# Patient Record
Sex: Female | Born: 1975 | Race: Black or African American | Hispanic: No | Marital: Single | Smoking: Never smoker
Health system: Southern US, Community
[De-identification: ages and names within clinical notes are randomized; demographics above are authoritative.]

## PROBLEM LIST (undated history)

## (undated) ENCOUNTER — Inpatient Hospital Stay (HOSPITAL_COMMUNITY): Payer: Self-pay

## (undated) DIAGNOSIS — N809 Endometriosis, unspecified: Secondary | ICD-10-CM

## (undated) DIAGNOSIS — R519 Headache, unspecified: Secondary | ICD-10-CM

## (undated) DIAGNOSIS — D219 Benign neoplasm of connective and other soft tissue, unspecified: Secondary | ICD-10-CM

## (undated) DIAGNOSIS — D649 Anemia, unspecified: Secondary | ICD-10-CM

## (undated) DIAGNOSIS — N83209 Unspecified ovarian cyst, unspecified side: Secondary | ICD-10-CM

## (undated) DIAGNOSIS — N939 Abnormal uterine and vaginal bleeding, unspecified: Secondary | ICD-10-CM

## (undated) DIAGNOSIS — Z789 Other specified health status: Secondary | ICD-10-CM

## (undated) DIAGNOSIS — K8020 Calculus of gallbladder without cholecystitis without obstruction: Secondary | ICD-10-CM

## (undated) DIAGNOSIS — R51 Headache: Secondary | ICD-10-CM

## (undated) HISTORY — PX: FOOT SURGERY: SHX648

## (undated) HISTORY — DX: Anemia, unspecified: D64.9

## (undated) HISTORY — PX: ABDOMINAL HYSTERECTOMY: SHX81

## (undated) HISTORY — DX: Endometriosis, unspecified: N80.9

## (undated) HISTORY — PX: WISDOM TOOTH EXTRACTION: SHX21

## (undated) HISTORY — DX: Abnormal uterine and vaginal bleeding, unspecified: N93.9

## (undated) HISTORY — DX: Benign neoplasm of connective and other soft tissue, unspecified: D21.9

## (undated) HISTORY — PX: TUBAL LIGATION: SHX77

---
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
## 1999-01-06 ENCOUNTER — Other Ambulatory Visit: Admission: RE | Admit: 1999-01-06 | Discharge: 1999-01-06 | Payer: Self-pay | Admitting: Obstetrics

## 1999-01-31 ENCOUNTER — Inpatient Hospital Stay (HOSPITAL_COMMUNITY): Admission: AD | Admit: 1999-01-31 | Discharge: 1999-01-31 | Payer: Self-pay | Admitting: Obstetrics

## 1999-04-24 ENCOUNTER — Inpatient Hospital Stay (HOSPITAL_COMMUNITY): Admission: AD | Admit: 1999-04-24 | Discharge: 1999-04-27 | Payer: Self-pay | Admitting: Obstetrics

## 1999-04-25 ENCOUNTER — Encounter: Payer: Self-pay | Admitting: Obstetrics

## 1999-07-01 ENCOUNTER — Inpatient Hospital Stay (HOSPITAL_COMMUNITY): Admission: AD | Admit: 1999-07-01 | Discharge: 1999-07-01 | Payer: Self-pay | Admitting: Obstetrics

## 1999-07-15 ENCOUNTER — Inpatient Hospital Stay (HOSPITAL_COMMUNITY): Admission: AD | Admit: 1999-07-15 | Discharge: 1999-07-15 | Payer: Self-pay | Admitting: Obstetrics

## 1999-07-21 ENCOUNTER — Inpatient Hospital Stay (HOSPITAL_COMMUNITY): Admission: AD | Admit: 1999-07-21 | Discharge: 1999-07-21 | Payer: Self-pay | Admitting: Obstetrics

## 1999-08-26 ENCOUNTER — Inpatient Hospital Stay (HOSPITAL_COMMUNITY): Admission: AD | Admit: 1999-08-26 | Discharge: 1999-08-28 | Payer: Self-pay | Admitting: Obstetrics

## 2002-07-08 ENCOUNTER — Emergency Department (HOSPITAL_COMMUNITY): Admission: EM | Admit: 2002-07-08 | Discharge: 2002-07-08 | Payer: Self-pay | Admitting: Emergency Medicine

## 2002-07-08 ENCOUNTER — Encounter: Payer: Self-pay | Admitting: Emergency Medicine

## 2003-02-01 ENCOUNTER — Emergency Department (HOSPITAL_COMMUNITY): Admission: EM | Admit: 2003-02-01 | Discharge: 2003-02-01 | Payer: Self-pay | Admitting: Emergency Medicine

## 2003-02-16 ENCOUNTER — Emergency Department (HOSPITAL_COMMUNITY): Admission: EM | Admit: 2003-02-16 | Discharge: 2003-02-17 | Payer: Self-pay | Admitting: Emergency Medicine

## 2003-02-17 ENCOUNTER — Encounter: Payer: Self-pay | Admitting: Emergency Medicine

## 2005-10-06 ENCOUNTER — Emergency Department (HOSPITAL_COMMUNITY): Admission: EM | Admit: 2005-10-06 | Discharge: 2005-10-06 | Payer: Self-pay | Admitting: Emergency Medicine

## 2011-02-27 ENCOUNTER — Other Ambulatory Visit (HOSPITAL_COMMUNITY): Payer: Self-pay | Admitting: Obstetrics

## 2011-02-27 DIAGNOSIS — Z1231 Encounter for screening mammogram for malignant neoplasm of breast: Secondary | ICD-10-CM

## 2011-02-27 DIAGNOSIS — D219 Benign neoplasm of connective and other soft tissue, unspecified: Secondary | ICD-10-CM

## 2011-03-08 ENCOUNTER — Ambulatory Visit (HOSPITAL_COMMUNITY)
Admission: RE | Admit: 2011-03-08 | Discharge: 2011-03-08 | Disposition: A | Discharge status: Home / Self Care | Payer: BC Managed Care – PPO | Source: Ambulatory Visit | Attending: Obstetrics | Admitting: Obstetrics

## 2011-03-08 DIAGNOSIS — D219 Benign neoplasm of connective and other soft tissue, unspecified: Secondary | ICD-10-CM

## 2011-03-08 DIAGNOSIS — Z1231 Encounter for screening mammogram for malignant neoplasm of breast: Secondary | ICD-10-CM | POA: Insufficient documentation

## 2011-03-14 ENCOUNTER — Other Ambulatory Visit (HOSPITAL_COMMUNITY): Payer: Self-pay | Admitting: Obstetrics

## 2011-03-14 DIAGNOSIS — N83202 Unspecified ovarian cyst, left side: Secondary | ICD-10-CM

## 2011-03-14 DIAGNOSIS — D219 Benign neoplasm of connective and other soft tissue, unspecified: Secondary | ICD-10-CM

## 2011-04-19 ENCOUNTER — Ambulatory Visit (HOSPITAL_COMMUNITY): Payer: BC Managed Care – PPO

## 2011-04-21 ENCOUNTER — Ambulatory Visit (HOSPITAL_COMMUNITY)
Admission: RE | Admit: 2011-04-21 | Discharge: 2011-04-21 | Disposition: A | Discharge status: Home / Self Care | Payer: BC Managed Care – PPO | Source: Ambulatory Visit | Attending: Obstetrics | Admitting: Obstetrics

## 2011-04-21 ENCOUNTER — Other Ambulatory Visit (HOSPITAL_COMMUNITY): Payer: Self-pay | Admitting: Obstetrics

## 2011-04-21 DIAGNOSIS — N83202 Unspecified ovarian cyst, left side: Secondary | ICD-10-CM

## 2011-04-21 DIAGNOSIS — D219 Benign neoplasm of connective and other soft tissue, unspecified: Secondary | ICD-10-CM

## 2011-04-21 DIAGNOSIS — N83209 Unspecified ovarian cyst, unspecified side: Secondary | ICD-10-CM | POA: Insufficient documentation

## 2011-04-21 DIAGNOSIS — O3410 Maternal care for benign tumor of corpus uteri, unspecified trimester: Secondary | ICD-10-CM | POA: Insufficient documentation

## 2011-04-21 DIAGNOSIS — O34599 Maternal care for other abnormalities of gravid uterus, unspecified trimester: Secondary | ICD-10-CM | POA: Insufficient documentation

## 2011-05-11 ENCOUNTER — Encounter (HOSPITAL_COMMUNITY): Payer: Self-pay | Admitting: Obstetrics and Gynecology

## 2011-05-11 ENCOUNTER — Inpatient Hospital Stay (HOSPITAL_COMMUNITY)
Admission: AD | Admit: 2011-05-11 | Discharge: 2011-05-11 | Disposition: A | Discharge status: Home / Self Care | Payer: BC Managed Care – PPO | Source: Ambulatory Visit | Attending: Obstetrics | Admitting: Obstetrics

## 2011-05-11 DIAGNOSIS — O3410 Maternal care for benign tumor of corpus uteri, unspecified trimester: Secondary | ICD-10-CM

## 2011-05-11 DIAGNOSIS — D259 Leiomyoma of uterus, unspecified: Secondary | ICD-10-CM

## 2011-05-11 DIAGNOSIS — O219 Vomiting of pregnancy, unspecified: Secondary | ICD-10-CM

## 2011-05-11 DIAGNOSIS — E860 Dehydration: Secondary | ICD-10-CM | POA: Insufficient documentation

## 2011-05-11 DIAGNOSIS — O211 Hyperemesis gravidarum with metabolic disturbance: Secondary | ICD-10-CM | POA: Insufficient documentation

## 2011-05-11 HISTORY — DX: Other specified health status: Z78.9

## 2011-05-11 LAB — URINALYSIS, ROUTINE W REFLEX MICROSCOPIC
Glucose, UA: NEGATIVE mg/dL
Ketones, ur: 15 mg/dL — AB
Leukocytes, UA: NEGATIVE
Nitrite: NEGATIVE
Protein, ur: NEGATIVE mg/dL

## 2011-05-11 LAB — URINE MICROSCOPIC-ADD ON

## 2011-05-11 MED ORDER — ONDANSETRON 4 MG PO TBDP: 4.0000 mg | ORAL_TABLET | Freq: Once | ORAL | Status: DC

## 2011-05-11 MED ORDER — PROMETHAZINE HCL 25 MG/ML IJ SOLN
25.0000 mg | Freq: Once | INTRAVENOUS | Status: AC
  Administered 2011-05-11: 25 mg via INTRAVENOUS
  Filled 2011-05-11: qty 1

## 2011-05-11 MED ORDER — ONDANSETRON 4 MG PO TBDP
4.0000 mg | ORAL_TABLET | Freq: Once | ORAL | Status: AC
  Administered 2011-05-11: 4 mg via ORAL
  Filled 2011-05-11: qty 1

## 2011-05-11 MED ORDER — ONDANSETRON 4 MG PO TBDP: 4.0000 mg | ORAL_TABLET | Freq: Three times a day (TID) | ORAL | Status: AC | PRN

## 2011-05-11 NOTE — ED Provider Notes (Signed)
Carol L Mathews35 y.o.G3P1011 @[redacted]w[redacted]d  Chief Complaint  Patient presents with  . Emesis During Pregnancy    SUBJECTIVE  HPI: She reports about a two-week history of nausea vomiting and anorexia. She states that she's lost 10 pounds. She is kept nothing down but I shifts for 2 days. She tried a couple of Zofran tablet with some relief of nausea however she continued to vomit when attempting to ingest any solid foods. She feels weak and dizzy. Additionally she feels a lot of vaginal pressure. She has been followed by Dr. Gaynell Face and has had quantitative beta-hCGs done twice with appropriate rise for patient. Ultrasound done at 5 weeks 6 days showed gestational sac and no yolk sac. It was remarkable for multiple intramural large fibroids and one subserosal fibroid.  Past Medical History  Diagnosis Date  . No pertinent past medical history    Ob Hx: Gyn Hx: Past Surgical History  Procedure Date  . Foot surgery    History   Social History  . Marital Status: Single    Spouse Name: N/A    Number of Children: N/A  . Years of Education: N/A   Occupational History  . Not on file.   Social History Main Topics  . Smoking status: Never Smoker   . Smokeless tobacco: Not on file  . Alcohol Use: No  . Drug Use: No  . Sexually Active: Yes   Other Topics Concern  . Not on file   Social History Narrative  . No narrative on file   No current facility-administered medications on file prior to encounter.   No current outpatient prescriptions on file prior to encounter.   No Known Allergies  ROS: Pertinent items in HPI  OBJECTIVE  BP 119/76  Pulse 85  Temp(Src) 98.8 F (37.1 C) (Oral)  Resp 18  Ht 5\' 5"  (1.651 m)  Wt 71.578 kg (157 lb 12.8 oz)  BMI 26.26 kg/m2  LMP 02/19/2011   Results for orders placed during the hospital encounter of 05/11/11 (from the past 24 hour(s))  URINALYSIS, ROUTINE W REFLEX MICROSCOPIC     Status: Abnormal   Collection Time   05/11/11  8:45 PM      Component Value Range   Color, Urine YELLOW  YELLOW    Appearance CLEAR  CLEAR    Specific Gravity, Urine >1.030 (*) 1.005 - 1.030    pH 6.0  5.0 - 8.0    Glucose, UA NEGATIVE  NEGATIVE (mg/dL)   Hgb urine dipstick TRACE (*) NEGATIVE    Bilirubin Urine NEGATIVE  NEGATIVE    Ketones, ur 15 (*) NEGATIVE (mg/dL)   Protein, ur NEGATIVE  NEGATIVE (mg/dL)   Urobilinogen, UA 1.0  0.0 - 1.0 (mg/dL)   Nitrite NEGATIVE  NEGATIVE    Leukocytes, UA NEGATIVE  NEGATIVE   URINE MICROSCOPIC-ADD ON     Status: Abnormal   Collection Time   05/11/11  8:45 PM      Component Value Range   Squamous Epithelial / LPF FEW (*) RARE    WBC, UA 0-2  <3 (WBC/hpf)   Bacteria, UA FEW (*) RARE    Urine-Other MUCOUS PRESENT     Physical Exam:  General: WN/WD, appears fatigued Abd: soft,  Back: neg CVA  MAU course: treated with Zofran 4 mg SL and IV rehydration with 1000 mg LR with 25 mg Phenergan  ASSESSMENT G3P1011 at [redacted]w[redacted]d Symptomatic dehydration due to nausea/vomiting of pregnancy Myomatous uterus    PLAN  Rx Zofran ODT 4mg   May use Tylenol and Tylenol pm for discomfort and discuss with Dr. Gaynell Face on Monday re: using ibuprofen

## 2011-05-11 NOTE — Progress Notes (Signed)
"  I've had dizziness, feeling faint, extreme N/V, no food or liquid intake, chills, pressure, H/S, pain in lower abd.  This has been for 2 weeks.  The last couple of days was worse."

## 2011-05-15 ENCOUNTER — Other Ambulatory Visit (HOSPITAL_COMMUNITY): Payer: Self-pay | Admitting: Obstetrics

## 2011-05-15 DIAGNOSIS — D219 Benign neoplasm of connective and other soft tissue, unspecified: Secondary | ICD-10-CM

## 2011-05-19 ENCOUNTER — Ambulatory Visit (HOSPITAL_COMMUNITY)
Admission: RE | Admit: 2011-05-19 | Discharge: 2011-05-19 | Disposition: A | Discharge status: Home / Self Care | Payer: BC Managed Care – PPO | Source: Ambulatory Visit | Attending: Obstetrics | Admitting: Obstetrics

## 2011-05-19 DIAGNOSIS — O3410 Maternal care for benign tumor of corpus uteri, unspecified trimester: Secondary | ICD-10-CM | POA: Insufficient documentation

## 2011-05-19 DIAGNOSIS — O09529 Supervision of elderly multigravida, unspecified trimester: Secondary | ICD-10-CM | POA: Insufficient documentation

## 2011-05-19 DIAGNOSIS — D219 Benign neoplasm of connective and other soft tissue, unspecified: Secondary | ICD-10-CM

## 2011-05-19 DIAGNOSIS — Z3689 Encounter for other specified antenatal screening: Secondary | ICD-10-CM | POA: Insufficient documentation

## 2011-06-25 ENCOUNTER — Inpatient Hospital Stay (HOSPITAL_COMMUNITY)
Admission: AD | Admit: 2011-06-25 | Discharge: 2011-06-25 | Disposition: A | Discharge status: Home / Self Care | Payer: BC Managed Care – PPO | Source: Ambulatory Visit | Attending: Obstetrics | Admitting: Obstetrics

## 2011-06-25 ENCOUNTER — Encounter (HOSPITAL_COMMUNITY): Payer: Self-pay

## 2011-06-25 DIAGNOSIS — N760 Acute vaginitis: Secondary | ICD-10-CM | POA: Insufficient documentation

## 2011-06-25 DIAGNOSIS — K5900 Constipation, unspecified: Secondary | ICD-10-CM | POA: Insufficient documentation

## 2011-06-25 DIAGNOSIS — O3410 Maternal care for benign tumor of corpus uteri, unspecified trimester: Secondary | ICD-10-CM

## 2011-06-25 DIAGNOSIS — D259 Leiomyoma of uterus, unspecified: Secondary | ICD-10-CM | POA: Insufficient documentation

## 2011-06-25 DIAGNOSIS — R109 Unspecified abdominal pain: Secondary | ICD-10-CM | POA: Insufficient documentation

## 2011-06-25 DIAGNOSIS — A499 Bacterial infection, unspecified: Secondary | ICD-10-CM | POA: Insufficient documentation

## 2011-06-25 DIAGNOSIS — B9689 Other specified bacterial agents as the cause of diseases classified elsewhere: Secondary | ICD-10-CM | POA: Insufficient documentation

## 2011-06-25 HISTORY — DX: Unspecified ovarian cyst, unspecified side: N83.209

## 2011-06-25 HISTORY — DX: Benign neoplasm of connective and other soft tissue, unspecified: D21.9

## 2011-06-25 LAB — URINALYSIS, ROUTINE W REFLEX MICROSCOPIC
Bilirubin Urine: NEGATIVE
Nitrite: NEGATIVE
Specific Gravity, Urine: >1.03 — ABNORMAL HIGH (ref 1.005–1.030)
pH: 6 (ref 5.0–8.0)

## 2011-06-25 MED ORDER — DOCUSATE SODIUM 100 MG PO CAPS: 100.0000 mg | ORAL_CAPSULE | Freq: Two times a day (BID) | ORAL | Status: AC

## 2011-06-25 MED ORDER — METRONIDAZOLE 500 MG PO TABS: 500.0000 mg | ORAL_TABLET | Freq: Two times a day (BID) | ORAL | Status: AC

## 2011-06-25 MED ORDER — POLYETHYLENE GLYCOL 3350 17 G PO PACK: 17.0000 g | PACK | Freq: Every day | ORAL | Status: AC

## 2011-06-25 MED ORDER — IBUPROFEN 600 MG PO TABS
600.0000 mg | ORAL_TABLET | Freq: Once | ORAL | Status: AC
  Administered 2011-06-25: 600 mg via ORAL
  Filled 2011-06-25: qty 1

## 2011-06-25 MED ORDER — OXYCODONE-ACETAMINOPHEN 5-325 MG PO TABS
1.0000 | ORAL_TABLET | ORAL | Status: AC | PRN
Start: 2011-06-25 — End: 2011-07-05

## 2011-06-25 MED ORDER — OXYCODONE-ACETAMINOPHEN 5-325 MG PO TABS
2.0000 | ORAL_TABLET | Freq: Once | ORAL | Status: AC
  Administered 2011-06-25: 2 via ORAL
  Filled 2011-06-25: qty 2

## 2011-06-25 NOTE — Progress Notes (Signed)
Patient is here with c/o constant abdominal sharp pain that started a week ago. Patient states that she did not call her doctor because she was trying to wait for her next appointment next Tuesday. She c/o constipation. Denies any vaginal bleeding or discharge.

## 2011-06-25 NOTE — ED Provider Notes (Signed)
History   MORGANNA STYLES is a 35 y.o. year old G70P1011 female at [redacted]w[redacted]d weeks gestation who presents to MAU reporting mod-severe pain > in upper, left uterus and epigastric region. She also reports increased white vaginal discharge and constipation w/ no BM x 7 days. She has tried dietary changes w/ no relief. She denies contractions, vaginal bleeding or leaking of fluid.Pt has degenerating fundal fibroid per [place] one month ago.    Chief Complaint  Patient presents with  . Abdominal Pain   HPI  OB History    Grav Para Term Preterm Abortions TAB SAB Ect Mult Living   3 1 1  0 1 0 1 0 0 1      Past Medical History  Diagnosis Date  . Fibroids   . Ovarian cyst     Past Surgical History  Procedure Date  . Foot surgery     History reviewed. No pertinent family history.  History  Substance Use Topics  . Smoking status: Never Smoker   . Smokeless tobacco: Not on file  . Alcohol Use: No    Allergies: No Known Allergies  Prescriptions prior to admission  Medication Sig Dispense Refill  . ondansetron (ZOFRAN-ODT) 4 MG disintegrating tablet Take 4 mg by mouth every 8 (eight) hours as needed. For nausea or vomiting         Review of Systems  Constitutional: Negative for fever and chills.  Gastrointestinal: Positive for nausea, abdominal pain and constipation. Negative for heartburn, vomiting and diarrhea.  Genitourinary: Negative for dysuria, urgency, frequency, hematuria and flank pain.       Vaginal discharge   Physical Exam   Blood pressure 128/74, pulse 97, temperature 98.4 F (36.9 C), temperature source Oral, resp. rate 18, height 5\' 5"  (1.651 m), weight 71.838 kg (158 lb 6 oz), last menstrual period 02/19/2011, SpO2 98.00%.  Physical Exam  Nursing note and vitals reviewed. Constitutional: She is oriented to person, place, and time. She appears well-developed and well-nourished. She appears distressed (mod discomfort w/ mvmt).  Cardiovascular: Normal rate.     Respiratory: Effort normal.  GI: Bowel sounds are normal. She exhibits distension and mass (fundal and LLQ fibroids and stool in transverse colon palpated). There is tenderness. There is guarding. There is no rebound.  Genitourinary: Uterus is enlarged (S>D due to fibroids) and tender. Cervix exhibits no motion tenderness, no discharge and no friability. Right adnexum displays no tenderness. Left adnexum displays no tenderness. No bleeding around the vagina. Vaginal discharge (large amount of thin, white, malodorous discharge) found.  Neurological: She is alert and oriented to person, place, and time.  Skin: Skin is warm and dry.  Psychiatric: She has a normal mood and affect.   Cervix long and closed  MAU Course  Procedures  MDM Per consult w/ Dr. Tamela Oddi give pt Ibuprofen and D/C home if adequate pain relief. Rx Tylenol #3 No pain relief w/ Ibuprofen. Will give percocet. Care of pt turned over to Eveline Keto, NP at 0800.  Assessment and Plan  Assessment: 1. Degenerating fibroid 2. BV 3. Constipation  Plan: 1. Rx Percocet, Flagyl 2. Offered enema, declined. Start Miralax and colace. Increase fluids 3. F/U w/ Dr. Gaynell Face 06/27/11 as scheduled   SMITH, [place] 06/25/2011, 8:00 AM   0910 06/25/11 Pt pain is better, husband on his way to pick her up. No further questions. Colin Mulders, [facility]  [place]. Gwynn Crossley 06/25/11 1610

## 2011-07-05 ENCOUNTER — Other Ambulatory Visit: Payer: Self-pay | Admitting: Obstetrics

## 2011-07-05 ENCOUNTER — Inpatient Hospital Stay (HOSPITAL_COMMUNITY): Payer: BC Managed Care – PPO

## 2011-07-05 ENCOUNTER — Encounter (HOSPITAL_COMMUNITY): Payer: Self-pay | Admitting: *Deleted

## 2011-07-05 ENCOUNTER — Inpatient Hospital Stay (HOSPITAL_COMMUNITY)
Admission: AD | Admit: 2011-07-05 | Discharge: 2011-07-06 | DRG: 380 | Disposition: A | Discharge status: Home / Self Care | Payer: BC Managed Care – PPO | Source: Ambulatory Visit | Attending: Obstetrics | Admitting: Obstetrics

## 2011-07-05 DIAGNOSIS — O034 Incomplete spontaneous abortion without complication: Principal | ICD-10-CM | POA: Diagnosis present

## 2011-07-05 LAB — RUBELLA SCREEN: Rubella: 20.8 IU/mL — ABNORMAL HIGH

## 2011-07-05 LAB — CBC
HCT: 32.2 % — ABNORMAL LOW (ref 36.0–46.0)
Hemoglobin: 11.1 g/dL — ABNORMAL LOW (ref 12.0–15.0)
MCHC: 34.5 g/dL (ref 30.0–36.0)

## 2011-07-05 LAB — URINALYSIS, ROUTINE W REFLEX MICROSCOPIC
Hgb urine dipstick: NEGATIVE
Leukocytes, UA: NEGATIVE
Nitrite: NEGATIVE
Specific Gravity, Urine: >1.03 — ABNORMAL HIGH (ref 1.005–1.030)
Urobilinogen, UA: >8 mg/dL — ABNORMAL HIGH (ref 0.0–1.0)

## 2011-07-05 LAB — URINE MICROSCOPIC-ADD ON

## 2011-07-05 LAB — HEPATITIS B SURFACE ANTIGEN: Hepatitis B Surface Ag: NEGATIVE

## 2011-07-05 LAB — TYPE AND SCREEN: Antibody Screen: NEGATIVE

## 2011-07-05 LAB — WET PREP, GENITAL

## 2011-07-05 MED ORDER — DEXTROSE 5 % IN LACTATED RINGERS IV BOLUS
1000.0000 mL | Freq: Once | INTRAVENOUS | Status: AC
  Administered 2011-07-05: 1000 mL via INTRAVENOUS

## 2011-07-05 MED ORDER — MISOPROSTOL 200 MCG PO TABS
800.0000 ug | ORAL_TABLET | Freq: Once | ORAL | Status: AC
  Administered 2011-07-05: 800 ug via RECTAL

## 2011-07-05 MED ORDER — HYDROMORPHONE HCL PF 1 MG/ML IJ SOLN: 2.0000 mg | Freq: Once | INTRAMUSCULAR | Status: DC

## 2011-07-05 MED ORDER — SODIUM CHLORIDE 0.9 % IJ SOLN: 9.0000 mL | INTRAMUSCULAR | Status: DC | PRN

## 2011-07-05 MED ORDER — MISOPROSTOL 200 MCG PO TABS
600.0000 ug | ORAL_TABLET | Freq: Four times a day (QID) | ORAL | Status: DC
  Administered 2011-07-05 (×2): 600 ug via VAGINAL
  Filled 2011-07-05 (×6): qty 3

## 2011-07-05 MED ORDER — DIPHENHYDRAMINE HCL 12.5 MG/5ML PO ELIX
12.5000 mg | ORAL_SOLUTION | Freq: Four times a day (QID) | ORAL | Status: DC | PRN
  Filled 2011-07-05: qty 5

## 2011-07-05 MED ORDER — DIPHENHYDRAMINE HCL 50 MG/ML IJ SOLN: 12.5000 mg | Freq: Four times a day (QID) | INTRAMUSCULAR | Status: DC | PRN

## 2011-07-05 MED ORDER — NALOXONE HCL 0.4 MG/ML IJ SOLN: 0.4000 mg | INTRAMUSCULAR | Status: DC | PRN

## 2011-07-05 MED ORDER — HYDROMORPHONE 0.3 MG/ML IV SOLN
INTRAVENOUS | Status: DC
  Administered 2011-07-05 (×2): via INTRAVENOUS
  Administered 2011-07-05 (×3): 2.7 mg via INTRAVENOUS
  Administered 2011-07-05: 22:00:00 via INTRAVENOUS
  Administered 2011-07-05: 4.8 mg via INTRAVENOUS
  Administered 2011-07-06: 1.8 mg via INTRAVENOUS
  Filled 2011-07-05 (×3): qty 25

## 2011-07-05 MED ORDER — LACTATED RINGERS IV SOLN
INTRAVENOUS | Status: DC
  Administered 2011-07-05 (×3): via INTRAVENOUS

## 2011-07-05 MED ORDER — ONDANSETRON HCL 4 MG/2ML IJ SOLN: 4.0000 mg | Freq: Four times a day (QID) | INTRAMUSCULAR | Status: DC | PRN

## 2011-07-05 NOTE — Progress Notes (Signed)
Pt reports sharp lower abd pain x 3 hours that feels like contractions, EDC 12/16/2011. States she had bloody, mucus in toilet at home.

## 2011-07-05 NOTE — Progress Notes (Addendum)
SVE prior to placing Cytotec per order - fetal parts at introitus.  Spon delivery of non viable female infant at 73, attended by RN.  0/0 apgars.  No apparent anomalies.  Dr. Wilburt Finlay notified of delivery, placenta not delivered.  Baby weighed 4 oz., height 7 inches.

## 2011-07-05 NOTE — Progress Notes (Signed)
New admit, from MAU via stretcher to room 374 under services Dr. Gaynell Face.  Inevitable SAB.  Bed in Trendelenburg position.  Pt positioned to right side for comfort.  SR up x 2.  IV LR inf rt hand, #20G dated 12/12.  VS obtained, plan of care discussed with pt.  Small amt bloody show noted on transfer to bed from stretcher.  Pt states pain 10/10 - lower abd cramping, pressure and tightening.  Report rec'd from transferring RN.

## 2011-07-05 NOTE — Progress Notes (Signed)
Dr. Gaynell Face has been in to evaluate, talk with pt re plan of care.  SVE - placenta not delivered.  Discussed placement of cytotec, orders rec'd.  Cytotec per rectum placed per RN.

## 2011-07-05 NOTE — Progress Notes (Signed)
UR chart review completed.  

## 2011-07-05 NOTE — H&P (Signed)
This is Dr. Francoise Ceo dictating the history and physical on  Carol Logan's a 35 year old gravida 3 para 1011 at 16 weeks and 5 days she has a history of multiple myomas and has been treated for degenerating fibroids she is now 16 weeks 5 days and states that over the past 2 days she has had to wear a mini pad because she's been a wet last night at about 1:00 she started having cramping every 2-3 minutes and then she said she had a partial mucousy-type material came to the hospital and ultrasound showed there was no fluid around the baby and the cervix was basically nonexistent consistent except Foley external os she was having a lot of cramping was started on PCA and since his an inevitable abortion with ruptured membranes Cytotec 600 was inserted in the vagina and the patient in for delivered Past medical history negative Past surgical history negative Social history negative Physical exam revealed a well-developed female complaining of contractions HEENT negative Lungs clear to P&A Heart regular rhythm no murmurs no gallops Abdomen uterus 2022 weeks size irregular with multiple myomas Extremities negative

## 2011-07-05 NOTE — ED Provider Notes (Signed)
History     Chief Complaint  Patient presents with  . Abdominal Pain   HPI 35 y.o. G3P1011 at [redacted]w[redacted]d with sharp low abd pain x 3 hours, "feels like contractions". Reports bloody, mucous discharge in toilet at home. Has multiple large uterine fibroids and degenerating fundal fibroid noted on u/s last month. Was given Flagyl rx on 12/2, but only took a few d/t nausea.    Past Medical History  Diagnosis Date  . Fibroids   . Ovarian cyst     Past Surgical History  Procedure Date  . Foot surgery     History reviewed. No pertinent family history.  History  Substance Use Topics  . Smoking status: Never Smoker   . Smokeless tobacco: Not on file  . Alcohol Use: No    Allergies: No Known Allergies  Prescriptions prior to admission  Medication Sig Dispense Refill  . docusate sodium (COLACE) 100 MG capsule Take 1 capsule (100 mg total) by mouth 2 (two) times daily.  30 capsule  6  . metroNIDAZOLE (FLAGYL) 500 MG tablet Take 1 tablet (500 mg total) by mouth 2 (two) times daily.  14 tablet  0  . ondansetron (ZOFRAN-ODT) 4 MG disintegrating tablet Take 4 mg by mouth every 8 (eight) hours as needed. For nausea or vomiting       . oxyCODONE-acetaminophen (PERCOCET) 5-325 MG per tablet Take 1 tablet by mouth every 4 (four) hours as needed for pain.  30 tablet  0    Review of Systems  Constitutional: Negative.   Respiratory: Negative.   Cardiovascular: Negative.   Gastrointestinal: Positive for nausea, vomiting and abdominal pain. Negative for diarrhea and constipation.  Genitourinary: Negative for dysuria, urgency, frequency, hematuria and flank pain.       Positive for discharge and bleeding  Musculoskeletal: Negative.   Neurological: Negative.   Psychiatric/Behavioral: Negative.    Physical Exam   Blood pressure 116/67, pulse 82, temperature 97.7 F (36.5 C), resp. rate 20, last menstrual period 02/19/2011.  Physical Exam  Nursing note and vitals reviewed. Constitutional:  She is oriented to person, place, and time. She appears well-developed and well-nourished. No distress.  HENT:  Head: Normocephalic and atraumatic.  Cardiovascular: Normal rate.   Respiratory: Effort normal.  GI: Soft. Bowel sounds are normal. She exhibits no mass. There is no tenderness. There is no rebound and no guarding.  Genitourinary: There is no rash or lesion on the right labia. There is no rash or lesion on the left labia. Uterus is enlarged (S>D d/t fibroids) and tender. Cervix exhibits no motion tenderness, no discharge and no friability. Right adnexum displays no mass, no tenderness and no fullness. Left adnexum displays no mass, no tenderness and no fullness. There is bleeding (small) around the vagina. No tenderness around the vagina. Vaginal discharge (copius, thin, yellow, malodorous) found.       SVE: 0/thick/high  Musculoskeletal: Normal range of motion.  Neurological: She is alert and oriented to person, place, and time.  Skin: Skin is warm and dry.  Psychiatric: She has a normal mood and affect.    MAU Course  Procedures  Crist Fat negative  Results for orders placed during the hospital encounter of 07/05/11 (from the past 24 hour(s))  URINALYSIS, ROUTINE W REFLEX MICROSCOPIC     Status: Abnormal   Collection Time   07/05/11  3:27 AM      Component Value Range   Color, Urine YELLOW  YELLOW    APPearance CLEAR  CLEAR  Specific Gravity, Urine >1.030 (*) 1.005 - 1.030    pH 6.5  5.0 - 8.0    Glucose, UA 100 (*) NEGATIVE (mg/dL)   Hgb urine dipstick NEGATIVE  NEGATIVE    Bilirubin Urine SMALL (*) NEGATIVE    Ketones, ur 40 (*) NEGATIVE (mg/dL)   Protein, ur 30 (*) NEGATIVE (mg/dL)   Urobilinogen, UA >1.6 (*) 0.0 - 1.0 (mg/dL)   Nitrite NEGATIVE  NEGATIVE    Leukocytes, UA NEGATIVE  NEGATIVE   URINE MICROSCOPIC-ADD ON     Status: Abnormal   Collection Time   07/05/11  3:27 AM      Component Value Range   Squamous Epithelial / LPF FEW (*) RARE    WBC, UA 0-2  <3  (WBC/hpf)   RBC / HPF 0-2  <3 (RBC/hpf)   Urine-Other MUCOUS PRESENT    WET PREP, GENITAL     Status: Abnormal   Collection Time   07/05/11  3:41 AM      Component Value Range   Yeast, Wet Prep NONE SEEN  NONE SEEN    Trich, Wet Prep NONE SEEN  NONE SEEN    Clue Cells, Wet Prep MODERATE (*) NONE SEEN    WBC, Wet Prep HPF POC TOO NUMEROUS TO COUNT (*) NONE SEEN    U/S: radiologist called report to MAU, AFI= 0, no cervical length, cervix appears open on u/s with fetal parts in cervical canal  Reassessed cervix after return from U/S: closed/thin/? Small parts palpable behind cervix  Assessment and Plan  35 y.o. G3P1011 at [redacted]w[redacted]d PPROM, active cervical change Pt to be admitted to AICU per Dr. Elsie Stain orders  [facility] 07/05/2011, 4:11 AM

## 2011-07-06 LAB — GC/CHLAMYDIA PROBE AMP, GENITAL
Chlamydia, DNA Probe: NEGATIVE
GC Probe Amp, Genital: NEGATIVE

## 2011-07-06 MED ORDER — AMPICILLIN 500 MG PO CAPS
500.0000 mg | ORAL_CAPSULE | Freq: Four times a day (QID) | ORAL | Status: DC
  Administered 2011-07-06 (×2): 500 mg via ORAL
  Filled 2011-07-06 (×3): qty 1

## 2011-07-06 MED ORDER — IBUPROFEN 800 MG PO TABS
800.0000 mg | ORAL_TABLET | ORAL | Status: AC
  Administered 2011-07-06: 800 mg via ORAL
  Filled 2011-07-06: qty 1

## 2011-07-06 NOTE — Discharge Summary (Signed)
  Pt [redacted] wks pregnant membranes spontanously ruptured 2 days pta came in cramping sonar said no fluid present 600 cytuotec inserted intravag twise and non viable fetus passed 738pm foul smelling 800 cytosec inserted per rectum and placenta removed 2 am foul smelling home this am on percocet and ampicillin 500 q 6 hr for 10 days

## 2011-07-06 NOTE — Progress Notes (Signed)
sve prior to insertion cytotec - svd non viable female infant, 0/0 apgars

## 2011-07-06 NOTE — Progress Notes (Signed)
Patient ID: Carol Logan, female   DOB: 06-25-1976, 35 y.o.   MRN: [id number] After receiving Cytotec 600 mics x2 patient past a nonviable fetus at 7:38 PM the placenta was not delivered an 800 mics of Cytotec was inserted per rectum at 2 AM the placenta was removed from the cervix intact nose minimal bleeding uterus 20 week size with myomas

## 2011-07-06 NOTE — Progress Notes (Signed)
Manual removal placenta per Dr. Gaynell Face.  Minimal vaginal bleeding noted.  Orders rec'd.

## 2011-07-07 NOTE — Progress Notes (Signed)
UR chart review completed.  

## 2011-08-28 ENCOUNTER — Other Ambulatory Visit (HOSPITAL_COMMUNITY): Payer: Self-pay | Admitting: Obstetrics

## 2011-08-28 DIAGNOSIS — N719 Inflammatory disease of uterus, unspecified: Secondary | ICD-10-CM

## 2011-08-30 ENCOUNTER — Ambulatory Visit (HOSPITAL_COMMUNITY)
Admission: RE | Admit: 2011-08-30 | Discharge: 2011-08-30 | Disposition: A | Discharge status: Home / Self Care | Payer: 59 | Source: Ambulatory Visit | Attending: Obstetrics | Admitting: Obstetrics

## 2011-08-30 DIAGNOSIS — N719 Inflammatory disease of uterus, unspecified: Secondary | ICD-10-CM

## 2011-08-30 DIAGNOSIS — D259 Leiomyoma of uterus, unspecified: Secondary | ICD-10-CM | POA: Insufficient documentation

## 2011-09-04 ENCOUNTER — Other Ambulatory Visit: Payer: Self-pay | Admitting: Obstetrics

## 2011-09-25 ENCOUNTER — Encounter (HOSPITAL_COMMUNITY): Payer: Self-pay | Admitting: Pharmacist

## 2011-10-09 ENCOUNTER — Encounter (HOSPITAL_COMMUNITY): Payer: Self-pay

## 2011-10-09 ENCOUNTER — Encounter (HOSPITAL_COMMUNITY)
Admission: RE | Admit: 2011-10-09 | Discharge: 2011-10-09 | Disposition: A | Discharge status: Home / Self Care | Payer: 59 | Source: Ambulatory Visit | Attending: Obstetrics | Admitting: Obstetrics

## 2011-10-09 LAB — CBC
Platelets: 230 10*3/uL (ref 150–400)
RDW: 14.8 % (ref 11.5–15.5)
WBC: 5.3 10*3/uL (ref 4.0–10.5)

## 2011-10-09 LAB — DIFFERENTIAL
Basophils Absolute: 0.1 10*3/uL (ref 0.0–0.1)
Eosinophils Relative: 1 % (ref 0–5)
Lymphocytes Relative: 39 % (ref 12–46)
Neutro Abs: 2.7 10*3/uL (ref 1.7–7.7)

## 2011-10-09 LAB — SURGICAL PCR SCREEN: MRSA, PCR: NEGATIVE

## 2011-10-09 NOTE — Patient Instructions (Signed)
YOUR PROCEDURE IS SCHEDULED ON:10/11/11  ENTER THROUGH THE MAIN ENTRANCE OF [facility] AT:7am  USE DESK PHONE AND DIAL [phone] TO INFORM us OF YOUR ARRIVAL  CALL [phone] IF YOU HAVE ANY QUESTIONS OR PROBLEMS PRIOR TO YOUR ARRIVAL.  REMEMBER: DO NOT EAT OR DRINK AFTER MIDNIGHT :Tuesday  SPECIAL INSTRUCTIONS:   YOU MAY BRUSH YOUR TEETH THE MORNING OF SURGERY   TAKE THESE MEDICINES THE DAY OF SURGERY WITH SIP OF WATER:   DO NOT WEAR JEWELRY, EYE MAKEUP, LIPSTICK OR DARK FINGERNAIL POLISH DO NOT WEAR LOTIONS  DO NOT SHAVE FOR 48 HOURS PRIOR TO SURGERY  YOU WILL NOT BE ALLOWED TO DRIVE YOURSELF HOME.  NAME OF DRIVER:Kendrick

## 2011-10-10 NOTE — H&P (Signed)
Carol Logan, Carol Logan             ACCOUNT NO.:  [id number]  MEDICAL RECORD NO.:  [id number]  LOCATION:                                 FACILITY:  PHYSICIAN:  Kathreen Cosier, M.D.DATE OF BIRTH:  1976/02/29  DATE OF ADMISSION:  10/11/2011 DATE OF DISCHARGE:                             HISTORY & PHYSICAL   The patient is a 36 year old, gravida 3, para 1-0-2-1 who had a normal vaginal delivery in 2000.  She had an abortion in 2005.  The patient has a history of myomas and she was pregnant on May 15, 2011, and her uterus was enlarged 20-week size with multiple myomas.  During this pregnancy, she had a lot of pain from her fibroids and on July 03, 2011, started leaking fluid.  The cervix was open and there was no fluid.  She received Cytotec to terminate the pregnancy.  The fetus was foul smelling and postdelivery her myomas were still as large.  Size of her uterus was 20 weeks.  Apart from there being fibroids, there is a suspicion of incompetent cervix, so at this time she is in for multiple myomectomy.  PAST SURGICAL HISTORY:  She had a D and E.  PAST MEDICAL HISTORY:  Negative.  SOCIAL HISTORY:  Negative.  PHYSICAL EXAMINATION:  GENERAL:  A well-developed female, in no distress. HEENT:  Negative. LUNGS:  Clear to P and A. HEART:  Regular rhythm.  No murmurs, no gallops. BREAST:  Negative. ABDOMEN:  Multiple myomas present with uterus up to [redacted] weeks gestational size.  Cervix closed.  Pap smear negative.  Vagina, external genitalia normal. EXTREMITIES:  Negative.          ______________________________ Kathreen Cosier, M.D.     BAM/MEDQ  D:  10/09/2011  T:  10/09/2011  Job:  [id number]

## 2011-10-11 ENCOUNTER — Encounter (HOSPITAL_COMMUNITY): Payer: Self-pay | Admitting: *Deleted

## 2011-10-11 ENCOUNTER — Encounter (HOSPITAL_COMMUNITY)
Admission: RE | Disposition: A | Discharge status: Home / Self Care | Payer: Self-pay | Source: Ambulatory Visit | Attending: Obstetrics

## 2011-10-11 ENCOUNTER — Inpatient Hospital Stay (HOSPITAL_COMMUNITY): Payer: 59 | Admitting: Anesthesiology

## 2011-10-11 ENCOUNTER — Inpatient Hospital Stay (HOSPITAL_COMMUNITY)
Admission: RE | Admit: 2011-10-11 | Discharge: 2011-10-14 | DRG: 742 | Disposition: A | Discharge status: Home / Self Care | Payer: 59 | Source: Ambulatory Visit | Attending: Obstetrics | Admitting: Obstetrics

## 2011-10-11 ENCOUNTER — Encounter (HOSPITAL_COMMUNITY): Payer: Self-pay | Admitting: Anesthesiology

## 2011-10-11 DIAGNOSIS — Z01812 Encounter for preprocedural laboratory examination: Secondary | ICD-10-CM

## 2011-10-11 DIAGNOSIS — D259 Leiomyoma of uterus, unspecified: Principal | ICD-10-CM | POA: Diagnosis present

## 2011-10-11 DIAGNOSIS — D62 Acute posthemorrhagic anemia: Secondary | ICD-10-CM | POA: Diagnosis not present

## 2011-10-11 DIAGNOSIS — Z01818 Encounter for other preprocedural examination: Secondary | ICD-10-CM

## 2011-10-11 HISTORY — PX: MYOMECTOMY: SHX85

## 2011-10-11 LAB — TYPE AND SCREEN
ABO/RH(D): A POS
Antibody Screen: NEGATIVE

## 2011-10-11 LAB — COMPREHENSIVE METABOLIC PANEL
Albumin: 3.4 g/dL — ABNORMAL LOW (ref 3.5–5.2)
Alkaline Phosphatase: 64 U/L (ref 39–117)
BUN: 14 mg/dL (ref 6–23)
Calcium: 9.8 mg/dL (ref 8.4–10.5)
Potassium: 4.4 mEq/L (ref 3.5–5.1)
Sodium: 137 mEq/L (ref 135–145)
Total Protein: 7.2 g/dL (ref 6.0–8.3)

## 2011-10-11 LAB — PREGNANCY, URINE: Preg Test, Ur: NEGATIVE

## 2011-10-11 SURGERY — MYOMECTOMY, ABDOMINAL APPROACH
Anesthesia: General | Site: Abdomen | Wound class: Clean

## 2011-10-11 MED ORDER — MIDAZOLAM HCL 5 MG/5ML IJ SOLN
INTRAMUSCULAR | Status: DC | PRN
  Administered 2011-10-11: 2 mg via INTRAVENOUS

## 2011-10-11 MED ORDER — ROCURONIUM BROMIDE 100 MG/10ML IV SOLN
INTRAVENOUS | Status: DC | PRN
  Administered 2011-10-11: 10 mg via INTRAVENOUS
  Administered 2011-10-11: 30 mg via INTRAVENOUS
  Administered 2011-10-11 (×2): 10 mg via INTRAVENOUS

## 2011-10-11 MED ORDER — HYDROMORPHONE HCL PF 1 MG/ML IJ SOLN
INTRAMUSCULAR | Status: AC
  Filled 2011-10-11: qty 1

## 2011-10-11 MED ORDER — OXYCODONE-ACETAMINOPHEN 5-325 MG PO TABS
1.0000 | ORAL_TABLET | ORAL | Status: DC | PRN
  Administered 2011-10-12 – 2011-10-13 (×4): 2 via ORAL
  Filled 2011-10-11 (×5): qty 2

## 2011-10-11 MED ORDER — ZOLPIDEM TARTRATE 5 MG PO TABS: 5.0000 mg | ORAL_TABLET | Freq: Every evening | ORAL | Status: DC | PRN

## 2011-10-11 MED ORDER — CEFAZOLIN SODIUM 1-5 GM-% IV SOLN
1.0000 g | Freq: Once | INTRAVENOUS | Status: AC
  Administered 2011-10-11: 1 g via INTRAVENOUS

## 2011-10-11 MED ORDER — KETOROLAC TROMETHAMINE 30 MG/ML IJ SOLN
30.0000 mg | Freq: Once | INTRAMUSCULAR | Status: AC
  Administered 2011-10-11: 30 mg via INTRAVENOUS

## 2011-10-11 MED ORDER — MEPERIDINE HCL 25 MG/ML IJ SOLN: 6.2500 mg | INTRAMUSCULAR | Status: DC | PRN

## 2011-10-11 MED ORDER — ONDANSETRON HCL 4 MG/2ML IJ SOLN
INTRAMUSCULAR | Status: DC | PRN
  Administered 2011-10-11: 4 mg via INTRAVENOUS

## 2011-10-11 MED ORDER — HYDROMORPHONE HCL PF 1 MG/ML IJ SOLN
INTRAMUSCULAR | Status: DC | PRN
  Administered 2011-10-11 (×3): 1 mg via INTRAVENOUS

## 2011-10-11 MED ORDER — LACTATED RINGERS IV SOLN
INTRAVENOUS | Status: DC
  Administered 2011-10-11: 18:00:00 via INTRAVENOUS

## 2011-10-11 MED ORDER — GLYCOPYRROLATE 0.2 MG/ML IJ SOLN
INTRAMUSCULAR | Status: DC | PRN
  Administered 2011-10-11: 0.1 mg via INTRAVENOUS
  Administered 2011-10-11: .8 mg via INTRAVENOUS

## 2011-10-11 MED ORDER — KETOROLAC TROMETHAMINE 30 MG/ML IJ SOLN
INTRAMUSCULAR | Status: AC
  Administered 2011-10-11: 30 mg via INTRAVENOUS
  Filled 2011-10-11: qty 1

## 2011-10-11 MED ORDER — 0.9 % SODIUM CHLORIDE (POUR BTL) OPTIME
TOPICAL | Status: DC | PRN
  Administered 2011-10-11: 1000 mL

## 2011-10-11 MED ORDER — LIDOCAINE HCL (CARDIAC) 20 MG/ML IV SOLN
INTRAVENOUS | Status: DC | PRN
  Administered 2011-10-11: 60 mg via INTRAVENOUS

## 2011-10-11 MED ORDER — LACTATED RINGERS IV SOLN
INTRAVENOUS | Status: DC
  Administered 2011-10-11 (×5): via INTRAVENOUS

## 2011-10-11 MED ORDER — KETOROLAC TROMETHAMINE 30 MG/ML IJ SOLN
30.0000 mg | Freq: Four times a day (QID) | INTRAMUSCULAR | Status: DC
  Administered 2011-10-11 – 2011-10-14 (×8): 30 mg via INTRAVENOUS
  Filled 2011-10-11 (×8): qty 1

## 2011-10-11 MED ORDER — NEOSTIGMINE METHYLSULFATE 1 MG/ML IJ SOLN
INTRAMUSCULAR | Status: DC | PRN
  Administered 2011-10-11: 5 mg via INTRAVENOUS

## 2011-10-11 MED ORDER — HYDROMORPHONE HCL PF 1 MG/ML IJ SOLN
0.2000 mg | INTRAMUSCULAR | Status: DC | PRN
  Administered 2011-10-11 – 2011-10-12 (×2): 0.6 mg via INTRAVENOUS
  Filled 2011-10-11 (×2): qty 1

## 2011-10-11 MED ORDER — HYDROMORPHONE HCL PF 1 MG/ML IJ SOLN
0.2500 mg | INTRAMUSCULAR | Status: DC | PRN
  Administered 2011-10-11 (×2): 0.5 mg via INTRAVENOUS

## 2011-10-11 MED ORDER — DEXAMETHASONE SODIUM PHOSPHATE 10 MG/ML IJ SOLN
INTRAMUSCULAR | Status: DC | PRN
  Administered 2011-10-11: 10 mg via INTRAVENOUS

## 2011-10-11 MED ORDER — KETOROLAC TROMETHAMINE 30 MG/ML IJ SOLN: 30.0000 mg | Freq: Four times a day (QID) | INTRAMUSCULAR | Status: DC

## 2011-10-11 MED ORDER — PROPOFOL 10 MG/ML IV EMUL
INTRAVENOUS | Status: DC | PRN
  Administered 2011-10-11: 150 mg via INTRAVENOUS
  Administered 2011-10-11: 50 mg via INTRAVENOUS

## 2011-10-11 MED ORDER — METOCLOPRAMIDE HCL 5 MG/ML IJ SOLN: 10.0000 mg | Freq: Once | INTRAMUSCULAR | Status: DC | PRN

## 2011-10-11 MED ORDER — IBUPROFEN 800 MG PO TABS
800.0000 mg | ORAL_TABLET | Freq: Three times a day (TID) | ORAL | Status: DC | PRN
  Administered 2011-10-12 (×2): 800 mg via ORAL
  Filled 2011-10-11 (×2): qty 1

## 2011-10-11 MED ORDER — FENTANYL CITRATE 0.05 MG/ML IJ SOLN
INTRAMUSCULAR | Status: DC | PRN
  Administered 2011-10-11 (×2): 50 ug via INTRAVENOUS
  Administered 2011-10-11 (×2): 100 ug via INTRAVENOUS
  Administered 2011-10-11: 50 ug via INTRAVENOUS

## 2011-10-11 MED ORDER — ONDANSETRON 4 MG PO TBDP
4.0000 mg | ORAL_TABLET | Freq: Four times a day (QID) | ORAL | Status: DC | PRN
  Administered 2011-10-11: 4 mg via ORAL
  Filled 2011-10-11: qty 1

## 2011-10-11 SURGICAL SUPPLY — 41 items
ADH SKN CLS APL DERMABOND .7 (GAUZE/BANDAGES/DRESSINGS) ×2
BARRIER ADHS 3X4 INTERCEED (GAUZE/BANDAGES/DRESSINGS) ×2 IMPLANT
BRR ADH 4X3 ABS CNTRL BYND (GAUZE/BANDAGES/DRESSINGS) ×2
CANISTER SUCTION 2500CC (MISCELLANEOUS) ×2 IMPLANT
CHLORAPREP W/TINT 26ML (MISCELLANEOUS) ×2 IMPLANT
CLOTH BEACON ORANGE TIMEOUT ST (SAFETY) ×2 IMPLANT
CONT PATH 16OZ SNAP LID 3702 (MISCELLANEOUS) ×2 IMPLANT
DECANTER SPIKE VIAL GLASS SM (MISCELLANEOUS) ×2 IMPLANT
DERMABOND ADVANCED (GAUZE/BANDAGES/DRESSINGS) ×2
DERMABOND ADVANCED .7 DNX12 (GAUZE/BANDAGES/DRESSINGS) IMPLANT
GAUZE SPONGE 4X4 16PLY XRAY LF (GAUZE/BANDAGES/DRESSINGS) ×2 IMPLANT
GLOVE BIO SURGEON STRL SZ8 (GLOVE) ×1 IMPLANT
GLOVE BIO SURGEON STRL SZ8.5 (GLOVE) ×4 IMPLANT
GLOVE BIOGEL PI IND STRL 7.0 (GLOVE) IMPLANT
GLOVE BIOGEL PI INDICATOR 7.0 (GLOVE) ×2
GOWN PREVENTION PLUS LG XLONG (DISPOSABLE) ×4 IMPLANT
GOWN PREVENTION PLUS XXLARGE (GOWN DISPOSABLE) ×2 IMPLANT
NDL HYPO 25X1 1.5 SAFETY (NEEDLE) ×1 IMPLANT
NEEDLE HYPO 25X1 1.5 SAFETY (NEEDLE) ×2 IMPLANT
NS IRRIG 1000ML POUR BTL (IV SOLUTION) ×2 IMPLANT
PACK ABDOMINAL GYN (CUSTOM PROCEDURE TRAY) ×2 IMPLANT
PAD OB MATERNITY 4.3X12.25 (PERSONAL CARE ITEMS) ×2 IMPLANT
SPONGE LAP 18X18 X RAY DECT (DISPOSABLE) ×4 IMPLANT
STAPLER VISISTAT 35W (STAPLE) ×2 IMPLANT
SUT CHROMIC 0 CT 1 (SUTURE) ×1 IMPLANT
SUT CHROMIC 1 (SUTURE) IMPLANT
SUT CHROMIC 1 CT1 27 (SUTURE) ×4 IMPLANT
SUT CHROMIC 1 CTX 36 (SUTURE) ×6 IMPLANT
SUT CHROMIC 1MO 4 18 CR8 (SUTURE) ×6 IMPLANT
SUT CHROMIC 2 0 SH (SUTURE) ×4 IMPLANT
SUT MON AB 4-0 PS1 27 (SUTURE) ×2 IMPLANT
SUT PLAIN 2 0 XLH (SUTURE) IMPLANT
SUT PLAIN 3 0 TIES 3 18 (SUTURE) IMPLANT
SUT VIC AB 0 CT1 18XCR BRD8 (SUTURE) IMPLANT
SUT VIC AB 0 CT1 27 (SUTURE) ×2
SUT VIC AB 0 CT1 27XBRD ANBCTR (SUTURE) ×1 IMPLANT
SUT VIC AB 0 CT1 8-18 (SUTURE)
SYR CONTROL 10ML LL (SYRINGE) ×2 IMPLANT
TOWEL OR 17X24 6PK STRL BLUE (TOWEL DISPOSABLE) ×4 IMPLANT
TRAY FOLEY CATH 14FR (SET/KITS/TRAYS/PACK) ×2 IMPLANT
WATER STERILE IRR 1000ML POUR (IV SOLUTION) ×2 IMPLANT

## 2011-10-11 NOTE — Transfer of Care (Signed)
Immediate Anesthesia Transfer of Care Note  Patient: Carol Logan  Procedure(s) Performed: Procedure(s) (LRB): MYOMECTOMY (N/A)  Patient Location: PACU  Anesthesia Type: General  Level of Consciousness: awake, alert  and oriented  Airway & Oxygen Therapy: Patient Spontanous Breathing and Patient connected to nasal cannula oxygen  Post-op Assessment: Report given to PACU RN and Post -op Vital signs reviewed and stable  Post vital signs: Reviewed and stable  Complications: No apparent anesthesia complications

## 2011-10-11 NOTE — Anesthesia Postprocedure Evaluation (Signed)
  Anesthesia Post-op Note  Patient: Carol Logan  Procedure(s) Performed: Procedure(s) (LRB): MYOMECTOMY (N/A)  Patient Location: PACU  Anesthesia Type: General  Level of Consciousness: awake, alert  and oriented  Airway and Oxygen Therapy: Patient Spontanous Breathing  Post-op Pain: mild  Post-op Assessment: Post-op Vital signs reviewed, Patient's Cardiovascular Status Stable, Respiratory Function Stable, Patent Airway, No signs of Nausea or vomiting and Pain level controlled  Post-op Vital Signs: Reviewed and stable  Complications: No apparent anesthesia complications

## 2011-10-11 NOTE — Anesthesia Preprocedure Evaluation (Signed)
Anesthesia Evaluation  Patient identified by MRN, date of birth, ID band Patient awake    Reviewed: Allergy & Precautions, H&P , NPO status , Patient's Chart, lab work & pertinent test results, reviewed documented beta blocker date and time   Airway Mallampati: I TM Distance: >3 FB Neck ROM: full    Dental  (+) Teeth Intact   Pulmonary neg pulmonary ROS,  breath sounds clear to auscultation  Pulmonary exam normal       Cardiovascular negative cardio ROS      Neuro/Psych negative neurological ROS  negative psych ROS   GI/Hepatic negative GI ROS, Neg liver ROS,   Endo/Other  negative endocrine ROS  Renal/GU negative Renal ROS  negative genitourinary   Musculoskeletal negative musculoskeletal ROS (+)   Abdominal Normal abdominal exam  (+)   Peds negative pediatric ROS (+)  Hematology negative hematology ROS (+)   Anesthesia Other Findings   Reproductive/Obstetrics negative OB ROS                           Anesthesia Physical Anesthesia Plan  ASA: I  Anesthesia Plan: General   Post-op Pain Management:    Induction: Intravenous  Airway Management Planned: Oral ETT  Additional Equipment:   Intra-op Plan:   Post-operative Plan:   Informed Consent: I have reviewed the patients History and Physical, chart, labs and discussed the procedure including the risks, benefits and alternatives for the proposed anesthesia with the patient or authorized representative who has indicated his/her understanding and acceptance.   Dental Advisory Given  Plan Discussed with: CRNA and Surgeon  Anesthesia Plan Comments:         Anesthesia Quick Evaluation

## 2011-10-11 NOTE — Anesthesia Procedure Notes (Signed)
Procedure Name: Intubation Date/Time: 10/11/2011 8:37 AM Performed by: Graciela Husbands Pre-anesthesia Checklist: Suction available, Emergency Drugs available, Timeout performed, Patient identified and Patient being monitored Patient Re-evaluated:Patient Re-evaluated prior to inductionOxygen Delivery Method: Circle system utilized Preoxygenation: Pre-oxygenation with 100% oxygen Intubation Type: IV induction Ventilation: Mask ventilation without difficulty Laryngoscope Size: Mac and 3 Grade View: Grade I Tube type: Oral Tube size: 7.0 mm Number of attempts: 1 Airway Equipment and Method: Stylet Placement Confirmation: ETT inserted through vocal cords under direct vision,  positive ETCO2 and breath sounds checked- equal and bilateral Secured at: 21 cm Tube secured with: Tape Dental Injury: Teeth and Oropharynx as per pre-operative assessment

## 2011-10-11 NOTE — Interval H&P Note (Signed)
History and Physical Interval Note:  10/11/2011 7:05 AM  Carol Logan  has presented today for surgery, with the diagnosis of myoma uteri  The various methods of treatment have been discussed with the patient and family. After consideration of risks, benefits and other options for treatment, the patient has consented to  Procedure(s) (LRB): MYOMECTOMY (N/A) as a surgical intervention .  The patients' history has been reviewed, patient examined, no change in status, stable for surgery.  I have reviewed the patients' chart and labs.  Questions were answered to the patient's satisfaction.     [name]

## 2011-10-11 NOTE — Op Note (Signed)
preop diagnosis myoma uteri Postop diagnosis the same Procedure multiple myomectomy Surgeon Dr. Francoise Ceo First Asst. Dr. Laural Benes output Procedure patient placed on the operating him in supine position abdomen prepped and draped data and to the Foley catheter transverse suprapubic incision made carried down to the rectus fascia fascia cleaned and incised the length of the incision recti muscles retracted laterally peritoneum incised longitudinally she was noted 16 week size multiple myomas and the uterus was delivered there were 3 large fundal myoma and 2 small a myomas in the insertion of the left tube there were some other myomas posteriorly and anterior using the scalpel an incision was made across the top of the fundal myomas and a using the [place] myomas were removed care was taken not to disrupt the insertion of the tubes into the uterus 2 small myomas that were near the insertion on the left her not removed some anterior and posterior myomas were also removed and hemostasis was achieved with deep sutures of 0 chromic and then the serosa closed with interrupted sutures of 0 chromic the blood loss was 8 900 cc hemostasis satisfactory the tubes and ovaries appeared normal and they abdomen was then closed in layers peritoneum continuous with 2-0 chromic fascia continuous with of 0 Dexon skin shows a subcuticular stitch of 4-0 Monocryl patient tolerated the procedure well taken to recovery room in good condition

## 2011-10-11 NOTE — H&P (Signed)
  There has been no change in her history or physical examination since prior dictation

## 2011-10-12 ENCOUNTER — Encounter (HOSPITAL_COMMUNITY): Payer: Self-pay | Admitting: Obstetrics

## 2011-10-12 LAB — CBC
MCV: 81.6 fL (ref 78.0–100.0)
Platelets: 156 10*3/uL (ref 150–400)
RDW: 14.8 % (ref 11.5–15.5)
WBC: 12.1 10*3/uL — ABNORMAL HIGH (ref 4.0–10.5)

## 2011-10-12 MED ORDER — FERROUS SULFATE 325 (65 FE) MG PO TABS
325.0000 mg | ORAL_TABLET | Freq: Two times a day (BID) | ORAL | Status: DC
  Administered 2011-10-12 – 2011-10-13 (×4): 325 mg via ORAL
  Filled 2011-10-12 (×4): qty 1

## 2011-10-12 MED ORDER — MENTHOL 3 MG MT LOZG
1.0000 | LOZENGE | OROMUCOSAL | Status: DC | PRN
  Administered 2011-10-12 (×2): 3 mg via ORAL
  Filled 2011-10-12: qty 9

## 2011-10-12 NOTE — Addendum Note (Signed)
Addendum  created 10/12/11 1018 by Shanon Payor, CRNA   Modules edited:Notes Section

## 2011-10-12 NOTE — Progress Notes (Signed)
CRITICAL VALUE ALERT  Critical value received:  Hgb=6.6  Date of notification:  10-12-11  Time of notification:  0555  Critical value read back:yes  Nurse who received alert:  Foye Clock RN   MD notified (1st page):  Dr. Gaynell Face   Time of first page:  [phone]  MD notified (2nd page):  Time of second page:  Responding MD: dr. Gaynell Face   Time MD responded:  [phone]

## 2011-10-12 NOTE — Progress Notes (Signed)
Patient ID: Carol Logan, female   DOB: January 16, 1976, 36 y.o.   MRN: [id number] Postoperative day #1 Blood pressure 100  /67 pulse 102 output good hemoglobin 6.6 patient loss 900 cc of the time of surgery abdomen nice and soft bowel sounds present is Dr. Regular that today and ferrous sulfate 325 by mouth twice a day patient is anemic but asymptomatic doing well

## 2011-10-12 NOTE — Anesthesia Postprocedure Evaluation (Signed)
  Anesthesia Post-op Note  Patient: Carol Logan  Procedure(s) Performed: Procedure(s) (LRB): MYOMECTOMY (N/A)  Patient Location: [facility]  Anesthesia Type: General  Level of Consciousness: awake, alert  and oriented  Airway and Oxygen Therapy: Patient Spontanous Breathing  Post-op Pain: none  Post-op Assessment: Post-op Vital signs reviewed and Patient's Cardiovascular Status Stable  Post-op Vital Signs: Reviewed and stable  Complications: No apparent anesthesia complications

## 2011-10-12 NOTE — Progress Notes (Signed)
UR Chart review completed.  

## 2011-10-13 NOTE — Progress Notes (Signed)
Patient ID: Carol Logan, female   DOB: 29-Jun-1976, 36 y.o.   MRN: [id number] Postpartum day 2 Vital signs normal Abdomen soft good bowel sounds Has not passed gas or had a bowel movement Legs negative No complaints doing well

## 2011-10-14 DIAGNOSIS — D259 Leiomyoma of uterus, unspecified: Principal | ICD-10-CM | POA: Diagnosis present

## 2011-10-14 DIAGNOSIS — D62 Acute posthemorrhagic anemia: Secondary | ICD-10-CM | POA: Diagnosis not present

## 2011-10-14 LAB — HEMOGLOBIN AND HEMATOCRIT, BLOOD: Hemoglobin: 6.2 g/dL — CL (ref 12.0–15.0)

## 2011-10-14 MED ORDER — OXYCODONE-ACETAMINOPHEN 5-325 MG PO TABS: 1.0000 | ORAL_TABLET | ORAL | Status: AC | PRN

## 2011-10-14 NOTE — Discharge Instructions (Addendum)
Myomectomy Care After  Refer to this sheet in the next few weeks. These instructions provide you with information on caring for yourself after your procedure. Your caregiver may also give you specific instructions. Your treatment has been planned according to current medical practices, but problems sometimes occur. Call your caregiver if you have any problems or questions after your procedure. HOME CARE INSTRUCTIONS   Only take over-the-counter or prescription medicines for pain, discomfort, or fever as directed by your caregiver. Avoid aspirin because it can cause bleeding.   Do not douche, use tampons, or have intercourse until given permission to by your caregiver.   Change your bandage (dressing) as directed.   Do not drive until you are given permission to by your caregiver.   Take showers instead of baths as directed by your caregiver.   If you become constipated, you may take a mild laxative with your caregiver's permission. Eat more bran and drink enough fluids to keep your urine clear or pale yellow.   Take your temperature twice a day and write it down.   Do not drink alcohol.   Do not drive while on pain medicine (narcotics).   Have help at home for 1 week, or until you can do your own household activities.   Keep all follow-up appointments with your caregiver.  SEEK MEDICAL CARE IF:  You develop a temperature of 100 F (37.8 C) or higher.   You have increasing stomach pain and medicine does not help.   You have nausea, vomiting, or diarrhea.   You have pain when you urinate, or you have blood in your urine.   You have a rash on your body.   You have pain or redness where your intravenous (IV) access tube was inserted.   You develop weakness or lightheadedness.   You need stronger pain medicine.   You have a reaction or side effects from your medicines.  SEEK IMMEDIATE MEDICAL CARE IF:   You have pain, swelling, or any kind of drainage from your incision.    You have pain, swelling, or redness in your leg.   You have chest pain.   You faint.   You have shortness of breath.   You have heavy vaginal bleeding.   You see pus coming from the incision.   Your incision is opening up.  MAKE SURE YOU:  Understand these instructions.   Will watch your condition.   Will get help right away if you are not doing well or get worse.  Document Released: 11/30/2010 Document Revised: 06/29/2011 Document Reviewed: 11/30/2010 [facility] Patient Information 2012 [place], [place].  Iron Deficiency Anemia There are many types of anemia. Iron deficiency anemia is the most common. Iron deficiency anemia is a decrease in the number of red blood cells caused by too little iron. Without enough iron, your body does not produce enough hemoglobin. Hemoglobin is a substance in red blood cells that carries oxygen to the body's tissues. Iron deficiency anemia may leave you tired and short of breath. CAUSES   Lack of iron in the diet.   This may be seen in infants and children, because there is little iron in milk.   This may be seen in adults who do not eat enough iron-rich foods.   This may be seen in pregnant or breastfeeding women who do not take iron supplements. There is a much higher need for iron intake at these times.   Poor absorption of iron, as seen with intestinal disorders.  Intestinal bleeding.   Heavy periods.  SYMPTOMS  Mild anemia may not be noticeable. Symptoms may include:  Fatigue.   Headache.   Pale skin.   Weakness.   Shortness of breath.   Dizziness.   Cold hands and feet.   Fast or irregular heartbeat.  DIAGNOSIS  Diagnosis requires a thorough evaluation and physical exam by your caregiver.  Blood tests are generally used to confirm iron deficiency anemia.   Additional tests may be done to find the underlying cause of your anemia. These may include:   Testing for blood in the stool (fecal occult blood test).   A  procedure to see inside the colon and rectum (colonoscopy).   A procedure to see inside the esophagus and stomach (endoscopy).  TREATMENT   Correcting the cause of the iron deficiency is the first step.   Medicines, such as oral contraceptives, can make heavy menstrual flows lighter.   Antibiotics and other medicines can be used to treat peptic ulcers.   Surgery may be needed to remove a bleeding polyp, tumor, or fibroid.   Often, iron supplements (ferrous sulfate) are taken.   For the best iron absorption, take these supplements with an empty stomach.   You may need to take the supplements with food if you cannot tolerate them on an empty stomach. Vitamin C improves the absorption of iron. Your caregiver may recommend taking your iron tablets with a glass of orange juice or vitamin C supplement.   Milk and antacids should not be taken at the same time as iron supplements. They may interfere with the absorption of iron.   Iron supplements can cause constipation. A stool softener is often recommended.   Pregnant and breastfeeding women will need to take extra iron, because their normal diet usually will not provide the required amount.   Patients who cannot tolerate iron by mouth can take it through a vein (intravenously) or by an injection into the muscle.  HOME CARE INSTRUCTIONS   Ask your dietitian for help with diet questions.   Take iron and vitamins as directed by your caregiver.   Eat a diet rich in iron. Eat liver, lean beef, whole-grain bread, eggs, dried fruit, and dark green leafy vegetables.  SEEK IMMEDIATE MEDICAL CARE IF:   You have a fainting episode. Do not drive yourself. Call your local emergency services (911 in [place] if no other help is available.   You have chest pain, nausea, or vomiting.   You develop severe or increased shortness of breath with activities.   You develop weakness or increased thirst.   You have a rapid heartbeat.   You develop  unexplained sweating or become lightheaded when getting up from a chair or bed.  MAKE SURE YOU:   Understand these instructions.   Will watch your condition.   Will get help right away if you are not doing well or get worse.  Document Released: 07/07/2000 Document Revised: 06/29/2011 Document Reviewed: 11/16/2009 [facility] Patient Information 2012 [place], [place].

## 2011-10-14 NOTE — Discharge Summary (Signed)
  Physician Discharge Summary  Patient ID: Carol Logan MRN: [id number] DOB/AGE: 1975-12-08 36 y.o.  Admit date: 10/11/2011 Discharge date: 10/14/2011  Admission Diagnoses:  Leiomyoma of uterus  Discharge Diagnoses:  Active Problems:  Leiomyoma of uterus, unspecified  Anemia due to blood loss, acute   Discharged Condition: good  Hospital Course: The patient underwent multiple abdominal myomectomies.  The hospital course was complicated by intraoperative bleeding.  A postoperative hemoglobin was in the 6 range.  She remained hemodynamically stable.  She was discharged to home on postoperative day #3 tolerating a regular diet.  Consults: None  Significant Diagnostic Studies: none  Treatments: surgery: see above  Discharge Exam: Blood pressure 103/68, pulse 91, temperature 99.1 F (37.3 C), temperature source Oral, resp. rate 18, height 5\' 5"  (1.651 m), weight 80.74 kg (178 lb), last menstrual period 02/19/2011, SpO2 100.00%, unknown if currently breastfeeding. General appearance: alert GI: soft, non-tender; bowel sounds normal; no masses,  no organomegaly Extremities: extremities normal, atraumatic, no cyanosis or edema Incision/Wound:  C/D/I  Disposition: 01-Home or Self Care  Discharge Orders    Future Orders Please Complete By Expires   Diet - low sodium heart healthy      Increase activity slowly      May walk up steps      May shower / Bathe      Comments:   No tub baths for 6 weeks   Driving Restrictions      Comments:   No driving for 1- 2 weeks   Lifting restrictions      Comments:   No lifting > 30 lbs for 6 weeks   Sexual Activity Restrictions      Comments:   No intercourse for 6 - 8 weeks   Discharge wound care:      Comments:   Keep clean and dry   Call MD for:  temperature >100.4      Call MD for:  persistant nausea and vomiting      Call MD for:  severe uncontrolled pain      Call MD for:  redness, tenderness, or signs of infection (pain,  swelling, redness, odor or green/yellow discharge around incision site)      Call MD for:  persistant dizziness or light-headedness      Call MD for:  extreme fatigue        Medication List  As of 10/14/2011 11:14 AM   STOP taking these medications         diphenhydramine-acetaminophen 25-500 MG Tabs      medroxyPROGESTERone 150 MG/ML injection         TAKE these medications         oxyCODONE-acetaminophen 5-325 MG per tablet   Commonly known as: PERCOCET   Take 1-2 tablets by mouth every 3 (three) hours as needed (moderate to severe pain (when tolerating fluids)).           Follow-up Information    Schedule an appointment as soon as possible for a visit with Kathreen Cosier, MD.   Contact information:   [address] [place] [address] [phone]          Signed: Roseanna Rainbow 10/14/2011, 11:14 AM

## 2012-04-25 ENCOUNTER — Ambulatory Visit (INDEPENDENT_AMBULATORY_CARE_PROVIDER_SITE_OTHER): Payer: 59 | Admitting: Emergency Medicine

## 2012-04-25 VITALS — BP 134/89 | HR 93 | Temp 98.4°F | Resp 16 | Ht 65.5 in | Wt 197.2 lb

## 2012-04-25 DIAGNOSIS — M654 Radial styloid tenosynovitis [de Quervain]: Secondary | ICD-10-CM

## 2012-04-25 DIAGNOSIS — M79609 Pain in unspecified limb: Secondary | ICD-10-CM

## 2012-04-25 MED ORDER — NAPROXEN SODIUM 550 MG PO TABS: 550.0000 mg | ORAL_TABLET | Freq: Two times a day (BID) | ORAL | Status: DC

## 2012-04-25 NOTE — Progress Notes (Signed)
[facility] [address] [place] [address] [phone]  Date:  04/25/2012   Name:  Carol Logan   DOB:  1976-06-21   MRN:  [id number]  PCP:  No primary provider on file.    Chief Complaint: Wrist Pain   History of Present Illness:  Carol Logan is a 36 y.o. very pleasant female patient who presents with the following:  No history of injury to wrist.  Has marked pain in thumb and radial wrist.  Thinks she has carpal tunnel as she spends a great deal of time on the computer. Awakens with pain and numbness in her hands at times.  Patient Active Problem List  Diagnosis  . Leiomyoma of uterus, unspecified  . Anemia due to blood loss, acute    Past Medical History  Diagnosis Date  . Fibroids   . Ovarian cyst   . No pertinent past medical history     Past Surgical History  Procedure Date  . Foot surgery   . Myomectomy 10/11/2011    Procedure: MYOMECTOMY;  Surgeon: Kathreen Cosier, MD;  Location: [facility];  Service: Gynecology;  Laterality: N/A;    History  Substance Use Topics  . Smoking status: Never Smoker   . Smokeless tobacco: Not on file  . Alcohol Use: Yes     socially    No family history on file.  No Known Allergies  Medication list has been reviewed and updated.  Current Outpatient Prescriptions on File Prior to Visit  Medication Sig Dispense Refill  . medroxyPROGESTERone (DEPO-PROVERA) 150 MG/ML injection Inject 150 mg into the muscle every 3 (three) months.      . DISCONTD: diphenhydramine-acetaminophen (TYLENOL PM) 25-500 MG TABS Take 1 tablet by mouth at bedtime as needed. For sleep        Review of Systems:  As per HPI, otherwise negative.    Physical Examination: Filed Vitals:   04/25/12 1920  BP: 134/89  Pulse: 93  Temp: 98.4 F (36.9 C)  Resp: 16   Filed Vitals:   04/25/12 1920  Height: 5' 5.5" (1.664 m)  Weight: 197 lb 3.2 oz (89.449 kg)   Body mass index is 32.32 kg/(m^2). Ideal Body  Weight: Weight in (lb) to have BMI = 25: 152.2    GEN: WDWN, NAD, Non-toxic, Alert & Oriented x 3 HEENT: Atraumatic, Normocephalic.  Ears and Nose: No external deformity. EXTR: No clubbing/cyanosis/edema NEURO: Normal gait.  PSYCH: Normally interactive. Conversant. Not depressed or anxious appearing.  Calm demeanor.  Hand:  Finklestein's test positive  Assessment and Plan: Dequervain's tenosynovitis Anaprox Follow up Wednesday Thumb spica splint  Carmelina Dane, MD  I have reviewed and agree with documentation. Robert P. Merla Riches, M.D.

## 2012-10-21 ENCOUNTER — Ambulatory Visit: Payer: 59

## 2012-11-13 ENCOUNTER — Encounter: Payer: Self-pay | Admitting: Diagnostic Neuroimaging

## 2012-11-13 ENCOUNTER — Ambulatory Visit (INDEPENDENT_AMBULATORY_CARE_PROVIDER_SITE_OTHER): Payer: 59 | Admitting: Diagnostic Neuroimaging

## 2012-11-13 VITALS — BP 125/86 | HR 77 | Temp 98.5°F | Ht 65.5 in | Wt 206.5 lb

## 2012-11-13 DIAGNOSIS — G43109 Migraine with aura, not intractable, without status migrainosus: Secondary | ICD-10-CM

## 2012-11-13 MED ORDER — SUMATRIPTAN SUCCINATE 100 MG PO TABS: 100.0000 mg | ORAL_TABLET | Freq: Once | ORAL | Status: DC | PRN

## 2012-11-13 MED ORDER — TOPIRAMATE 50 MG PO TABS: 50.0000 mg | ORAL_TABLET | Freq: Two times a day (BID) | ORAL | Status: DC

## 2012-11-13 NOTE — Patient Instructions (Signed)
Migraine Headache A migraine headache is an intense, throbbing pain on one or both sides of your head. A migraine can last for 30 minutes to several hours. CAUSES  The exact cause of a migraine headache is not always known. However, a migraine may be caused when nerves in the brain become irritated and release chemicals that cause inflammation. This causes pain. SYMPTOMS  Pain on one or both sides of your head.  Pulsating or throbbing pain.  Severe pain that prevents daily activities.  Pain that is aggravated by any physical activity.  Nausea, vomiting, or both.  Dizziness.  Pain with exposure to bright lights, loud noises, or activity.  General sensitivity to bright lights, loud noises, or smells. Before you get a migraine, you may get warning signs that a migraine is coming (aura). An aura may include:  Seeing flashing lights.  Seeing bright spots, halos, or zig-zag lines.  Having tunnel vision or blurred vision.  Having feelings of numbness or tingling.  Having trouble talking.  Having muscle weakness. MIGRAINE TRIGGERS  Alcohol.  Smoking.  Stress.  Menstruation.  Aged cheeses.  Foods or drinks that contain nitrates, glutamate, aspartame, or tyramine.  Lack of sleep.  Chocolate.  Caffeine.  Hunger.  Physical exertion.  Fatigue.  Medicines used to treat chest pain (nitroglycerine), birth control pills, estrogen, and some blood pressure medicines. DIAGNOSIS  A migraine headache is often diagnosed based on:  Symptoms.  Physical examination.  A CT scan or MRI of your head. TREATMENT Medicines may be given for pain and nausea. Medicines can also be given to help prevent recurrent migraines.  HOME CARE INSTRUCTIONS  Only take over-the-counter or prescription medicines for pain or discomfort as directed by your caregiver. The use of long-term narcotics is not recommended.  Lie down in a dark, quiet room when you have a migraine.  Keep a journal  to find out what may trigger your migraine headaches. For example, write down:  What you eat and drink.  How much sleep you get.  Any change to your diet or medicines.  Limit alcohol consumption.  Quit smoking if you smoke.  Get 7 to 9 hours of sleep, or as recommended by your caregiver.  Limit stress.  Keep lights dim if bright lights bother you and make your migraines worse. SEEK IMMEDIATE MEDICAL CARE IF:   Your migraine becomes severe.  You have a fever.  You have a stiff neck.  You have vision loss.  You have muscular weakness or loss of muscle control.  You start losing your balance or have trouble walking.  You feel faint or pass out.  You have severe symptoms that are different from your first symptoms. MAKE SURE YOU:   Understand these instructions.  Will watch your condition.  Will get help right away if you are not doing well or get worse. Document Released: 07/10/2005 Document Revised: 10/02/2011 Document Reviewed: 06/30/2011 [employer] Patient Information 2013 [employer].  

## 2012-11-13 NOTE — Progress Notes (Signed)
[facility]  PATIENT: Carol Logan DOB: 1975/08/07  REFERRING CLINICIAN: [facility] HISTORY FROM: patient  REASON FOR VISIT: new consult   HISTORICAL  CHIEF COMPLAINT:  Chief Complaint  Patient presents with  . Headache    HISTORY OF PRESENT ILLNESS:   37 year old right-handed female here for evaluation of headaches.  Past 2 months patient has had intermittent episodes of severe frontal pressure and throbbing headaches, left greater than right. Some nausea and vomiting. No photophobia or phonophobia. She's had some eyelid twitching. She has seen some black dots floating in front of her eyes. Symptoms improved when she lies down. She went to an optometrist who found no abnormalities. Patient having headaches every other day now. No family history of migraine. No similar headaches like this in the past.  REVIEW OF SYSTEMS: Full 14 system review of systems performed and notable only for 40 pound weight gain over the past year, trouble swallowing pills, blurred vision, snoring, headache.  ALLERGIES: No Known Allergies  HOME MEDICATIONS: Outpatient Prescriptions Prior to Visit  Medication Sig Dispense Refill  . acetaminophen (TYLENOL) 325 MG tablet Take 650 mg by mouth every 6 (six) hours as needed.      . medroxyPROGESTERone (DEPO-PROVERA) 150 MG/ML injection Inject 150 mg into the muscle every 3 (three) months.      . naproxen sodium (ANAPROX DS) 550 MG tablet Take 1 tablet (550 mg total) by mouth 2 (two) times daily with a meal.  40 tablet  0   No facility-administered medications prior to visit.    PAST MEDICAL HISTORY: Past Medical History  Diagnosis Date  . Fibroids   . Ovarian cyst   . No pertinent past medical history     PAST SURGICAL HISTORY: Past Surgical History  Procedure Laterality Date  . Foot surgery    . Myomectomy  10/11/2011    Procedure: MYOMECTOMY;  Surgeon: Kathreen Cosier, MD;  Location: [facility];  Service: Gynecology;   Laterality: N/A;    FAMILY HISTORY: Family History  Problem Relation Age of Onset  . High blood pressure Mother   . Glaucoma Paternal Uncle   . Alzheimer's disease Paternal Uncle     SOCIAL HISTORY:  History   Social History  . Marital Status: Married    Spouse Name: N/A    Number of Children: 1  . Years of Education: college   Occupational History  . 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 Smoking status: Never Smoker   . Smokeless tobacco: Not on file  . Alcohol Use: Yes     Comment: socially- 1 glass of wine monthly  . Drug Use: No  . Sexually Active: Not Currently   Other Topics Concern  . Not on file   Social History Narrative   Pt lives at home with her spouse and son.   Caffeine Use- Consumes coffee/tea twice a week     PHYSICAL EXAM  Filed Vitals:   11/13/12 1140  BP: 125/86  Pulse: 77  Temp: 98.5 F (36.9 C)  TempSrc: Oral  Height: 5' 5.5" (1.664 m)  Weight: 206 lb 8 oz (93.668 kg)   Body mass index is 33.83 kg/(m^2).  GENERAL EXAM: Patient is in no distress  CARDIOVASCULAR: Regular rate and rhythm, no murmurs, no carotid bruits  NEUROLOGIC: MENTAL STATUS: awake, alert, language fluent, comprehension intact, naming intact CRANIAL NERVE: no papilledema on fundoscopic exam, pupils equal and reactive to light, visual  fields full to confrontation, extraocular muscles intact, no nystagmus, facial sensation and strength symmetric, uvula midline, shoulder shrug symmetric, tongue midline. MOTOR: normal bulk and tone, full strength in the BUE, BLE SENSORY: normal and symmetric to light touch, temperature, vibration COORDINATION: finger-nose-finger, fine finger movements normal REFLEXES: deep tendon reflexes present and symmetric GAIT/STATION: narrow based gait; able to tandem; romberg is negative   DIAGNOSTIC DATA (LABS, IMAGING, TESTING) - I reviewed patient records, labs, notes, testing and imaging myself where  available.  Lab Results  Component Value Date   WBC 12.1* 10/12/2011   HGB 6.2* 10/14/2011   HCT 19.8* 10/14/2011   MCV 81.6 10/12/2011   PLT 156 10/12/2011      Component Value Date/Time   NA 137 10/11/2011 0720   K 4.4 10/11/2011 0720   CL 106 10/11/2011 0720   CO2 23 10/11/2011 0720   GLUCOSE 92 10/11/2011 0720   BUN 14 10/11/2011 0720   CREATININE 0.82 10/11/2011 0720   CALCIUM 9.8 10/11/2011 0720   PROT 7.2 10/11/2011 0720   ALBUMIN 3.4* 10/11/2011 0720   AST 15 10/11/2011 0720   ALT 8 10/11/2011 0720   ALKPHOS 64 10/11/2011 0720   BILITOT 0.1* 10/11/2011 0720   GFRNONAA >90 10/11/2011 0720   GFRAA >90 10/11/2011 0720   No results found for this basename: CHOL, HDL, LDLCALC, LDLDIRECT, TRIG, CHOLHDL   No results found for this basename: HGBA1C   No results found for this basename: VITAMINB12   No results found for this basename: TSH     ASSESSMENT AND PLAN  37 y.o. year old female  has a past medical history of Fibroids; Ovarian cyst; and No pertinent past medical history. here with severe intermittent headaches, new onset 2 months ago. Suspicious for new onset migraine headaches. I will check MRI of the brain as a precaution. I will treat her with topiramate plus sumatriptan. Encouraged patient to start headache diary.   Orders Placed This Encounter  Procedures  . MR Brain Wo Contrast    Suanne Marker, MD 11/13/2012, 12:12 PM Certified in Neurology, Neurophysiology and Neuroimaging  [facility] [address] [place], [place] [address] [phone]

## 2012-12-09 ENCOUNTER — Other Ambulatory Visit (INDEPENDENT_AMBULATORY_CARE_PROVIDER_SITE_OTHER): Payer: 59

## 2012-12-09 ENCOUNTER — Ambulatory Visit (INDEPENDENT_AMBULATORY_CARE_PROVIDER_SITE_OTHER): Payer: 59 | Admitting: Internal Medicine

## 2012-12-09 ENCOUNTER — Encounter: Payer: Self-pay | Admitting: Internal Medicine

## 2012-12-09 VITALS — BP 124/78 | HR 80 | Temp 98.5°F | Resp 16 | Ht 65.5 in | Wt 206.0 lb

## 2012-12-09 DIAGNOSIS — Z79899 Other long term (current) drug therapy: Secondary | ICD-10-CM

## 2012-12-09 DIAGNOSIS — Z0000 Encounter for general adult medical examination without abnormal findings: Secondary | ICD-10-CM

## 2012-12-09 DIAGNOSIS — Z23 Encounter for immunization: Secondary | ICD-10-CM

## 2012-12-09 LAB — CBC WITH DIFFERENTIAL/PLATELET
Basophils Absolute: 0.1 10*3/uL (ref 0.0–0.1)
Eosinophils Absolute: 0.1 10*3/uL (ref 0.0–0.7)
HCT: 38.8 % (ref 36.0–46.0)
Lymphs Abs: 2.4 10*3/uL (ref 0.7–4.0)
MCHC: 33 g/dL (ref 30.0–36.0)
Monocytes Relative: 6.3 % (ref 3.0–12.0)
Neutro Abs: 2.9 10*3/uL (ref 1.4–7.7)
Platelets: 199 10*3/uL (ref 150.0–400.0)
RDW: 14.2 % (ref 11.5–14.6)

## 2012-12-09 LAB — COMPREHENSIVE METABOLIC PANEL
AST: 14 U/L (ref 0–37)
BUN: 12 mg/dL (ref 6–23)
Calcium: 9.4 mg/dL (ref 8.4–10.5)
Chloride: 107 mEq/L (ref 96–112)
Creatinine, Ser: 0.8 mg/dL (ref 0.4–1.2)
Total Bilirubin: 0.3 mg/dL (ref 0.3–1.2)

## 2012-12-09 LAB — LIPID PANEL
Cholesterol: 242 mg/dL — ABNORMAL HIGH (ref 0–200)
HDL: 50.6 mg/dL (ref >39.00)
Triglycerides: 62 mg/dL (ref 0.0–149.0)
VLDL: 12.4 mg/dL (ref 0.0–40.0)

## 2012-12-09 LAB — TSH: TSH: 0.66 u[IU]/mL (ref 0.35–5.50)

## 2012-12-09 LAB — LDL CHOLESTEROL, DIRECT: Direct LDL: 173 mg/dL

## 2012-12-09 NOTE — Patient Instructions (Signed)
Preventive Care for Adults, Female A healthy lifestyle and preventive care can promote health and wellness. Preventive health guidelines for women include the following key practices.  A routine yearly physical is a good way to check with your caregiver about your health and preventive screening. It is a chance to share any concerns and updates on your health, and to receive a thorough exam.  Visit your dentist for a routine exam and preventive care every 6 months. Brush your teeth twice a day and floss once a day. Good oral hygiene prevents tooth decay and gum disease.  The frequency of eye exams is based on your age, health, family medical history, use of contact lenses, and other factors. Follow your caregiver's recommendations for frequency of eye exams.  Eat a healthy diet. Foods like vegetables, fruits, whole grains, low-fat dairy products, and lean protein foods contain the nutrients you need without too many calories. Decrease your intake of foods high in solid fats, added sugars, and salt. Eat the right amount of calories for you.Get information about a proper diet from your caregiver, if necessary.  Regular physical exercise is one of the most important things you can do for your health. Most adults should get at least 150 minutes of moderate-intensity exercise (any activity that increases your heart rate and causes you to sweat) each week. In addition, most adults need muscle-strengthening exercises on 2 or more days a week.  Maintain a healthy weight. The body mass index (BMI) is a screening tool to identify possible weight problems. It provides an estimate of body fat based on height and weight. Your caregiver can help determine your BMI, and can help you achieve or maintain a healthy weight.For adults 20 years and older:  A BMI below 18.5 is considered underweight.  A BMI of 18.5 to 24.9 is normal.  A BMI of 25 to 29.9 is considered overweight.  A BMI of 30 and above is  considered obese.  Maintain normal blood lipids and cholesterol levels by exercising and minimizing your intake of saturated fat. Eat a balanced diet with plenty of fruit and vegetables. Blood tests for lipids and cholesterol should begin at age 20 and be repeated every 5 years. If your lipid or cholesterol levels are high, you are over 50, or you are at high risk for heart disease, you may need your cholesterol levels checked more frequently.Ongoing high lipid and cholesterol levels should be treated with medicines if diet and exercise are not effective.  If you smoke, find out from your caregiver how to quit. If you do not use tobacco, do not start.  If you are pregnant, do not drink alcohol. If you are breastfeeding, be very cautious about drinking alcohol. If you are not pregnant and choose to drink alcohol, do not exceed 1 drink per day. One drink is considered to be 12 ounces (355 mL) of beer, 5 ounces (148 mL) of wine, or 1.5 ounces (44 mL) of liquor.  Avoid use of street drugs. Do not share needles with anyone. Ask for help if you need support or instructions about stopping the use of drugs.  High blood pressure causes heart disease and increases the risk of stroke. Your blood pressure should be checked at least every 1 to 2 years. Ongoing high blood pressure should be treated with medicines if weight loss and exercise are not effective.  If you are 55 to 37 years old, ask your caregiver if you should take aspirin to prevent strokes.  Diabetes   screening involves taking a blood sample to check your fasting blood sugar level. This should be done once every 3 years, after age 45, if you are within normal weight and without risk factors for diabetes. Testing should be considered at a younger age or be carried out more frequently if you are overweight and have at least 1 risk factor for diabetes.  Breast cancer screening is essential preventive care for women. You should practice "breast  self-awareness." This means understanding the normal appearance and feel of your breasts and may include breast self-examination. Any changes detected, no matter how small, should be reported to a caregiver. Women in their 20s and 30s should have a clinical breast exam (CBE) by a caregiver as part of a regular health exam every 1 to 3 years. After age 40, women should have a CBE every year. Starting at age 40, women should consider having a mammography (breast X-ray test) every year. Women who have a family history of breast cancer should talk to their caregiver about genetic screening. Women at a high risk of breast cancer should talk to their caregivers about having magnetic resonance imaging (MRI) and a mammography every year.  The Pap test is a screening test for cervical cancer. A Pap test can show cell changes on the cervix that might become cervical cancer if left untreated. A Pap test is a procedure in which cells are obtained and examined from the lower end of the uterus (cervix).  Women should have a Pap test starting at age 21.  Between ages 21 and 29, Pap tests should be repeated every 2 years.  Beginning at age 30, you should have a Pap test every 3 years as long as the past 3 Pap tests have been normal.  Some women have medical problems that increase the chance of getting cervical cancer. Talk to your caregiver about these problems. It is especially important to talk to your caregiver if a new problem develops soon after your last Pap test. In these cases, your caregiver may recommend more frequent screening and Pap tests.  The above recommendations are the same for women who have or have not gotten the vaccine for human papillomavirus (HPV).  If you had a hysterectomy for a problem that was not cancer or a condition that could lead to cancer, then you no longer need Pap tests. Even if you no longer need a Pap test, a regular exam is a good idea to make sure no other problems are  starting.  If you are between ages 65 and 70, and you have had normal Pap tests going back 10 years, you no longer need Pap tests. Even if you no longer need a Pap test, a regular exam is a good idea to make sure no other problems are starting.  If you have had past treatment for cervical cancer or a condition that could lead to cancer, you need Pap tests and screening for cancer for at least 20 years after your treatment.  If Pap tests have been discontinued, risk factors (such as a new sexual partner) need to be reassessed to determine if screening should be resumed.  The HPV test is an additional test that may be used for cervical cancer screening. The HPV test looks for the virus that can cause the cell changes on the cervix. The cells collected during the Pap test can be tested for HPV. The HPV test could be used to screen women aged 30 years and older, and should   be used in women of any age who have unclear Pap test results. After the age of 30, women should have HPV testing at the same frequency as a Pap test.  Colorectal cancer can be detected and often prevented. Most routine colorectal cancer screening begins at the age of 50 and continues through age 75. However, your caregiver may recommend screening at an earlier age if you have risk factors for colon cancer. On a yearly basis, your caregiver may provide home test kits to check for hidden blood in the stool. Use of a small camera at the end of a tube, to directly examine the colon (sigmoidoscopy or colonoscopy), can detect the earliest forms of colorectal cancer. Talk to your caregiver about this at age 50, when routine screening begins. Direct examination of the colon should be repeated every 5 to 10 years through age 75, unless early forms of pre-cancerous polyps or small growths are found.  Hepatitis C blood testing is recommended for all people born from 1945 through 1965 and any individual with known risks for hepatitis C.  Practice  safe sex. Use condoms and avoid high-risk sexual practices to reduce the spread of sexually transmitted infections (STIs). STIs include gonorrhea, chlamydia, syphilis, trichomonas, herpes, HPV, and human immunodeficiency virus (HIV). Herpes, HIV, and HPV are viral illnesses that have no cure. They can result in disability, cancer, and death. Sexually active women aged 25 and younger should be checked for chlamydia. Older women with new or multiple partners should also be tested for chlamydia. Testing for other STIs is recommended if you are sexually active and at increased risk.  Osteoporosis is a disease in which the bones lose minerals and strength with aging. This can result in serious bone fractures. The risk of osteoporosis can be identified using a bone density scan. Women ages 65 and over and women at risk for fractures or osteoporosis should discuss screening with their caregivers. Ask your caregiver whether you should take a calcium supplement or vitamin D to reduce the rate of osteoporosis.  Menopause can be associated with physical symptoms and risks. Hormone replacement therapy is available to decrease symptoms and risks. You should talk to your caregiver about whether hormone replacement therapy is right for you.  Use sunscreen with sun protection factor (SPF) of 30 or more. Apply sunscreen liberally and repeatedly throughout the day. You should seek shade when your shadow is shorter than you. Protect yourself by wearing long sleeves, pants, a wide-brimmed hat, and sunglasses year round, whenever you are outdoors.  Once a month, do a whole body skin exam, using a mirror to look at the skin on your back. Notify your caregiver of new moles, moles that have irregular borders, moles that are larger than a pencil eraser, or moles that have changed in shape or color.  Stay current with required immunizations.  Influenza. You need a dose every fall (or winter). The composition of the flu vaccine  changes each year, so being vaccinated once is not enough.  Pneumococcal polysaccharide. You need 1 to 2 doses if you smoke cigarettes or if you have certain chronic medical conditions. You need 1 dose at age 65 (or older) if you have never been vaccinated.  Tetanus, diphtheria, pertussis (Tdap, Td). Get 1 dose of Tdap vaccine if you are younger than age 65, are over 65 and have contact with an infant, are a healthcare worker, are pregnant, or simply want to be protected from whooping cough. After that, you need a Td   booster dose every 10 years. Consult your caregiver if you have not had at least 3 tetanus and diphtheria-containing shots sometime in your life or have a deep or dirty wound.  HPV. You need this vaccine if you are a woman age 26 or younger. The vaccine is given in 3 doses over 6 months.  Measles, mumps, rubella (MMR). You need at least 1 dose of MMR if you were born in 1957 or later. You may also need a second dose.  Meningococcal. If you are age 19 to 21 and a first-year college student living in a residence hall, or have one of several medical conditions, you need to get vaccinated against meningococcal disease. You may also need additional booster doses.  Zoster (shingles). If you are age 60 or older, you should get this vaccine.  Varicella (chickenpox). If you have never had chickenpox or you were vaccinated but received only 1 dose, talk to your caregiver to find out if you need this vaccine.  Hepatitis A. You need this vaccine if you have a specific risk factor for hepatitis A virus infection or you simply wish to be protected from this disease. The vaccine is usually given as 2 doses, 6 to 18 months apart.  Hepatitis B. You need this vaccine if you have a specific risk factor for hepatitis B virus infection or you simply wish to be protected from this disease. The vaccine is given in 3 doses, usually over 6 months. Preventive Services / Frequency Ages 19 to 39  Blood  pressure check.** / Every 1 to 2 years.  Lipid and cholesterol check.** / Every 5 years beginning at age 20.  Clinical breast exam.** / Every 3 years for women in their 20s and 30s.  Pap test.** / Every 2 years from ages 21 through 29. Every 3 years starting at age 30 through age 65 or 70 with a history of 3 consecutive normal Pap tests.  HPV screening.** / Every 3 years from ages 30 through ages 65 to 70 with a history of 3 consecutive normal Pap tests.  Hepatitis C blood test.** / For any individual with known risks for hepatitis C.  Skin self-exam. / Monthly.  Influenza immunization.** / Every year.  Pneumococcal polysaccharide immunization.** / 1 to 2 doses if you smoke cigarettes or if you have certain chronic medical conditions.  Tetanus, diphtheria, pertussis (Tdap, Td) immunization. / A one-time dose of Tdap vaccine. After that, you need a Td booster dose every 10 years.  HPV immunization. / 3 doses over 6 months, if you are 26 and younger.  Measles, mumps, rubella (MMR) immunization. / You need at least 1 dose of MMR if you were born in 1957 or later. You may also need a second dose.  Meningococcal immunization. / 1 dose if you are age 19 to 21 and a first-year college student living in a residence hall, or have one of several medical conditions, you need to get vaccinated against meningococcal disease. You may also need additional booster doses.  Varicella immunization.** / Consult your caregiver.  Hepatitis A immunization.** / Consult your caregiver. 2 doses, 6 to 18 months apart.  Hepatitis B immunization.** / Consult your caregiver. 3 doses usually over 6 months. Ages 40 to 64  Blood pressure check.** / Every 1 to 2 years.  Lipid and cholesterol check.** / Every 5 years beginning at age 20.  Clinical breast exam.** / Every year after age 40.  Mammogram.** / Every year beginning at age 40   and continuing for as long as you are in good health. Consult with your  caregiver.  Pap test.** / Every 3 years starting at age 30 through age 65 or 70 with a history of 3 consecutive normal Pap tests.  HPV screening.** / Every 3 years from ages 30 through ages 65 to 70 with a history of 3 consecutive normal Pap tests.  Fecal occult blood test (FOBT) of stool. / Every year beginning at age 50 and continuing until age 75. You may not need to do this test if you get a colonoscopy every 10 years.  Flexible sigmoidoscopy or colonoscopy.** / Every 5 years for a flexible sigmoidoscopy or every 10 years for a colonoscopy beginning at age 50 and continuing until age 75.  Hepatitis C blood test.** / For all people born from 1945 through 1965 and any individual with known risks for hepatitis C.  Skin self-exam. / Monthly.  Influenza immunization.** / Every year.  Pneumococcal polysaccharide immunization.** / 1 to 2 doses if you smoke cigarettes or if you have certain chronic medical conditions.  Tetanus, diphtheria, pertussis (Tdap, Td) immunization.** / A one-time dose of Tdap vaccine. After that, you need a Td booster dose every 10 years.  Measles, mumps, rubella (MMR) immunization. / You need at least 1 dose of MMR if you were born in 1957 or later. You may also need a second dose.  Varicella immunization.** / Consult your caregiver.  Meningococcal immunization.** / Consult your caregiver.  Hepatitis A immunization.** / Consult your caregiver. 2 doses, 6 to 18 months apart.  Hepatitis B immunization.** / Consult your caregiver. 3 doses, usually over 6 months. Ages 65 and over  Blood pressure check.** / Every 1 to 2 years.  Lipid and cholesterol check.** / Every 5 years beginning at age 20.  Clinical breast exam.** / Every year after age 40.  Mammogram.** / Every year beginning at age 40 and continuing for as long as you are in good health. Consult with your caregiver.  Pap test.** / Every 3 years starting at age 30 through age 65 or 70 with a 3  consecutive normal Pap tests. Testing can be stopped between 65 and 70 with 3 consecutive normal Pap tests and no abnormal Pap or HPV tests in the past 10 years.  HPV screening.** / Every 3 years from ages 30 through ages 65 or 70 with a history of 3 consecutive normal Pap tests. Testing can be stopped between 65 and 70 with 3 consecutive normal Pap tests and no abnormal Pap or HPV tests in the past 10 years.  Fecal occult blood test (FOBT) of stool. / Every year beginning at age 50 and continuing until age 75. You may not need to do this test if you get a colonoscopy every 10 years.  Flexible sigmoidoscopy or colonoscopy.** / Every 5 years for a flexible sigmoidoscopy or every 10 years for a colonoscopy beginning at age 50 and continuing until age 75.  Hepatitis C blood test.** / For all people born from 1945 through 1965 and any individual with known risks for hepatitis C.  Osteoporosis screening.** / A one-time screening for women ages 65 and over and women at risk for fractures or osteoporosis.  Skin self-exam. / Monthly.  Influenza immunization.** / Every year.  Pneumococcal polysaccharide immunization.** / 1 dose at age 65 (or older) if you have never been vaccinated.  Tetanus, diphtheria, pertussis (Tdap, Td) immunization. / A one-time dose of Tdap vaccine if you are over   65 and have contact with an infant, are a healthcare worker, or simply want to be protected from whooping cough. After that, you need a Td booster dose every 10 years.  Varicella immunization.** / Consult your caregiver.  Meningococcal immunization.** / Consult your caregiver.  Hepatitis A immunization.** / Consult your caregiver. 2 doses, 6 to 18 months apart.  Hepatitis B immunization.** / Check with your caregiver. 3 doses, usually over 6 months. ** Family history and personal history of risk and conditions may change your caregiver's recommendations. Document Released: 09/05/2001 Document Revised: 10/02/2011  Document Reviewed: 12/05/2010 [employer] Patient Information 2013 [employer].  

## 2012-12-09 NOTE — Progress Notes (Signed)
  Subjective:    Patient ID: Carol Logan, female    DOB: [birth year], 37 y.o.   MRN: [id number]  HPI  New to me for a physical - she feels well and offers no complaints.  Review of Systems  All other systems reviewed and are negative.       Objective:   Physical Exam  Vitals reviewed. Constitutional: She is oriented to person, place, and time. She appears well-developed and well-nourished. No distress.  HENT:  Head: Normocephalic and atraumatic.  Mouth/Throat: Oropharynx is clear and moist. No oropharyngeal exudate.  Eyes: Conjunctivae are normal. Right eye exhibits no discharge. Left eye exhibits no discharge. No scleral icterus.  Neck: Normal range of motion. Neck supple. No JVD present. No tracheal deviation present. No thyromegaly present.  Cardiovascular: Normal rate, regular rhythm, normal heart sounds and intact distal pulses.  Exam reveals no gallop and no friction rub.   No murmur heard. Pulmonary/Chest: Effort normal and breath sounds normal. No stridor. No respiratory distress. She has no wheezes. She has no rales. She exhibits no tenderness.  Abdominal: Soft. Bowel sounds are normal. She exhibits no distension and no mass. There is no tenderness. There is no rebound and no guarding.  Musculoskeletal: Normal range of motion. She exhibits no edema and no tenderness.  Lymphadenopathy:    She has no cervical adenopathy.  Neurological: She is oriented to person, place, and time.  Skin: Skin is warm and dry. No rash noted. She is not diaphoretic. No erythema. No pallor.  Psychiatric: She has a normal mood and affect. Her behavior is normal. Judgment and thought content normal.          Assessment & Plan:

## 2012-12-09 NOTE — Assessment & Plan Note (Signed)
Exam done Vaccines were updated Labs ordered Pt ed material was given 

## 2012-12-10 ENCOUNTER — Encounter: Payer: Self-pay | Admitting: Internal Medicine

## 2013-02-04 LAB — OB RESULTS CONSOLE HIV ANTIBODY (ROUTINE TESTING): HIV: NONREACTIVE

## 2013-02-04 LAB — OB RESULTS CONSOLE RPR: RPR: NONREACTIVE

## 2013-02-27 ENCOUNTER — Encounter (HOSPITAL_COMMUNITY): Payer: Self-pay | Admitting: *Deleted

## 2013-02-27 ENCOUNTER — Inpatient Hospital Stay (HOSPITAL_COMMUNITY)
Admission: AD | Admit: 2013-02-27 | Discharge: 2013-02-27 | Disposition: A | Discharge status: Home / Self Care | Payer: 59 | Source: Ambulatory Visit | Attending: Obstetrics | Admitting: Obstetrics

## 2013-02-27 DIAGNOSIS — O219 Vomiting of pregnancy, unspecified: Secondary | ICD-10-CM

## 2013-02-27 DIAGNOSIS — O210 Mild hyperemesis gravidarum: Secondary | ICD-10-CM | POA: Insufficient documentation

## 2013-02-27 LAB — URINALYSIS, ROUTINE W REFLEX MICROSCOPIC
Bilirubin Urine: NEGATIVE
Hgb urine dipstick: NEGATIVE
Ketones, ur: NEGATIVE mg/dL
Nitrite: NEGATIVE
Protein, ur: NEGATIVE mg/dL
Specific Gravity, Urine: >1.03 — ABNORMAL HIGH (ref 1.005–1.030)
Urobilinogen, UA: 2 mg/dL — ABNORMAL HIGH (ref 0.0–1.0)

## 2013-02-27 MED ORDER — GLYCOPYRROLATE 1 MG PO TABS: 1.0000 mg | ORAL_TABLET | Freq: Three times a day (TID) | ORAL | Status: DC

## 2013-02-27 MED ORDER — PROMETHAZINE HCL 25 MG/ML IJ SOLN
25.0000 mg | Freq: Once | INTRAVENOUS | Status: AC
  Administered 2013-02-27: 25 mg via INTRAVENOUS
  Filled 2013-02-27: qty 1

## 2013-02-27 MED ORDER — PROMETHAZINE HCL 25 MG PO TABS: 25.0000 mg | ORAL_TABLET | Freq: Four times a day (QID) | ORAL | Status: DC | PRN

## 2013-02-27 MED ORDER — PROMETHAZINE HCL 25 MG RE SUPP: 25.0000 mg | Freq: Four times a day (QID) | RECTAL | Status: DC | PRN

## 2013-02-27 MED ORDER — PROMETHAZINE HCL 25 MG PO TABS
25.0000 mg | ORAL_TABLET | Freq: Once | ORAL | Status: AC
  Administered 2013-02-27: 25 mg via ORAL
  Filled 2013-02-27: qty 1

## 2013-02-27 NOTE — MAU Provider Note (Signed)
History     CSN: [id number]  Arrival date and time: 02/27/13 1631   First Provider Initiated Contact with Patient 02/27/13 1922      No chief complaint on file.  HPI Ms. Carol Logan is a 37 y.o. G3P1011 at [redacted]w[redacted]d who presents to MAU today with N/V. The patient states this has been ongoing throughout the pregnancy. She was given Zofran, which is not helping. Last episode of emesis was just prior to arrival. Patient has occasional headache, although none now. She does have dizziness and feels lightheaded today. She denies diarrhea, constipation, UTI symptoms or fever.   OB History   Grav Para Term Preterm Abortions TAB SAB Ect Mult Living   3 1 1  0 1 0 1 0 0 1      Past Medical History  Diagnosis Date  . Fibroids   . Ovarian cyst   . No pertinent past medical history   . [id number](478.2)     Past Surgical History  Procedure Laterality Date  . Foot surgery    . Myomectomy  10/11/2011    Procedure: MYOMECTOMY;  Surgeon: Kathreen Cosier, MD;  Location: [facility];  Service: Gynecology;  Laterality: N/A;    Family History  Problem Relation Age of Onset  . High blood pressure Mother   . Glaucoma Paternal Uncle   . Alzheimer's disease Paternal Uncle   . Cancer Neg Hx   . Depression Neg Hx   . Diabetes Neg Hx   . Drug abuse Neg Hx   . Early death Neg Hx   . Alcohol abuse Neg Hx   . Heart disease Neg Hx   . Hyperlipidemia Neg Hx   . Hypertension Neg Hx   . Kidney disease Neg Hx   . Stroke Neg Hx     History  Substance Use Topics  . Smoking status: Never Smoker   . Smokeless tobacco: Never Used  . Alcohol Use: No     Comment: socially- 1 glass of wine monthly    Allergies: No Known Allergies  Prescriptions prior to admission  Medication Sig Dispense Refill  . acetaminophen (TYLENOL) 500 MG tablet Take 500 mg by mouth every 6 (six) hours as needed for pain.      Marland Kitchen ondansetron (ZOFRAN-ODT) 4 MG disintegrating tablet Take 4 mg by mouth every 8 (eight) hours as  needed for nausea.      . Prenatal Vit-Fe Fumarate-FA (PRENATAL MULTIVITAMIN) TABS tablet Take 1 tablet by mouth daily at 12 noon.        Review of Systems  Constitutional: Negative for fever and malaise/fatigue.  Gastrointestinal: Positive for nausea, vomiting and abdominal pain. Negative for diarrhea and constipation.  Genitourinary: Negative for dysuria, urgency and frequency.       Neg - vaginal bleeding, discharge  Neurological: Positive for dizziness and headaches. Negative for loss of consciousness.   Physical Exam   Blood pressure 117/75, pulse 82, temperature 98.5 F (36.9 C), temperature source Oral, resp. rate 18, last menstrual period 01/04/2013.  Physical Exam  Constitutional: She is oriented to person, place, and time. She appears well-developed and well-nourished. No distress.  HENT:  Head: Normocephalic and atraumatic.  Cardiovascular: Normal rate, regular rhythm and normal heart sounds.   Respiratory: Effort normal and breath sounds normal. No respiratory distress.  GI: Soft. Bowel sounds are normal. She exhibits no distension and no mass. There is no tenderness. There is no rebound and no guarding.  Neurological: She is alert and oriented to  person, place, and time.  Skin: Skin is warm and dry. No erythema.  Psychiatric: She has a normal mood and affect.   Results for orders placed during the hospital encounter of 02/27/13 (from the past 24 hour(s))  URINALYSIS, ROUTINE W REFLEX MICROSCOPIC     Status: Abnormal   Collection Time    02/27/13  4:40 PM      Result Value Range   Color, Urine YELLOW  YELLOW   APPearance CLEAR  CLEAR   Specific Gravity, Urine >1.030 (*) 1.005 - 1.030   pH 6.0  5.0 - 8.0   Glucose, UA NEGATIVE  NEGATIVE mg/dL   Hgb urine dipstick NEGATIVE  NEGATIVE   Bilirubin Urine NEGATIVE  NEGATIVE   Ketones, ur NEGATIVE  NEGATIVE mg/dL   Protein, ur NEGATIVE  NEGATIVE mg/dL   Urobilinogen, UA 2.0 (*) 0.0 - 1.0 mg/dL   Nitrite NEGATIVE   NEGATIVE   Leukocytes, UA NEGATIVE  NEGATIVE    MAU Course  Procedures None  MDM Patient had not had emesis x 3 hours while waiting for treatment room. Attempted PO Phenergan for N/V. Patient had immediate active emesis Started IV phenergan infusion with LR 2100 - patient getting IV fluids. Care turned over to [place], CNM   Freddi Starr, PA-C  02/27/2013, 7:23 PM  Assessment and Plan   Feeling better. Tolerating POs.  Assessment: 1. Nausea and vomiting in pregnancy prior to [redacted] weeks gestation    Plan: D/C home in stable condition. Advance diet slowly.   Medication List         acetaminophen 500 MG tablet  Commonly known as:  TYLENOL  Take 500 mg by mouth every 6 (six) hours as needed for pain.     glycopyrrolate 1 MG tablet  Commonly known as:  ROBINUL  Take 1-2 tablets (1-2 mg total) by mouth 3 (three) times daily.     ondansetron 4 MG disintegrating tablet  Commonly known as:  ZOFRAN-ODT  Take 4 mg by mouth every 8 (eight) hours as needed for nausea.     prenatal multivitamin Tabs tablet  Take 1 tablet by mouth daily at 12 noon.     promethazine 25 MG tablet  Commonly known as:  PHENERGAN  Take 1 tablet (25 mg total) by mouth every 6 (six) hours as needed for nausea.     promethazine 25 MG suppository  Commonly known as:  PHENERGAN  Place 1 suppository (25 mg total) rectally every 6 (six) hours as needed for nausea.       Follow-up Information   Follow up with MARSHALL,BERNARD A, MD. (as scheduled)    Contact information:   [address] [place] [address] [phone]       Follow up with [facility] [facility] ADMISS[facility]. (As needed if symptoms worsen)    Contact information:   [address] [id number] [place] [address] [phone]     Dorathy Kinsman, [place] 02/27/2013 9:37 PM

## 2013-02-27 NOTE — MAU Note (Signed)
preg confirmed at Dr Elsie Stain, seen there a couple times.  Excessive vomiting last couple days,  Vomiting bile and mucous. Saw some blood in it.  Been through this before.  Also having some cramping at night.  Was given zofran- not really helping.

## 2013-03-05 ENCOUNTER — Other Ambulatory Visit: Payer: Self-pay | Admitting: Obstetrics

## 2013-03-31 NOTE — H&P (Signed)
NAMEANNORA, GUDERIAN           ACCOUNT NO.:  [id number]  MEDICAL RECORD NO.:  [id number]  LOCATION:  [facility]                         FACILITY:  [facility]  PHYSICIAN:  Kathreen Cosier, M.D.DATE OF BIRTH:  February 23, 1976  DATE OF ADMISSION:  03/04/2013 DATE OF DISCHARGE:                             HISTORY & PHYSICAL   HISTORY OF PRESENT ILLNESS:  The patient is a 37 year old, gravida 4, para 1-0-2-1, who in the past had a myomectomy and a history of an incompetent cervix, and she is in for a cervical cerclage.  She had a 20- week fetal loss with premature rupture of membranes, and then, she had a multiple myomectomy post that procedure.  PAST MEDICAL HISTORY:  Negative.  SURGICAL HISTORY:  As stated above.  SYSTEM REVIEW:  Noncontributory.  PHYSICAL EXAM:  GENERAL:  Well-developed female, in no distress. HEENT:  Negative. LUNGS:  Clear to P and A. HEART:  Regular rhythm.  No murmurs.  No gallops. BREASTS:  Negative. ABDOMEN:  Negative. PELVIS:  Uterus was 12-week size, with a positive fetal heart rate. EXTREMITIES:  Negative.          ______________________________ Kathreen Cosier, M.D.     BAM/MEDQ  D:  03/31/2013  T:  03/31/2013  Job:  [id number]

## 2013-04-01 ENCOUNTER — Encounter (HOSPITAL_COMMUNITY): Payer: Self-pay | Admitting: Anesthesiology

## 2013-04-01 ENCOUNTER — Ambulatory Visit (HOSPITAL_COMMUNITY): Payer: 59 | Admitting: Anesthesiology

## 2013-04-01 ENCOUNTER — Encounter (HOSPITAL_COMMUNITY)
Admission: RE | Disposition: A | Discharge status: Home / Self Care | Payer: Self-pay | Source: Ambulatory Visit | Attending: Obstetrics

## 2013-04-01 ENCOUNTER — Ambulatory Visit (HOSPITAL_COMMUNITY)
Admission: RE | Admit: 2013-04-01 | Discharge: 2013-04-01 | Disposition: A | Discharge status: Home / Self Care | Payer: 59 | Source: Ambulatory Visit | Attending: Obstetrics | Admitting: Obstetrics

## 2013-04-01 ENCOUNTER — Encounter (HOSPITAL_COMMUNITY): Payer: Self-pay | Admitting: Registered Nurse

## 2013-04-01 DIAGNOSIS — O3430 Maternal care for cervical incompetence, unspecified trimester: Secondary | ICD-10-CM | POA: Insufficient documentation

## 2013-04-01 HISTORY — PX: CERVICAL CERCLAGE: SHX1329

## 2013-04-01 LAB — CBC
Platelets: 194 10*3/uL (ref 150–400)
RDW: 13.8 % (ref 11.5–15.5)
WBC: 9.7 10*3/uL (ref 4.0–10.5)

## 2013-04-01 SURGERY — CERCLAGE, CERVIX, VAGINAL APPROACH
Anesthesia: Spinal

## 2013-04-01 MED ORDER — LACTATED RINGERS IV SOLN
INTRAVENOUS | Status: DC
  Administered 2013-04-01 (×2): via INTRAVENOUS

## 2013-04-01 MED ORDER — LIDOCAINE IN DEXTROSE 5-7.5 % IV SOLN
INTRAVENOUS | Status: AC
  Filled 2013-04-01: qty 2

## 2013-04-01 MED ORDER — ONDANSETRON HCL 4 MG/2ML IJ SOLN
INTRAMUSCULAR | Status: AC
  Filled 2013-04-01: qty 2

## 2013-04-01 MED ORDER — ONDANSETRON HCL 4 MG/2ML IJ SOLN
INTRAMUSCULAR | Status: DC | PRN
  Administered 2013-04-01: 4 mg via INTRAVENOUS

## 2013-04-01 SURGICAL SUPPLY — 17 items
CATH ROBINSON RED A/P 16FR (CATHETERS) IMPLANT
CLOTH BEACON ORANGE TIMEOUT ST (SAFETY) ×2 IMPLANT
COUNTER NEEDLE 1200 MAGNETIC (NEEDLE) IMPLANT
GLOVE BIO SURGEON STRL SZ8.5 (GLOVE) ×2 IMPLANT
GOWN PREVENTION PLUS XXLARGE (GOWN DISPOSABLE) ×2 IMPLANT
GOWN STRL REIN XL XLG (GOWN DISPOSABLE) ×4 IMPLANT
NDL MA TROC 1/2 (NEEDLE) IMPLANT
NEEDLE MA TROC 1/2 (NEEDLE) IMPLANT
NEEDLE MAYO .5 CIRCLE (NEEDLE) ×2 IMPLANT
PACK VAGINAL MINOR WOMEN LF (CUSTOM PROCEDURE TRAY) ×2 IMPLANT
PAD OB MATERNITY 4.3X12.25 (PERSONAL CARE ITEMS) ×2 IMPLANT
PAD PREP 24X48 CUFFED NSTRL (MISCELLANEOUS) ×2 IMPLANT
SUT MERSILENE 5MM BP 1 12 (SUTURE) ×2 IMPLANT
TOWEL OR 17X24 6PK STRL BLUE (TOWEL DISPOSABLE) ×4 IMPLANT
TUBING NON-CON 1/4 X 20 CONN (TUBING) IMPLANT
WATER STERILE IRR 1000ML POUR (IV SOLUTION) ×2 IMPLANT
YANKAUER SUCT BULB TIP NO VENT (SUCTIONS) IMPLANT

## 2013-04-01 NOTE — Transfer of Care (Signed)
Immediate Anesthesia Transfer of Care Note  Patient: Carol Logan  Procedure(s) Performed: Procedure(s): CERCLAGE CERVICAL (N/A)  Patient Location: PACU  Anesthesia Type:Spinal  Level of Consciousness: awake, alert  and oriented  Airway & Oxygen Therapy: Patient Spontanous Breathing  Post-op Assessment: Report given to PACU RN  Post vital signs: Reviewed  Complications: No apparent anesthesia complications

## 2013-04-01 NOTE — Anesthesia Procedure Notes (Signed)
Spinal  Patient location during procedure: OR Preanesthetic Checklist Completed: patient identified, site marked, surgical consent, pre-op evaluation, timeout performed, IV checked, risks and benefits discussed and monitors and equipment checked Spinal Block Patient position: sitting Prep: DuraPrep Patient monitoring: heart rate, cardiac monitor, continuous pulse ox and blood pressure Approach: midline Location: L3-4 Injection technique: single-shot Needle Needle type: Sprotte  Needle gauge: 24 G Needle length: 9 cm Assessment Sensory level: T4 Additional Notes Spinal Dosage in OR  Xylocaine  1 ml 5%

## 2013-04-01 NOTE — Op Note (Signed)
preop diagnosis incompetent cervix Postop diagnosis placement of cervical cerclage Anesthesia spinal Surgeon Dr. Francoise Ceo Procedure after the spinal placed the patient's lithotomy position perineum and vagina prepped and draped data entered with a straight catheter day a weighted speculum placed in the vagina cervix grasped at 12:00 and and #5 Mersilene band placed around the cervix and tied at 6:00 in the usual manner patient tolerated the procedure well taken to recovery room in good condition

## 2013-04-01 NOTE — Preoperative (Signed)
Beta Blockers   Reason not to administer Beta Blockers:Not Applicable 

## 2013-04-01 NOTE — H&P (Signed)
  There has been no change in the history and physical since the original dictation 

## 2013-04-01 NOTE — Anesthesia Preprocedure Evaluation (Signed)

## 2013-04-02 ENCOUNTER — Encounter (HOSPITAL_COMMUNITY): Payer: Self-pay | Admitting: Obstetrics

## 2013-04-02 NOTE — Anesthesia Postprocedure Evaluation (Signed)
  Anesthesia Post-op Note  Patient: Carol Logan  Procedure(s) Performed: Procedure(s): CERCLAGE CERVICAL (N/A)  Patient is awake, responsive, moving her legs, and has signs of resolution of her numbness. Pain and nausea are reasonably well controlled. Vital signs are stable and clinically acceptable. Oxygen saturation is clinically acceptable. There are no apparent anesthetic complications at this time. Patient is ready for discharge.

## 2013-04-30 ENCOUNTER — Other Ambulatory Visit: Payer: Self-pay

## 2013-04-30 ENCOUNTER — Ambulatory Visit (HOSPITAL_COMMUNITY)
Admission: RE | Admit: 2013-04-30 | Discharge: 2013-04-30 | Disposition: A | Discharge status: Home / Self Care | Payer: 59 | Source: Ambulatory Visit | Attending: Obstetrics | Admitting: Obstetrics

## 2013-04-30 DIAGNOSIS — O3430 Maternal care for cervical incompetence, unspecified trimester: Secondary | ICD-10-CM | POA: Insufficient documentation

## 2013-04-30 DIAGNOSIS — IMO0002 Reserved for concepts with insufficient information to code with codable children: Secondary | ICD-10-CM | POA: Insufficient documentation

## 2013-04-30 DIAGNOSIS — O09529 Supervision of elderly multigravida, unspecified trimester: Secondary | ICD-10-CM | POA: Insufficient documentation

## 2013-04-30 NOTE — Progress Notes (Signed)
Genetic Counseling  High-Risk Gestation Note  Appointment Date:  04/30/2013 Referred By: Kathreen Cosier, MD Date of Birth:  1975/10/03 Partner:  Armen Pickup    Pregnancy History: [id number] Estimated Date of Delivery: 10/11/13 Estimated Gestational Age: [redacted]w[redacted]d Attending: Alpha Gula, MD   Carol Logan was seen for genetic counseling because of a maternal age of 37.   She was counseled regarding maternal age and the association with risk for chromosome conditions due to nondisjunction with aging of the ova.   We reviewed chromosomes, nondisjunction, and the associated 1 in 49 risk for fetal aneuploidy related to a maternal age of 37 y.o. at [redacted]w[redacted]d gestation.  She was counseled that the risk for aneuploidy decreases as gestational age increases, accounting for those pregnancies which spontaneously abort.  We specifically discussed Down syndrome (trisomy 63), trisomies 70 and 3, and sex chromosome aneuploidies (47,XXX and 47,XXY) including the common features and prognoses of each.   We reviewed available screening options including Quad screen, noninvasive prenatal screening (NIPS)/cell free fetal DNA (cffDNA) testing, and detailed ultrasound.  She was counseled that screening tests are used to modify a patient's a priori risk for aneuploidy, typically based on age. This estimate provides a pregnancy specific risk assessment. We reviewed the benefits and limitations of each option. Specifically, we discussed the conditions for which each test screens, the detection rates, and false positive rates of each. She was also counseled regarding diagnostic testing via amniocentesis. We reviewed the approximate 1 in 300-500 risk for complications for amniocentesis, including spontaneous pregnancy loss. After consideration of all the options, she elected to proceed with NIPS.  Those results will be available in 8-10 days.    She  also expressed interest in returning for a detailed  ultrasound at ~18+ weeks gestation.  This appointment was scheduled today.  She understands that screening tests cannot rule out all birth defects or genetic syndromes. The patient was advised of this limitation and states she still does not want additional testing at this time.   Carol Logan  was provided with written information regarding sickle cell anemia (SCA) including the carrier frequency and incidence in the African-American population, the availability of carrier testing and prenatal diagnosis if indicated.  In addition, we discussed that hemoglobinopathies are routinely screened for as part of the [place] newborn screening panel.  She declined hemoglobin electrophoresis today.  Both family histories were reviewed and found to be noncontributory for birth defects, intellectual disability, and known genetic conditions. Without further information regarding the provided family history, an accurate genetic risk cannot be calculated. Further genetic counseling is warranted if more information is obtained.  Carol Logan denied exposure to environmental toxins or chemical agents. She denied the use of alcohol, tobacco or street drugs. She denied significant viral illnesses during the course of her pregnancy. Her medical and surgical histories were contributory for multiple myomectomies, two TABs, and a SAB.  Carol Logan had a cerclage placed previously during this pregnancy for a h/o incompetent cervix.   I counseled Carol Logan regarding the above risks and available options.  The approximate face-to-face time with the genetic counselor was 42 minutes.  Donald Prose, MS Certified Genetic Counselor

## 2013-05-07 ENCOUNTER — Telehealth (HOSPITAL_COMMUNITY): Payer: Self-pay

## 2013-05-07 NOTE — Telephone Encounter (Signed)
Called Tracey Harries to discuss her cell free fetal DNA test results.  Ms. ETHERINE MACKOWIAK had Panorama testing through [facility].  Testing was offered because of a maternal age of 78.   The patient was identified by name and DOB.  We reviewed that these are within normal limits, showing a less than 1 in 10,000 risk for trisomies 21, 18 and 13, and monosomy X (Turner syndrome).  In addition, the risk for triploidy/vanishing twin and sex chromosome trisomies (47,XXX and 47,XXY) was also low risk.  We reviewed that this testing identifies > 99% of pregnancies with trisomy 30, trisomy 82, trisomy 63, sex chromosome trisomies (47,XXX and 47,XXY), and triploidy.  The detection rate for monosomy X is ~92%.  The false positive rate is <0.1% for all conditions. Testing was also consistent with female gender.  She understands that this testing does not identify all genetic conditions.  All questions were answered to her satisfaction, she was encouraged to call with additional questions or concerns.  Despina Arias, MS Certified Genetic Counselor

## 2013-05-13 ENCOUNTER — Other Ambulatory Visit (HOSPITAL_COMMUNITY): Payer: Self-pay | Admitting: Obstetrics

## 2013-05-13 DIAGNOSIS — O09529 Supervision of elderly multigravida, unspecified trimester: Secondary | ICD-10-CM

## 2013-05-13 DIAGNOSIS — Z0489 Encounter for examination and observation for other specified reasons: Secondary | ICD-10-CM

## 2013-05-14 ENCOUNTER — Encounter (HOSPITAL_COMMUNITY): Payer: Self-pay

## 2013-05-14 ENCOUNTER — Ambulatory Visit (HOSPITAL_COMMUNITY)
Admission: RE | Admit: 2013-05-14 | Discharge: 2013-05-14 | Disposition: A | Discharge status: Home / Self Care | Payer: 59 | Source: Ambulatory Visit | Attending: Obstetrics | Admitting: Obstetrics

## 2013-05-14 DIAGNOSIS — O09529 Supervision of elderly multigravida, unspecified trimester: Secondary | ICD-10-CM | POA: Insufficient documentation

## 2013-05-14 DIAGNOSIS — Z1389 Encounter for screening for other disorder: Secondary | ICD-10-CM | POA: Insufficient documentation

## 2013-05-14 DIAGNOSIS — Z363 Encounter for antenatal screening for malformations: Secondary | ICD-10-CM | POA: Insufficient documentation

## 2013-05-14 DIAGNOSIS — O358XX0 Maternal care for other (suspected) fetal abnormality and damage, not applicable or unspecified: Secondary | ICD-10-CM | POA: Insufficient documentation

## 2013-05-14 DIAGNOSIS — O3490 Maternal care for abnormality of pelvic organ, unspecified, unspecified trimester: Secondary | ICD-10-CM | POA: Insufficient documentation

## 2013-05-14 DIAGNOSIS — Z0489 Encounter for examination and observation for other specified reasons: Secondary | ICD-10-CM

## 2013-05-27 ENCOUNTER — Other Ambulatory Visit (HOSPITAL_COMMUNITY): Payer: Self-pay | Admitting: Obstetrics

## 2013-05-27 DIAGNOSIS — O09529 Supervision of elderly multigravida, unspecified trimester: Secondary | ICD-10-CM

## 2013-05-28 ENCOUNTER — Encounter (HOSPITAL_COMMUNITY): Payer: Self-pay

## 2013-05-28 ENCOUNTER — Ambulatory Visit (HOSPITAL_COMMUNITY)
Admission: RE | Admit: 2013-05-28 | Discharge: 2013-05-28 | Disposition: A | Discharge status: Home / Self Care | Payer: 59 | Source: Ambulatory Visit | Attending: Obstetrics | Admitting: Obstetrics

## 2013-05-28 DIAGNOSIS — O3490 Maternal care for abnormality of pelvic organ, unspecified, unspecified trimester: Secondary | ICD-10-CM | POA: Insufficient documentation

## 2013-05-28 DIAGNOSIS — O09529 Supervision of elderly multigravida, unspecified trimester: Secondary | ICD-10-CM | POA: Insufficient documentation

## 2013-05-29 ENCOUNTER — Other Ambulatory Visit: Payer: Self-pay

## 2013-06-11 ENCOUNTER — Ambulatory Visit (HOSPITAL_COMMUNITY)
Admission: RE | Admit: 2013-06-11 | Discharge: 2013-06-11 | Disposition: A | Discharge status: Home / Self Care | Payer: 59 | Source: Ambulatory Visit | Attending: Obstetrics | Admitting: Obstetrics

## 2013-06-11 ENCOUNTER — Other Ambulatory Visit (HOSPITAL_COMMUNITY): Payer: Self-pay | Admitting: Obstetrics

## 2013-06-11 DIAGNOSIS — O3490 Maternal care for abnormality of pelvic organ, unspecified, unspecified trimester: Secondary | ICD-10-CM | POA: Insufficient documentation

## 2013-06-11 DIAGNOSIS — O09529 Supervision of elderly multigravida, unspecified trimester: Secondary | ICD-10-CM | POA: Insufficient documentation

## 2013-06-25 ENCOUNTER — Ambulatory Visit (HOSPITAL_COMMUNITY)
Admission: RE | Admit: 2013-06-25 | Discharge: 2013-06-25 | Disposition: A | Discharge status: Home / Self Care | Payer: 59 | Source: Ambulatory Visit | Attending: Obstetrics | Admitting: Obstetrics

## 2013-06-25 DIAGNOSIS — O09529 Supervision of elderly multigravida, unspecified trimester: Secondary | ICD-10-CM | POA: Insufficient documentation

## 2013-06-25 DIAGNOSIS — O3490 Maternal care for abnormality of pelvic organ, unspecified, unspecified trimester: Secondary | ICD-10-CM | POA: Insufficient documentation

## 2013-06-25 NOTE — ED Notes (Signed)
Patient states she has noticed worsening vaginal pressure. She is to pick up Procardia and prometrium today to begin taking.

## 2013-07-07 ENCOUNTER — Other Ambulatory Visit (HOSPITAL_COMMUNITY): Payer: Self-pay | Admitting: Obstetrics

## 2013-07-07 DIAGNOSIS — O09529 Supervision of elderly multigravida, unspecified trimester: Secondary | ICD-10-CM

## 2013-07-09 ENCOUNTER — Ambulatory Visit (HOSPITAL_COMMUNITY)
Admission: RE | Admit: 2013-07-09 | Discharge: 2013-07-09 | Disposition: A | Discharge status: Home / Self Care | Payer: 59 | Source: Ambulatory Visit | Attending: Internal Medicine | Admitting: Internal Medicine

## 2013-07-09 DIAGNOSIS — O09529 Supervision of elderly multigravida, unspecified trimester: Secondary | ICD-10-CM | POA: Insufficient documentation

## 2013-07-09 DIAGNOSIS — O3490 Maternal care for abnormality of pelvic organ, unspecified, unspecified trimester: Secondary | ICD-10-CM | POA: Insufficient documentation

## 2013-08-04 ENCOUNTER — Encounter (HOSPITAL_COMMUNITY): Payer: Self-pay | Admitting: *Deleted

## 2013-08-04 ENCOUNTER — Inpatient Hospital Stay (HOSPITAL_COMMUNITY)
Admission: AD | Admit: 2013-08-04 | Discharge: 2013-08-05 | Disposition: A | Discharge status: Home / Self Care | Payer: 59 | Source: Ambulatory Visit | Attending: Obstetrics | Admitting: Obstetrics

## 2013-08-04 DIAGNOSIS — W108XXA Fall (on) (from) other stairs and steps, initial encounter: Secondary | ICD-10-CM | POA: Insufficient documentation

## 2013-08-04 DIAGNOSIS — Y929 Unspecified place or not applicable: Secondary | ICD-10-CM | POA: Insufficient documentation

## 2013-08-04 DIAGNOSIS — N760 Acute vaginitis: Secondary | ICD-10-CM | POA: Insufficient documentation

## 2013-08-04 DIAGNOSIS — O2390 Unspecified genitourinary tract infection in pregnancy, unspecified trimester: Secondary | ICD-10-CM | POA: Insufficient documentation

## 2013-08-04 DIAGNOSIS — O4700 False labor before 37 completed weeks of gestation, unspecified trimester: Secondary | ICD-10-CM | POA: Insufficient documentation

## 2013-08-04 DIAGNOSIS — B9689 Other specified bacterial agents as the cause of diseases classified elsewhere: Secondary | ICD-10-CM

## 2013-08-04 LAB — URINALYSIS, ROUTINE W REFLEX MICROSCOPIC
BILIRUBIN URINE: NEGATIVE
Glucose, UA: 250 mg/dL — AB
Ketones, ur: 15 mg/dL — AB
Nitrite: NEGATIVE
PH: 6 (ref 5.0–8.0)
Protein, ur: NEGATIVE mg/dL
Specific Gravity, Urine: 1.01 (ref 1.005–1.030)
Urobilinogen, UA: 0.2 mg/dL (ref 0.0–1.0)

## 2013-08-04 LAB — URINE MICROSCOPIC-ADD ON

## 2013-08-04 NOTE — MAU Provider Note (Signed)
History     CSN: [id number]  Arrival date and time: 08/04/13 2241   First Provider Initiated Contact with Patient 08/04/13 2325      Chief Complaint  Patient presents with  . Contractions  . Rupture of Membranes  . Fall   HPI  Carol Logan is a 38 y.o. G5P1031 at [redacted]w[redacted]d who presents today with watery discahrge x 1-2 weeks. She also states that she feel at 0500 today. She states that she slipped on steps, and fell hard on her bottom. She has not had intercourse in the last 24 hours. She denies any bleeding, and states that the baby has been moving normally. She has not had her cervix checked in a "couple of weeks". She states that she had been going to [facility], and they had been checking her.   Past Medical History  Diagnosis Date  . Fibroids   . Ovarian cyst   . No pertinent past medical history   . JKKXFGHW(299.3)     Past Surgical History  Procedure Laterality Date  . Foot surgery    . Myomectomy  10/11/2011    Procedure: MYOMECTOMY;  Surgeon: [name], MD;  Location: [facility];  Service: Gynecology;  Laterality: N/A;  . Cervical cerclage N/A 04/01/2013    Procedure: CERCLAGE CERVICAL;  Surgeon: [name], MD;  Location: [facility];  Service: Gynecology;  Laterality: N/A;    Family History  Problem Relation Age of Onset  . High blood pressure Mother   . Glaucoma Paternal Uncle   . Alzheimer's disease Paternal Uncle   . Cancer Neg Hx   . Depression Neg Hx   . Diabetes Neg Hx   . Drug abuse Neg Hx   . Early death Neg Hx   . Alcohol abuse Neg Hx   . Heart disease Neg Hx   . Hyperlipidemia Neg Hx   . Hypertension Neg Hx   . Kidney disease Neg Hx   . Stroke Neg Hx     History  Substance Use Topics  . Smoking status: Never Smoker   . Smokeless tobacco: Never Used  . Alcohol Use: No     Comment: socially- 1 glass of wine monthly    Allergies: No Known Allergies  Prescriptions prior to admission  Medication Sig Dispense Refill  . NIFEdipine  (PROCARDIA PO) Take by mouth.      . ondansetron (ZOFRAN-ODT) 4 MG disintegrating tablet Take 4 mg by mouth every 8 (eight) hours as needed for nausea.      . Prenatal Vit-Fe Fumarate-FA (PRENATAL MULTIVITAMIN) TABS tablet Take 1 tablet by mouth daily at 12 noon.      . progesterone (PROMETRIUM) 200 MG capsule Place 200 mg vaginally daily.      . promethazine (PHENERGAN) 25 MG tablet Take 1 tablet (25 mg total) by mouth every 6 (six) hours as needed for nausea.  30 tablet  1    ROS Physical Exam   Blood pressure 125/76, pulse 120, temperature 98.9 F (37.2 C), temperature source Oral, resp. rate 20, height 5\' 5"  (1.651 m), weight 85.73 kg (189 lb), last menstrual period 01/04/2013, SpO2 99.00%.  Physical Exam  Nursing note and vitals reviewed. Constitutional: She is oriented to person, place, and time. She appears well-developed and well-nourished. No distress.  Cardiovascular: Normal rate.   Respiratory: Effort normal.  GI: Soft. There is no tenderness.  Genitourinary:   External: no lesion Vagina: small amount of white discharge Cervix: pink, smooth, closed, cerclage intact  Uterus:AGA   Musculoskeletal: Normal range of motion. She exhibits no edema and no tenderness.  No bruising to shoulder   Neurological: She is alert and oriented to person, place, and time.  Skin: Skin is warm and dry.  Psychiatric: She has a normal mood and affect.    MAU Course  Procedures  Results for orders placed during the hospital encounter of 08/04/13 (from the past 24 hour(s))  URINALYSIS, ROUTINE W REFLEX MICROSCOPIC     Status: Abnormal   Collection Time    08/04/13 11:02 PM      Result Value Range   Color, Urine YELLOW  YELLOW   APPearance HAZY (*) CLEAR   Specific Gravity, Urine 1.010  1.005 - 1.030   pH 6.0  5.0 - 8.0   Glucose, UA 250 (*) NEGATIVE mg/dL   Hgb urine dipstick TRACE (*) NEGATIVE   Bilirubin Urine NEGATIVE  NEGATIVE   Ketones, ur 15 (*) NEGATIVE mg/dL   Protein, ur  NEGATIVE  NEGATIVE mg/dL   Urobilinogen, UA 0.2  0.0 - 1.0 mg/dL   Nitrite NEGATIVE  NEGATIVE   Leukocytes, UA MODERATE (*) NEGATIVE  URINE MICROSCOPIC-ADD ON     Status: Abnormal   Collection Time    08/04/13 11:02 PM      Result Value Range   Squamous Epithelial / LPF MANY (*) RARE   WBC, UA 7-10  <3 WBC/hpf   Bacteria, UA FEW (*) RARE  WET PREP, GENITAL     Status: Abnormal   Collection Time    08/04/13 11:35 PM      Result Value Range   Yeast Wet Prep HPF POC NONE SEEN  NONE SEEN   Trich, Wet Prep NONE SEEN  NONE SEEN   Clue Cells Wet Prep HPF POC MODERATE (*) NONE SEEN   WBC, Wet Prep HPF POC MODERATE (*) NONE SEEN     2353: D/W Dr. Ruthann Cancer, ok for patient to be dc home.   Assessment and Plan   1. Bacterial vaginal infection    Preterm labor precautions Fetal kick counts Return to MAU as needed    Medication List         metroNIDAZOLE 500 MG tablet  Commonly known as:  FLAGYL  Take 1 tablet (500 mg total) by mouth 2 (two) times daily.     ondansetron 4 MG disintegrating tablet  Commonly known as:  ZOFRAN-ODT  Take 4 mg by mouth every 8 (eight) hours as needed for nausea.     prenatal multivitamin Tabs tablet  Take 1 tablet by mouth daily at 12 noon.     PROCARDIA PO  Take by mouth.     progesterone 200 MG capsule  Commonly known as:  PROMETRIUM  Place 200 mg vaginally daily.     promethazine 25 MG tablet  Commonly known as:  PHENERGAN  Take 1 tablet (25 mg total) by mouth every 6 (six) hours as needed for nausea.       Follow-up Information   Follow up with [name], MD. (as scheduled. )    Specialty:  Obstetrics and Gynecology   Contact information:   [place] [address] [phone]       Mathis Bud 08/04/2013, 11:29 PM

## 2013-08-04 NOTE — MAU Note (Signed)
Pt reports watery discharge for the last few days, states she fell down one step last pm (sit down hard on her bottom). Pt has cerclage.

## 2013-08-05 DIAGNOSIS — B9689 Other specified bacterial agents as the cause of diseases classified elsewhere: Secondary | ICD-10-CM

## 2013-08-05 DIAGNOSIS — N760 Acute vaginitis: Secondary | ICD-10-CM

## 2013-08-05 DIAGNOSIS — A499 Bacterial infection, unspecified: Secondary | ICD-10-CM

## 2013-08-05 DIAGNOSIS — O479 False labor, unspecified: Secondary | ICD-10-CM

## 2013-08-05 LAB — WET PREP, GENITAL
Trich, Wet Prep: NONE SEEN
YEAST WET PREP: NONE SEEN

## 2013-08-05 LAB — GC/CHLAMYDIA PROBE AMP
CT Probe RNA: NEGATIVE
GC PROBE AMP APTIMA: NEGATIVE

## 2013-08-05 MED ORDER — METRONIDAZOLE 500 MG PO TABS: 500.0000 mg | ORAL_TABLET | Freq: Two times a day (BID) | ORAL | Status: DC

## 2013-08-05 NOTE — Discharge Instructions (Signed)

## 2013-08-06 LAB — URINE CULTURE
CULTURE: NO GROWTH
Colony Count: NO GROWTH

## 2013-08-13 ENCOUNTER — Inpatient Hospital Stay (HOSPITAL_COMMUNITY): Payer: 59

## 2013-08-13 ENCOUNTER — Inpatient Hospital Stay (HOSPITAL_COMMUNITY)
Admission: AD | Admit: 2013-08-13 | Discharge: 2013-08-13 | Disposition: A | Discharge status: Home / Self Care | Payer: 59 | Source: Ambulatory Visit | Attending: Obstetrics | Admitting: Obstetrics

## 2013-08-13 ENCOUNTER — Encounter (HOSPITAL_COMMUNITY): Payer: Self-pay | Admitting: *Deleted

## 2013-08-13 DIAGNOSIS — O4700 False labor before 37 completed weeks of gestation, unspecified trimester: Secondary | ICD-10-CM | POA: Insufficient documentation

## 2013-08-13 DIAGNOSIS — O99891 Other specified diseases and conditions complicating pregnancy: Secondary | ICD-10-CM | POA: Insufficient documentation

## 2013-08-13 DIAGNOSIS — R109 Unspecified abdominal pain: Secondary | ICD-10-CM | POA: Insufficient documentation

## 2013-08-13 DIAGNOSIS — K219 Gastro-esophageal reflux disease without esophagitis: Secondary | ICD-10-CM

## 2013-08-13 DIAGNOSIS — O9989 Other specified diseases and conditions complicating pregnancy, childbirth and the puerperium: Secondary | ICD-10-CM

## 2013-08-13 LAB — URINALYSIS, ROUTINE W REFLEX MICROSCOPIC
Bilirubin Urine: NEGATIVE
GLUCOSE, UA: NEGATIVE mg/dL
KETONES UR: 15 mg/dL — AB
Nitrite: NEGATIVE
Protein, ur: NEGATIVE mg/dL
Specific Gravity, Urine: 1.025 (ref 1.005–1.030)
UROBILINOGEN UA: 0.2 mg/dL (ref 0.0–1.0)
pH: 7 (ref 5.0–8.0)

## 2013-08-13 LAB — WET PREP, GENITAL
Clue Cells Wet Prep HPF POC: NONE SEEN
TRICH WET PREP: NONE SEEN
Yeast Wet Prep HPF POC: NONE SEEN

## 2013-08-13 LAB — AMNISURE RUPTURE OF MEMBRANE (ROM) NOT AT ARMC: AMNISURE: NEGATIVE

## 2013-08-13 LAB — URINE MICROSCOPIC-ADD ON

## 2013-08-13 MED ORDER — RANITIDINE HCL 150 MG PO TABS: 150.0000 mg | ORAL_TABLET | Freq: Two times a day (BID) | ORAL | Status: DC

## 2013-08-13 NOTE — MAU Note (Signed)
I have thrown up the enitre preg. Last night I vomited about 0230 and leaked a lot of fld. Every time I throw up i usually leak urine but this was more than usual. Have thrown up 5 times today and each time leak more fld than normal. MD told me to come be checked

## 2013-08-13 NOTE — Progress Notes (Signed)
Spec exam done. FFN and wet prep sent

## 2013-08-13 NOTE — Progress Notes (Signed)
Unable to see cervix on spec exam and did not check cervix due to cerclage

## 2013-08-13 NOTE — Discharge Instructions (Signed)
Preterm Labor Information Preterm labor is when labor starts at less than 37 weeks of pregnancy. The normal length of a pregnancy is 39 to 41 weeks. CAUSES Often, there is no identifiable underlying cause as to why a woman goes into preterm labor. One of the most common known causes of preterm labor is infection. Infections of the uterus, cervix, vagina, amniotic sac, bladder, kidney, or even the lungs (pneumonia) can cause labor to start. Other suspected causes of preterm labor include:   Urogenital infections, such as yeast infections and bacterial vaginosis.   Uterine abnormalities (uterine shape, uterine septum, fibroids, or bleeding from the placenta).   A cervix that has been operated on (it may fail to stay closed).   Malformations in the fetus.   Multiple gestations (twins, triplets, and so on).   Breakage of the amniotic sac.  RISK FACTORS  Having a previous history of preterm labor.   Having premature rupture of membranes (PROM).   Having a placenta that covers the opening of the cervix (placenta previa).   Having a placenta that separates from the uterus (placental abruption).   Having a cervix that is too weak to hold the fetus in the uterus (incompetent cervix).   Having too much fluid in the amniotic sac (polyhydramnios).   Taking illegal drugs or smoking while pregnant.   Not gaining enough weight while pregnant.   Being younger than 13 and older than 38 years old.   Having a low socioeconomic status.   Being African American. SYMPTOMS Signs and symptoms of preterm labor include:   Menstrual-like cramps, abdominal pain, or back pain.  Uterine contractions that are regular, as frequent as six in an hour, regardless of their intensity (may be mild or painful).  Contractions that start on the top of the uterus and spread down to the lower abdomen and back.   A sense of increased pelvic pressure.   A watery or bloody mucus discharge that  comes from the vagina.  TREATMENT Depending on the length of the pregnancy and other circumstances, your health care provider may suggest bed rest. If necessary, there are medicines that can be given to stop contractions and to mature the fetal lungs. If labor happens before 34 weeks of pregnancy, a prolonged hospital stay may be recommended. Treatment depends on the condition of both you and the fetus.  WHAT SHOULD YOU DO IF YOU THINK YOU ARE IN PRETERM LABOR? Call your health care provider right away. You will need to go to the hospital to get checked immediately. HOW CAN YOU PREVENT PRETERM LABOR IN FUTURE PREGNANCIES? You should:   Stop smoking if you smoke.  Maintain healthy weight gain and avoid chemicals and drugs that are not necessary.  Be watchful for any type of infection.  Inform your health care provider if you have a known history of preterm labor. Document Released: 09/30/2003 Document Revised: 03/12/2013 Document Reviewed: 08/12/2012 [facility] Patient Information 2014 [place], [place].  Gastroesophageal Reflux Disease, Adult Gastroesophageal reflux disease (GERD) happens when acid from your stomach flows up into the esophagus. When acid comes in contact with the esophagus, the acid causes soreness (inflammation) in the esophagus. Over time, GERD may create small holes (ulcers) in the lining of the esophagus. CAUSES   Increased body weight. This puts pressure on the stomach, making acid rise from the stomach into the esophagus.  Smoking. This increases acid production in the stomach.  Drinking alcohol. This causes decreased pressure in the lower esophageal sphincter (valve or  ring of muscle between the esophagus and stomach), allowing acid from the stomach into the esophagus.  Late evening meals and a full stomach. This increases pressure and acid production in the stomach.  A malformed lower esophageal sphincter. Sometimes, no cause is found. SYMPTOMS   Burning pain  in the lower part of the mid-chest behind the breastbone and in the mid-stomach area. This may occur twice a week or more often.  Trouble swallowing.  Sore throat.  Dry cough.  Asthma-like symptoms including chest tightness, shortness of breath, or wheezing. DIAGNOSIS  Your caregiver may be able to diagnose GERD based on your symptoms. In some cases, X-rays and other tests may be done to check for complications or to check the condition of your stomach and esophagus. TREATMENT  Your caregiver may recommend over-the-counter or prescription medicines to help decrease acid production. Ask your caregiver before starting or adding any new medicines.  HOME CARE INSTRUCTIONS   Change the factors that you can control. Ask your caregiver for guidance concerning weight loss, quitting smoking, and alcohol consumption.  Avoid foods and drinks that make your symptoms worse, such as:  Caffeine or alcoholic drinks.  Chocolate.  Peppermint or mint flavorings.  Garlic and onions.  Spicy foods.  Citrus fruits, such as oranges, lemons, or limes.  Tomato-based foods such as sauce, chili, salsa, and pizza.  Fried and fatty foods.  Avoid lying down for the 3 hours prior to your bedtime or prior to taking a nap.  Eat small, frequent meals instead of large meals.  Wear loose-fitting clothing. Do not wear anything tight around your waist that causes pressure on your stomach.  Raise the head of your bed 6 to 8 inches with wood blocks to help you sleep. Extra pillows will not help.  Only take over-the-counter or prescription medicines for pain, discomfort, or fever as directed by your caregiver.  Do not take aspirin, ibuprofen, or other nonsteroidal anti-inflammatory drugs (NSAIDs). SEEK IMMEDIATE MEDICAL CARE IF:   You have pain in your arms, neck, jaw, teeth, or back.  Your pain increases or changes in intensity or duration.  You develop nausea, vomiting, or sweating (diaphoresis).  You  develop shortness of breath, or you faint.  Your vomit is green, yellow, black, or looks like coffee grounds or blood.  Your stool is red, bloody, or black. These symptoms could be signs of other problems, such as heart disease, gastric bleeding, or esophageal bleeding. MAKE SURE YOU:   Understand these instructions.  Will watch your condition.  Will get help right away if you are not doing well or get worse. Document Released: 04/19/2005 Document Revised: 10/02/2011 Document Reviewed: 01/27/2011 [facility] Patient Information 2014 [place], [place].

## 2013-08-13 NOTE — MAU Provider Note (Signed)
Chief Complaint:  Rupture of Membranes and Contractions   First Provider Initiated Contact with Patient 08/13/13 1523      HPI: Carol Logan is a 38 y.o. [id number] at [redacted]w[redacted]d who presents to maternity admissions reporting leakage of clear fluid x several episodes when vomiting with smaller amount of leakage in between.  She also reports abdominal cramping off and on since yesterday, described as "a few times per hour".  She reports n/v throughout pregnancy with vomiting 3-4x/day currently.  Phenergan helps but makes her sleepy and feel hung over the next day.  She takes Zofran during the day which helps some but not as much as Phenergan.  She also has heartburn daily, not well treated with Tums.  She reports good fetal movement, denies vaginal bleeding, vaginal itching/burning, urinary symptoms, h/a, dizziness, or fever/chills.     Past Medical History: Past Medical History  Diagnosis Date  . Fibroids   . Ovarian cyst   . No pertinent past medical history   . Headache(784.0)     Past obstetric history: OB History  Gravida Para Term Preterm AB SAB TAB Ectopic Multiple Living  5 1 1  0 3 1 2  0 0 1    # Outcome Date GA Lbr Len/2nd Weight Sex Delivery Anes PTL Lv  5 CUR           4 SAB           3 TRM      SVD   Y  2 TAB           1 TAB               Past Surgical History: Past Surgical History  Procedure Laterality Date  . Foot surgery    . Myomectomy  10/11/2011    Procedure: MYOMECTOMY;  Surgeon: [name], MD;  Location: [facility];  Service: Gynecology;  Laterality: N/A;  . Cervical cerclage N/A 04/01/2013    Procedure: CERCLAGE CERVICAL;  Surgeon: [name], MD;  Location: [facility];  Service: Gynecology;  Laterality: N/A;    Family History: Family History  Problem Relation Age of Onset  . High blood pressure Mother   . Glaucoma Paternal Uncle   . Alzheimer's disease Paternal Uncle   . Cancer Neg Hx   . Depression Neg Hx   . Diabetes Neg Hx   . Drug abuse  Neg Hx   . Early death Neg Hx   . Alcohol abuse Neg Hx   . Heart disease Neg Hx   . Hyperlipidemia Neg Hx   . Hypertension Neg Hx   . Kidney disease Neg Hx   . Stroke Neg Hx     Social History: History  Substance Use Topics  . Smoking status: Never Smoker   . Smokeless tobacco: Never Used  . Alcohol Use: No     Comment: socially- 1 glass of wine monthly    Allergies: No Known Allergies  Meds:  Prescriptions prior to admission  Medication Sig Dispense Refill  . NIFEdipine (PROCARDIA PO) Take by mouth.      . ondansetron (ZOFRAN-ODT) 4 MG disintegrating tablet Take 4 mg by mouth every 8 (eight) hours as needed for nausea.      . Prenatal Vit-Fe Fumarate-FA (PRENATAL MULTIVITAMIN) TABS tablet Take 1 tablet by mouth daily at 12 noon.      . progesterone (PROMETRIUM) 200 MG capsule Place 200 mg vaginally daily.      . promethazine (PHENERGAN) 25 MG  tablet Take 1 tablet (25 mg total) by mouth every 6 (six) hours as needed for nausea.  30 tablet  1  . metroNIDAZOLE (FLAGYL) 500 MG tablet Take 1 tablet (500 mg total) by mouth 2 (two) times daily.  14 tablet  0    ROS: Pertinent findings in history of present illness.  Physical Exam  Blood pressure 133/75, pulse 97, temperature 98.2 F (36.8 C), height 5\' 5"  (1.651 m), weight 84.823 kg (187 lb), last menstrual period 01/04/2013. GENERAL: Well-developed, well-nourished female in no acute distress.  HEENT: normocephalic HEART: normal rate RESP: normal effort ABDOMEN: Soft, non-tender, gravid appropriate for gestational age EXTREMITIES: Nontender, no edema NEURO: alert and oriented Pelvic exam: Cervix visualized, difficulty visualized r/t discharge and pt discomfort with exam, unable to visualize cerclage, negative pooling of fluid, some milky white discharge, vaginal walls and external genitalia normal Cervical exam deferred    FHT:  Baseline 145, moderate variability, accelerations present, no decelerations Contractions:  occasional on [place], mild to palpation   Labs: Results for orders placed during the hospital encounter of 08/13/13 (from the past 24 hour(s))  URINALYSIS, ROUTINE W REFLEX MICROSCOPIC     Status: Abnormal   Collection Time    08/13/13  3:00 PM      Result Value Range   Color, Urine YELLOW  YELLOW   APPearance CLEAR  CLEAR   Specific Gravity, Urine 1.025  1.005 - 1.030   pH 7.0  5.0 - 8.0   Glucose, UA NEGATIVE  NEGATIVE mg/dL   Hgb urine dipstick TRACE (*) NEGATIVE   Bilirubin Urine NEGATIVE  NEGATIVE   Ketones, ur 15 (*) NEGATIVE mg/dL   Protein, ur NEGATIVE  NEGATIVE mg/dL   Urobilinogen, UA 0.2  0.0 - 1.0 mg/dL   Nitrite NEGATIVE  NEGATIVE   Leukocytes, UA TRACE (*) NEGATIVE  URINE MICROSCOPIC-ADD ON     Status: Abnormal   Collection Time    08/13/13  3:00 PM      Result Value Range   Squamous Epithelial / LPF MANY (*) RARE   WBC, UA 0-2  <3 WBC/hpf   RBC / HPF 3-6  <3 RBC/hpf   Bacteria, UA MANY (*) RARE  AMNISURE RUPTURE OF MEMBRANE (ROM)     Status: None   Collection Time    08/13/13  3:40 PM      Result Value Range   Amnisure ROM NEGATIVE    WET PREP, GENITAL     Status: Abnormal   Collection Time    08/13/13  3:40 PM      Result Value Range   Yeast Wet Prep HPF POC NONE SEEN  NONE SEEN   Trich, Wet Prep NONE SEEN  NONE SEEN   Clue Cells Wet Prep HPF POC NONE SEEN  NONE SEEN   WBC, Wet Prep HPF POC MANY (*) NONE SEEN    Imaging:   ED Course Amnisure, Limited OB U/S for AFI and cervical length  Assessment: 1. Acid reflux   2. Threatened preterm labor     Plan: Reviewed normal lab results including AFI and Amnisure and 3.8cm cervical length with pt.  Reassurance provided. Discharge home PTL precautions Zantac 150 mg BID F/U with Dr Ruthann Cancer Return to MAU as needed       Follow-up Information   Follow up with [name], MD.   Specialty:  Obstetrics and Gynecology   Contact information:   [address] [place]  [address] [phone]  Medication List    STOP taking these medications       metroNIDAZOLE 500 MG tablet  Commonly known as:  FLAGYL      TAKE these medications       ondansetron 4 MG disintegrating tablet  Commonly known as:  ZOFRAN-ODT  Take 4 mg by mouth every 8 (eight) hours as needed for nausea.     prenatal multivitamin Tabs tablet  Take 1 tablet by mouth daily at 12 noon.     PROCARDIA PO  Take by mouth.     progesterone 200 MG capsule  Commonly known as:  PROMETRIUM  Place 200 mg vaginally daily.     promethazine 25 MG tablet  Commonly known as:  PHENERGAN  Take 1 tablet (25 mg total) by mouth every 6 (six) hours as needed for nausea.     ranitidine 150 MG tablet  Commonly known as:  ZANTAC  Take 1 tablet (150 mg total) by mouth 2 (two) times daily.        Fatima Blank Certified Nurse-Midwife 08/13/2013 5:20 PM

## 2013-08-13 NOTE — MAU Note (Signed)
Fatima Blank CNM in to discuss test results and d/c plan.

## 2013-08-13 NOTE — Progress Notes (Signed)
OK to d/c efm per Fatima Blank CNM

## 2013-08-13 NOTE — Progress Notes (Signed)
1512 Pt raised legs and temporarily recorded maternal HR.

## 2013-08-13 NOTE — Progress Notes (Signed)
Written and verbal d/c instructions given and understanding voiced. 

## 2013-08-15 ENCOUNTER — Other Ambulatory Visit (HOSPITAL_COMMUNITY): Payer: Self-pay | Admitting: Obstetrics

## 2013-08-15 DIAGNOSIS — O09529 Supervision of elderly multigravida, unspecified trimester: Secondary | ICD-10-CM

## 2013-08-15 DIAGNOSIS — O3490 Maternal care for abnormality of pelvic organ, unspecified, unspecified trimester: Secondary | ICD-10-CM

## 2013-08-20 ENCOUNTER — Ambulatory Visit (HOSPITAL_COMMUNITY)
Admission: RE | Admit: 2013-08-20 | Discharge: 2013-08-20 | Disposition: A | Discharge status: Home / Self Care | Payer: 59 | Source: Ambulatory Visit | Attending: Obstetrics | Admitting: Obstetrics

## 2013-08-20 DIAGNOSIS — O3430 Maternal care for cervical incompetence, unspecified trimester: Secondary | ICD-10-CM | POA: Insufficient documentation

## 2013-08-20 DIAGNOSIS — O3490 Maternal care for abnormality of pelvic organ, unspecified, unspecified trimester: Secondary | ICD-10-CM | POA: Insufficient documentation

## 2013-08-20 DIAGNOSIS — O09529 Supervision of elderly multigravida, unspecified trimester: Secondary | ICD-10-CM | POA: Insufficient documentation

## 2013-08-28 ENCOUNTER — Other Ambulatory Visit: Payer: Self-pay | Admitting: Obstetrics

## 2013-09-05 ENCOUNTER — Encounter (HOSPITAL_COMMUNITY): Payer: Self-pay | Admitting: Pharmacist

## 2013-09-10 NOTE — H&P (Signed)
NAMELEEASIA, SECRIST           ACCOUNT NO.:  [id number]  MEDICAL RECORD NO.:  [id number]  LOCATION:  [facility]                         FACILITY:  [facility]  PHYSICIAN:  [name], M.D.DATE OF BIRTH:  11-11-1975  DATE OF ADMISSION:  08/13/2013 DATE OF DISCHARGE:                             HISTORY & PHYSICAL   HISTORY OF PRESENT ILLNESS:  This is a 38 year old, gravida 4, para 1-0- 1-1.  The patient's EDC is 10/11/2013.  In 2000, she had a normal vaginal delivery.  In 2005, had an abortion.  In 2013, at 20 weeks, had a ruptured membrane spontaneously and history of an incompetent cervix, and a spontaneous abortion.  She subsequently had multiple myomectomy for large myomas, and with this pregnancy, cerclage was placed, which was removed on 08/27/2013.  She has been followed by MFM and she is now in for primary low transverse cesarean section because of a previous multiple myomectomy and tubal ligation because of multiparity.  PAST MEDICAL HISTORY:  Negative.  PAST SURGICAL HISTORY:  Cervical cerclage and multiple myomectomy.  REVIEW OF SYSTEMS:  Noncontributory.  PHYSICAL EXAMINATION:  GENERAL:  Well-developed female, in no distress. HEENT:  Negative. LUNGS:  Clear to P and A. HEART:  Regular rhythm.  No murmurs, no gallops. ABDOMEN:  Uterus 38 week size.  Pelvic cervix closed, long, and post removal of cerclage. EXTREMITIES:  Negative.          ______________________________ [name], M.D.     BAM/MEDQ  D:  09/10/2013  T:  09/10/2013  Job:  [id number]

## 2013-09-16 ENCOUNTER — Encounter (HOSPITAL_COMMUNITY): Payer: Self-pay

## 2013-09-17 ENCOUNTER — Encounter (HOSPITAL_COMMUNITY)
Admission: RE | Admit: 2013-09-17 | Discharge: 2013-09-17 | Disposition: A | Discharge status: Home / Self Care | Payer: 59 | Source: Ambulatory Visit | Attending: Obstetrics | Admitting: Obstetrics

## 2013-09-17 ENCOUNTER — Encounter (HOSPITAL_COMMUNITY): Payer: Self-pay

## 2013-09-17 LAB — CBC
HCT: 32.7 % — ABNORMAL LOW (ref 36.0–46.0)
Hemoglobin: 10.6 g/dL — ABNORMAL LOW (ref 12.0–15.0)
MCH: 26.1 pg (ref 26.0–34.0)
MCHC: 32.4 g/dL (ref 30.0–36.0)
MCV: 80.5 fL (ref 78.0–100.0)
PLATELETS: 170 10*3/uL (ref 150–400)
RBC: 4.06 MIL/uL (ref 3.87–5.11)
RDW: 14.7 % (ref 11.5–15.5)
WBC: 5 10*3/uL (ref 4.0–10.5)

## 2013-09-17 LAB — RPR: RPR: NONREACTIVE

## 2013-09-17 LAB — TYPE AND SCREEN
ABO/RH(D): A POS
ANTIBODY SCREEN: NEGATIVE

## 2013-09-17 NOTE — Patient Instructions (Signed)
[place]  09/17/2013   Your procedure is scheduled on:  09/18/13  Enter through the Main Entrance of [facility] at 8 AM.  Pick up the phone at the desk and dial [phone].   Call this number if you have problems the morning of surgery: [phone]   Remember:   Do not eat food:After Midnight.  Do not drink clear liquids: After Midnight.  Take these medicines the morning of surgery with A SIP OF WATER: NA   Do not wear jewelry, make-up or nail polish.  Do not wear lotions, powders, or perfumes. You may wear deodorant.  Do not shave 48 hours prior to surgery.  Do not bring valuables to the hospital.  [facility] is not   responsible for any belongings or valuables brought to the hospital.  Contacts, dentures or bridgework may not be worn into surgery.  Leave suitcase in the car. After surgery it may be brought to your room.  For patients admitted to the hospital, checkout time is 11:00 AM the day of              discharge.   Patients discharged the day of surgery will not be allowed to drive             home.  Name and phone number of your driver: NA  Special Instructions:   Shower using CHG 2 nights before surgery and the night before surgery.  If you shower the day of surgery use CHG.  Use special wash - you have one bottle of CHG for all showers.  You should use approximately 1/3 of the bottle for each shower.   Please read over the following fact sheets that you were given:   Surgical Site Infection Prevention

## 2013-09-18 ENCOUNTER — Encounter (HOSPITAL_COMMUNITY)
Admission: RE | Disposition: A | Discharge status: Home / Self Care | Payer: Self-pay | Source: Ambulatory Visit | Attending: Obstetrics

## 2013-09-18 ENCOUNTER — Encounter (HOSPITAL_COMMUNITY): Payer: Self-pay | Admitting: Anesthesiology

## 2013-09-18 ENCOUNTER — Inpatient Hospital Stay (HOSPITAL_COMMUNITY)
Admission: RE | Admit: 2013-09-18 | Discharge: 2013-09-21 | DRG: 766 | Disposition: A | Discharge status: Home / Self Care | Payer: 59 | Source: Ambulatory Visit | Attending: Obstetrics | Admitting: Obstetrics

## 2013-09-18 ENCOUNTER — Inpatient Hospital Stay (HOSPITAL_COMMUNITY): Payer: 59 | Admitting: Registered Nurse

## 2013-09-18 ENCOUNTER — Encounter (HOSPITAL_COMMUNITY): Payer: 59 | Admitting: Registered Nurse

## 2013-09-18 DIAGNOSIS — O9902 Anemia complicating childbirth: Secondary | ICD-10-CM | POA: Diagnosis present

## 2013-09-18 DIAGNOSIS — D649 Anemia, unspecified: Secondary | ICD-10-CM | POA: Diagnosis present

## 2013-09-18 DIAGNOSIS — K219 Gastro-esophageal reflux disease without esophagitis: Secondary | ICD-10-CM | POA: Diagnosis present

## 2013-09-18 DIAGNOSIS — O09529 Supervision of elderly multigravida, unspecified trimester: Secondary | ICD-10-CM | POA: Diagnosis present

## 2013-09-18 DIAGNOSIS — O0940 Supervision of pregnancy with grand multiparity, unspecified trimester: Secondary | ICD-10-CM

## 2013-09-18 DIAGNOSIS — O3490 Maternal care for abnormality of pelvic organ, unspecified, unspecified trimester: Principal | ICD-10-CM | POA: Diagnosis present

## 2013-09-18 DIAGNOSIS — Z302 Encounter for sterilization: Secondary | ICD-10-CM

## 2013-09-18 DIAGNOSIS — Z98891 History of uterine scar from previous surgery: Secondary | ICD-10-CM

## 2013-09-18 SURGERY — Surgical Case
Anesthesia: Spinal | Site: Abdomen

## 2013-09-18 MED ORDER — CEFAZOLIN SODIUM-DEXTROSE 2-3 GM-% IV SOLR: 2.0000 g | Freq: Once | INTRAVENOUS | Status: DC

## 2013-09-18 MED ORDER — MEPERIDINE HCL 25 MG/ML IJ SOLN: 6.2500 mg | INTRAMUSCULAR | Status: DC | PRN

## 2013-09-18 MED ORDER — KETOROLAC TROMETHAMINE 30 MG/ML IJ SOLN
INTRAMUSCULAR | Status: AC
  Administered 2013-09-18: 30 mg via INTRAMUSCULAR
  Filled 2013-09-18: qty 1

## 2013-09-18 MED ORDER — SIMETHICONE 80 MG PO CHEW
80.0000 mg | CHEWABLE_TABLET | Freq: Three times a day (TID) | ORAL | Status: DC
  Administered 2013-09-18 – 2013-09-20 (×6): 80 mg via ORAL
  Filled 2013-09-18 (×7): qty 1

## 2013-09-18 MED ORDER — LACTATED RINGERS IV SOLN
INTRAVENOUS | Status: DC | PRN
  Administered 2013-09-18: 09:00:00 via INTRAVENOUS

## 2013-09-18 MED ORDER — ONDANSETRON HCL 4 MG/2ML IJ SOLN: 4.0000 mg | Freq: Three times a day (TID) | INTRAMUSCULAR | Status: DC | PRN

## 2013-09-18 MED ORDER — METOCLOPRAMIDE HCL 5 MG/ML IJ SOLN: 10.0000 mg | Freq: Three times a day (TID) | INTRAMUSCULAR | Status: DC | PRN

## 2013-09-18 MED ORDER — DIBUCAINE 1 % RE OINT: 1.0000 "application " | TOPICAL_OINTMENT | RECTAL | Status: DC | PRN

## 2013-09-18 MED ORDER — DEXAMETHASONE SODIUM PHOSPHATE 10 MG/ML IJ SOLN
INTRAMUSCULAR | Status: DC | PRN
  Administered 2013-09-18: 10 mg via INTRAVENOUS

## 2013-09-18 MED ORDER — MORPHINE SULFATE 0.5 MG/ML IJ SOLN
INTRAMUSCULAR | Status: AC
  Filled 2013-09-18: qty 10

## 2013-09-18 MED ORDER — ONDANSETRON HCL 4 MG/2ML IJ SOLN
INTRAMUSCULAR | Status: DC | PRN
  Administered 2013-09-18: 4 mg via INTRAVENOUS

## 2013-09-18 MED ORDER — SODIUM CHLORIDE 0.9 % IJ SOLN: 3.0000 mL | INTRAMUSCULAR | Status: DC | PRN

## 2013-09-18 MED ORDER — DEXTROSE 5 % IV SOLN
1.0000 ug/kg/h | INTRAVENOUS | Status: DC | PRN
  Filled 2013-09-18: qty 2

## 2013-09-18 MED ORDER — SIMETHICONE 80 MG PO CHEW: 80.0000 mg | CHEWABLE_TABLET | ORAL | Status: DC | PRN

## 2013-09-18 MED ORDER — ONDANSETRON HCL 4 MG PO TABS: 4.0000 mg | ORAL_TABLET | ORAL | Status: DC | PRN

## 2013-09-18 MED ORDER — ONDANSETRON HCL 4 MG/2ML IJ SOLN
INTRAMUSCULAR | Status: AC
  Filled 2013-09-18: qty 2

## 2013-09-18 MED ORDER — PHENYLEPHRINE 40 MCG/ML (10ML) SYRINGE FOR IV PUSH (FOR BLOOD PRESSURE SUPPORT)
PREFILLED_SYRINGE | INTRAVENOUS | Status: AC
  Filled 2013-09-18: qty 5

## 2013-09-18 MED ORDER — DIPHENHYDRAMINE HCL 50 MG/ML IJ SOLN: 12.5000 mg | INTRAMUSCULAR | Status: DC | PRN

## 2013-09-18 MED ORDER — IBUPROFEN 600 MG PO TABS: 600.0000 mg | ORAL_TABLET | Freq: Four times a day (QID) | ORAL | Status: DC

## 2013-09-18 MED ORDER — LIDOCAINE 1%/NA BICARB 0.1 MEQ INJECTION
INJECTION | INTRAVENOUS | Status: AC
  Filled 2013-09-18: qty 1

## 2013-09-18 MED ORDER — FENTANYL CITRATE 0.05 MG/ML IJ SOLN
INTRAMUSCULAR | Status: AC
  Administered 2013-09-18: 50 ug via INTRAVENOUS
  Filled 2013-09-18: qty 2

## 2013-09-18 MED ORDER — SIMETHICONE 80 MG PO CHEW
80.0000 mg | CHEWABLE_TABLET | ORAL | Status: DC
  Administered 2013-09-18 – 2013-09-21 (×3): 80 mg via ORAL
  Filled 2013-09-18 (×3): qty 1

## 2013-09-18 MED ORDER — FENTANYL CITRATE 0.05 MG/ML IJ SOLN
INTRAMUSCULAR | Status: DC | PRN
  Administered 2013-09-18: 25 ug via INTRATHECAL

## 2013-09-18 MED ORDER — OXYCODONE-ACETAMINOPHEN 5-325 MG PO TABS
1.0000 | ORAL_TABLET | ORAL | Status: DC | PRN
  Administered 2013-09-19 (×3): 1 via ORAL
  Administered 2013-09-20 – 2013-09-21 (×5): 2 via ORAL
  Filled 2013-09-18 (×4): qty 2
  Filled 2013-09-18 (×4): qty 1
  Filled 2013-09-18: qty 2

## 2013-09-18 MED ORDER — IBUPROFEN 600 MG PO TABS
600.0000 mg | ORAL_TABLET | Freq: Four times a day (QID) | ORAL | Status: DC
  Administered 2013-09-18 – 2013-09-21 (×11): 600 mg via ORAL
  Filled 2013-09-18 (×11): qty 1

## 2013-09-18 MED ORDER — SCOPOLAMINE 1 MG/3DAYS TD PT72
1.0000 | MEDICATED_PATCH | Freq: Once | TRANSDERMAL | Status: DC
  Filled 2013-09-18: qty 1

## 2013-09-18 MED ORDER — MENTHOL 3 MG MT LOZG: 1.0000 | LOZENGE | OROMUCOSAL | Status: DC | PRN

## 2013-09-18 MED ORDER — MORPHINE SULFATE (PF) 0.5 MG/ML IJ SOLN
INTRAMUSCULAR | Status: DC | PRN
  Administered 2013-09-18: .15 mg via EPIDURAL

## 2013-09-18 MED ORDER — LACTATED RINGERS IV SOLN
INTRAVENOUS | Status: DC
  Administered 2013-09-18 (×3): via INTRAVENOUS

## 2013-09-18 MED ORDER — DIPHENHYDRAMINE HCL 50 MG/ML IJ SOLN: 25.0000 mg | INTRAMUSCULAR | Status: DC | PRN

## 2013-09-18 MED ORDER — CEFAZOLIN SODIUM-DEXTROSE 2-3 GM-% IV SOLR
INTRAVENOUS | Status: AC
  Administered 2013-09-18: 2 g via INTRAVENOUS
  Filled 2013-09-18: qty 50

## 2013-09-18 MED ORDER — SCOPOLAMINE 1 MG/3DAYS TD PT72
MEDICATED_PATCH | TRANSDERMAL | Status: AC
  Administered 2013-09-18: 1.5 mg via TRANSDERMAL
  Filled 2013-09-18: qty 1

## 2013-09-18 MED ORDER — OXYTOCIN 40 UNITS IN LACTATED RINGERS INFUSION - SIMPLE MED: 62.5000 mL/h | INTRAVENOUS | Status: AC

## 2013-09-18 MED ORDER — ONDANSETRON HCL 4 MG/2ML IJ SOLN: 4.0000 mg | Freq: Once | INTRAMUSCULAR | Status: DC | PRN

## 2013-09-18 MED ORDER — WITCH HAZEL-GLYCERIN EX PADS: 1.0000 "application " | MEDICATED_PAD | CUTANEOUS | Status: DC | PRN

## 2013-09-18 MED ORDER — KETOROLAC TROMETHAMINE 30 MG/ML IJ SOLN
30.0000 mg | Freq: Four times a day (QID) | INTRAMUSCULAR | Status: AC | PRN
  Administered 2013-09-18: 30 mg via INTRAMUSCULAR

## 2013-09-18 MED ORDER — LACTATED RINGERS IV SOLN
INTRAVENOUS | Status: DC
  Administered 2013-09-18: 1 mL via INTRAVENOUS

## 2013-09-18 MED ORDER — PRENATAL MULTIVITAMIN CH
1.0000 | ORAL_TABLET | Freq: Every day | ORAL | Status: DC
  Administered 2013-09-19 – 2013-09-20 (×2): 1 via ORAL
  Filled 2013-09-18 (×2): qty 1

## 2013-09-18 MED ORDER — KETOROLAC TROMETHAMINE 30 MG/ML IJ SOLN: 30.0000 mg | Freq: Four times a day (QID) | INTRAMUSCULAR | Status: AC | PRN

## 2013-09-18 MED ORDER — OXYTOCIN 10 UNIT/ML IJ SOLN
INTRAMUSCULAR | Status: AC
  Filled 2013-09-18: qty 4

## 2013-09-18 MED ORDER — OXYTOCIN 40 UNITS IN LACTATED RINGERS INFUSION - SIMPLE MED
INTRAVENOUS | Status: DC | PRN
  Administered 2013-09-18: 40 [IU] via INTRAVENOUS

## 2013-09-18 MED ORDER — LACTATED RINGERS IV SOLN
Freq: Once | INTRAVENOUS | Status: AC
  Administered 2013-09-18: 08:00:00 via INTRAVENOUS

## 2013-09-18 MED ORDER — TETANUS-DIPHTH-ACELL PERTUSSIS 5-2.5-18.5 LF-MCG/0.5 IM SUSP: 0.5000 mL | Freq: Once | INTRAMUSCULAR | Status: DC

## 2013-09-18 MED ORDER — NALBUPHINE HCL 10 MG/ML IJ SOLN: 5.0000 mg | INTRAMUSCULAR | Status: DC | PRN

## 2013-09-18 MED ORDER — SENNOSIDES-DOCUSATE SODIUM 8.6-50 MG PO TABS
2.0000 | ORAL_TABLET | ORAL | Status: DC
  Administered 2013-09-18 – 2013-09-21 (×3): 2 via ORAL
  Filled 2013-09-18 (×3): qty 2

## 2013-09-18 MED ORDER — ACETAMINOPHEN 160 MG/5ML PO SOLN: 325.0000 mg | ORAL | Status: DC | PRN

## 2013-09-18 MED ORDER — PHENYLEPHRINE HCL 10 MG/ML IJ SOLN
INTRAMUSCULAR | Status: AC
  Filled 2013-09-18: qty 1

## 2013-09-18 MED ORDER — KETOROLAC TROMETHAMINE 30 MG/ML IJ SOLN: 15.0000 mg | Freq: Once | INTRAMUSCULAR | Status: DC | PRN

## 2013-09-18 MED ORDER — LANOLIN HYDROUS EX OINT: 1.0000 "application " | TOPICAL_OINTMENT | CUTANEOUS | Status: DC | PRN

## 2013-09-18 MED ORDER — PHENYLEPHRINE HCL 10 MG/ML IJ SOLN
INTRAMUSCULAR | Status: DC | PRN
  Administered 2013-09-18 (×2): 80 ug via INTRAVENOUS

## 2013-09-18 MED ORDER — NALOXONE HCL 0.4 MG/ML IJ SOLN: 0.4000 mg | INTRAMUSCULAR | Status: DC | PRN

## 2013-09-18 MED ORDER — ZOLPIDEM TARTRATE 5 MG PO TABS: 5.0000 mg | ORAL_TABLET | Freq: Every evening | ORAL | Status: DC | PRN

## 2013-09-18 MED ORDER — DEXAMETHASONE SODIUM PHOSPHATE 10 MG/ML IJ SOLN
INTRAMUSCULAR | Status: AC
  Filled 2013-09-18: qty 1

## 2013-09-18 MED ORDER — DIPHENHYDRAMINE HCL 25 MG PO CAPS: 25.0000 mg | ORAL_CAPSULE | ORAL | Status: DC | PRN

## 2013-09-18 MED ORDER — FENTANYL CITRATE 0.05 MG/ML IJ SOLN
25.0000 ug | INTRAMUSCULAR | Status: DC | PRN
  Administered 2013-09-18: 50 ug via INTRAVENOUS

## 2013-09-18 MED ORDER — FENTANYL CITRATE 0.05 MG/ML IJ SOLN
INTRAMUSCULAR | Status: AC
  Filled 2013-09-18: qty 2

## 2013-09-18 MED ORDER — ONDANSETRON HCL 4 MG/2ML IJ SOLN: 4.0000 mg | INTRAMUSCULAR | Status: DC | PRN

## 2013-09-18 MED ORDER — DIPHENHYDRAMINE HCL 25 MG PO CAPS: 25.0000 mg | ORAL_CAPSULE | Freq: Four times a day (QID) | ORAL | Status: DC | PRN

## 2013-09-18 MED ORDER — SCOPOLAMINE 1 MG/3DAYS TD PT72
1.0000 | MEDICATED_PATCH | Freq: Once | TRANSDERMAL | Status: DC
  Administered 2013-09-18: 1.5 mg via TRANSDERMAL

## 2013-09-18 MED ORDER — PHENYLEPHRINE 8 MG IN D5W 100 ML (0.08MG/ML) PREMIX OPTIME
INJECTION | INTRAVENOUS | Status: DC | PRN
  Administered 2013-09-18: 45 mg via INTRAVENOUS

## 2013-09-18 MED ORDER — ACETAMINOPHEN 325 MG PO TABS: 325.0000 mg | ORAL_TABLET | ORAL | Status: DC | PRN

## 2013-09-18 SURGICAL SUPPLY — 33 items
ADH SKN CLS APL DERMABOND .7 (GAUZE/BANDAGES/DRESSINGS) ×1
CLAMP CORD UMBIL (MISCELLANEOUS) IMPLANT
CLOTH BEACON ORANGE TIMEOUT ST (SAFETY) ×3 IMPLANT
DERMABOND ADVANCED (GAUZE/BANDAGES/DRESSINGS) ×2
DERMABOND ADVANCED .7 DNX12 (GAUZE/BANDAGES/DRESSINGS) ×1 IMPLANT
DRAPE LG THREE QUARTER DISP (DRAPES) IMPLANT
DRSG OPSITE POSTOP 4X10 (GAUZE/BANDAGES/DRESSINGS) ×3 IMPLANT
DURAPREP 26ML APPLICATOR (WOUND CARE) ×3 IMPLANT
ELECT REM PT RETURN 9FT ADLT (ELECTROSURGICAL) ×3
ELECTRODE REM PT RTRN 9FT ADLT (ELECTROSURGICAL) ×1 IMPLANT
EXTRACTOR VACUUM M CUP 4 TUBE (SUCTIONS) IMPLANT
EXTRACTOR VACUUM M CUP 4' TUBE (SUCTIONS)
GLOVE BIO SURGEON STRL SZ8.5 (GLOVE) ×3 IMPLANT
GOWN STRL REUS W/TWL 2XL LVL3 (GOWN DISPOSABLE) ×3 IMPLANT
GOWN STRL REUS W/TWL LRG LVL3 (GOWN DISPOSABLE) ×3 IMPLANT
KIT ABG SYR 3ML LUER SLIP (SYRINGE) IMPLANT
NDL HYPO 25X5/8 SAFETYGLIDE (NEEDLE) ×1 IMPLANT
NEEDLE HYPO 25X5/8 SAFETYGLIDE (NEEDLE) ×3 IMPLANT
NS IRRIG 1000ML POUR BTL (IV SOLUTION) ×3 IMPLANT
PACK C SECTION WH (CUSTOM PROCEDURE TRAY) ×3 IMPLANT
PAD OB MATERNITY 4.3X12.25 (PERSONAL CARE ITEMS) ×3 IMPLANT
SUT CHROMIC 0 CT 802H (SUTURE) ×3 IMPLANT
SUT CHROMIC 1 CTX 36 (SUTURE) ×6 IMPLANT
SUT CHROMIC 2 0 SH (SUTURE) ×3 IMPLANT
SUT GUT PLAIN 0 CT-3 TAN 27 (SUTURE) IMPLANT
SUT MON AB 4-0 PS1 27 (SUTURE) ×3 IMPLANT
SUT VIC AB 0 CT1 18XCR BRD8 (SUTURE) IMPLANT
SUT VIC AB 0 CT1 8-18 (SUTURE)
SUT VIC AB 0 CTX 36 (SUTURE) ×6
SUT VIC AB 0 CTX36XBRD ANBCTRL (SUTURE) ×2 IMPLANT
TOWEL OR 17X24 6PK STRL BLUE (TOWEL DISPOSABLE) ×3 IMPLANT
TRAY FOLEY CATH 14FR (SET/KITS/TRAYS/PACK) ×3 IMPLANT
WATER STERILE IRR 1000ML POUR (IV SOLUTION) ×3 IMPLANT

## 2013-09-18 NOTE — Anesthesia Postprocedure Evaluation (Signed)
Anesthesia Post Note  Patient: Carol Logan  Procedure(s) Performed: Procedure(s) (LRB): CESAREAN SECTION (N/A)  Anesthesia type: Spinal  Patient location: Mother/Baby  Post pain: Pain level controlled  Post assessment: Post-op Vital signs reviewed  Last Vitals:  Filed Vitals:   09/18/13 1548  BP: 120/76  Pulse: 67  Temp:   Resp:     Post vital signs: Reviewed  Level of consciousness: awake  Complications: No apparent anesthesia complications

## 2013-09-18 NOTE — H&P (Signed)
  There has been no change in the history and physical since the initial dictation in in a

## 2013-09-18 NOTE — Anesthesia Preprocedure Evaluation (Signed)
Anesthesia Evaluation  Patient identified by MRN, date of birth, ID band Patient awake    Reviewed: Allergy & Precautions, H&P , NPO status , Patient's Chart, lab work & pertinent test results  History of Anesthesia Complications Negative for: history of anesthetic complications  Airway Mallampati: II TM Distance: >3 FB Neck ROM: Full    Dental  (+) Teeth Intact   Pulmonary neg pulmonary ROS,    Pulmonary exam normal       Cardiovascular negative cardio ROS  Rhythm:Regular Rate:Normal     Neuro/Psych negative neurological ROS  negative psych ROS   GI/Hepatic Neg liver ROS, GERD-  Medicated and Controlled,  Endo/Other  negative endocrine ROS  Renal/GU negative Renal ROS     Musculoskeletal negative musculoskeletal ROS (+)   Abdominal   Peds  Hematology  (+) anemia ,   Anesthesia Other Findings   Reproductive/Obstetrics (+) Pregnancy Scheduled c section for previous myomectomy, cerclage during pregnancy and since removed                           Anesthesia Physical Anesthesia Plan  ASA: II  Anesthesia Plan: Spinal   Post-op Pain Management:    Induction:   Airway Management Planned: Natural Airway  Additional Equipment: None  Intra-op Plan:   Post-operative Plan:   Informed Consent: I have reviewed the patients History and Physical, chart, labs and discussed the procedure including the risks, benefits and alternatives for the proposed anesthesia with the patient or authorized representative who has indicated his/her understanding and acceptance.   Dental advisory given  Plan Discussed with: CRNA and Surgeon  Anesthesia Plan Comments:         Anesthesia Quick Evaluation

## 2013-09-18 NOTE — OR Nursing (Signed)
SURGEON CHOOSE DERMABOND INSTEAD OF OPSITE

## 2013-09-18 NOTE — Op Note (Signed)
preop diagnosis previous myomectomy at 37 weeks primary low transverse cesarean section and tubal ligation Postop diagnosis is same Anesthesia spinal Surgeon Dr. Gracy Racer First assistant Dr. Baltazar Najjar Procedure patient placed on the operating table in the supine position abdomen prepped and draped bladder emptied with a Foley catheter a transverse suprapubic incision made through the old scar carried   to the rectus fascia fascia cleaned and incised the length of the incision recti muscles retracted laterally peritoneum incised longitudinally transverse incision made on the visceroperitoneum above the bladder bladder mobilized inferiorly transverse low uterine incision made fluid clear patient delivered of a female Apgar 89 placenta was fundal removed manually uterine cavity clean  With  dry laps uterine incision closed in 2 layers hemostasis satisfactory bladder flap reattached  20  chromic the right tube  Grasped  with a Babcock clamp 0 plain sutures   placed in the mesosalpinx below the portion of tube and and   was tied and 1 inch of tube transected procedure done in a similar fashion on  The other   side lap and sponge counts correct abdomen closed in layers peritoneum continuous with of 0 chromic fascia continuous within of 0 Dexon skin closed with subcuticular stitch of 4-0 Monocryl blood loss was 800 cc and

## 2013-09-18 NOTE — Anesthesia Procedure Notes (Signed)

## 2013-09-18 NOTE — Transfer of Care (Signed)
Immediate Anesthesia Transfer of Care Note  Patient: Carol Logan  Procedure(s) Performed: Procedure(s): CESAREAN SECTION (N/A)  Patient Location: PACU  Anesthesia Type:Spinal  Level of Consciousness: awake, alert , oriented and patient cooperative  Airway & Oxygen Therapy: Patient Spontanous Breathing  Post-op Assessment: Report given to PACU RN and Post -op Vital signs reviewed and stable  Post vital signs: Reviewed and stable  Complications: No apparent anesthesia complications

## 2013-09-19 ENCOUNTER — Encounter (HOSPITAL_COMMUNITY): Payer: Self-pay | Admitting: Obstetrics

## 2013-09-19 LAB — CBC
HCT: 23.7 % — ABNORMAL LOW (ref 36.0–46.0)
HEMOGLOBIN: 7.8 g/dL — AB (ref 12.0–15.0)
MCH: 26.4 pg (ref 26.0–34.0)
MCHC: 32.9 g/dL (ref 30.0–36.0)
MCV: 80.1 fL (ref 78.0–100.0)
Platelets: 137 10*3/uL — ABNORMAL LOW (ref 150–400)
RBC: 2.96 MIL/uL — ABNORMAL LOW (ref 3.87–5.11)
RDW: 14.6 % (ref 11.5–15.5)
WBC: 13.7 10*3/uL — ABNORMAL HIGH (ref 4.0–10.5)

## 2013-09-19 LAB — BIRTH TISSUE RECOVERY COLLECTION (PLACENTA DONATION)

## 2013-09-19 MED ORDER — FAMOTIDINE 20 MG PO TABS
20.0000 mg | ORAL_TABLET | Freq: Two times a day (BID) | ORAL | Status: DC
  Administered 2013-09-19 – 2013-09-21 (×4): 20 mg via ORAL
  Filled 2013-09-19 (×6): qty 1

## 2013-09-19 NOTE — Progress Notes (Signed)
Patient ID: Carol Logan, female   DOB: [birth year], 38 y.o.   MRN: [id number] Postop day 1 Vital signs normal Incision clean and dry Lochia moderate Legs negative doing well and

## 2013-09-20 ENCOUNTER — Encounter (HOSPITAL_COMMUNITY): Payer: Self-pay | Admitting: *Deleted

## 2013-09-20 NOTE — Progress Notes (Signed)
Patient ID: Carol Logan, female   DOB: 25-Jan-1976, 38 y.o.   MRN: [id number] Postop day 2 Vital signs normal Fundus firm Lochia moderate Legs negative Doing well

## 2013-09-21 NOTE — Discharge Summary (Signed)
Obstetric Discharge Summary Reason for Admission: cesarean section Prenatal Procedures: none Intrapartum Procedures: cesarean: low cervical, transverse Postpartum Procedures: none Complications-Operative and Postpartum: none Hemoglobin  Date Value Ref Range Status  09/19/2013 7.8* 12.0 - 15.0 g/dL Final     DELTA CHECK NOTED     REPEATED TO VERIFY     HCT  Date Value Ref Range Status  09/19/2013 23.7* 36.0 - 46.0 % Final    Physical Exam:  General: alert Lochia: appropriate Uterine Fundus: firm Incision: healing well DVT Evaluation: No evidence of DVT seen on physical exam.  Discharge Diagnoses: Term Pregnancy-delivered  Discharge Information: Date: 09/21/2013 Activity: pelvic rest Diet: routine Medications: Percocet Condition: stable Instructions: refer to practice specific booklet Discharge to: home Follow-up Information   Follow up with Carol Hamman, MD.   Specialty:  Obstetrics and Gynecology   Contact information:   [address] [place] [address] [phone]       Newborn Data: Live born female  Birth Weight: 7 lb 1 oz (3204 g) APGAR: 8, 9  Home with mother.  Carol Logan A 09/21/2013, 6:49 AM

## 2013-09-21 NOTE — Discharge Instructions (Signed)
Discharge instructions   You can wash your hair  Shower  Eat what you want  Drink what you want  See me in 6 weeks  Your ankles are going to swell more in the next 2 weeks than when pregnant  No sex for 6 weeks   Kafi Dotter A, MD 09/21/2013

## 2014-03-26 IMAGING — US US OB FOLLOW-UP
1 series · 12 of 28 positions shown · non-contrast
Comparison: none

[Series 1: us ob follow-up · 42 acquisitions, 12 frames shown]
[im 2/42]
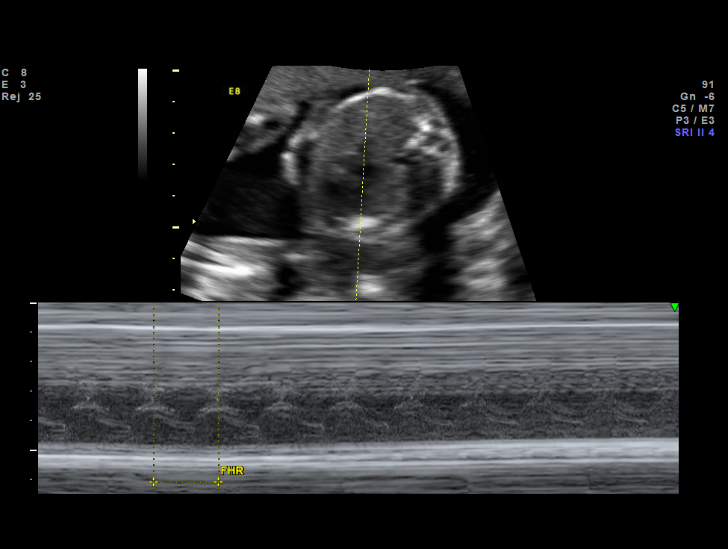
[im 5/42]
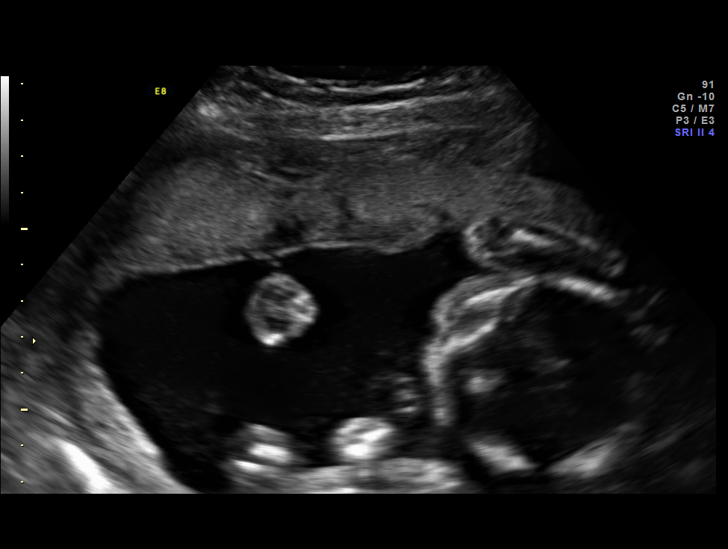
[im 8/42]
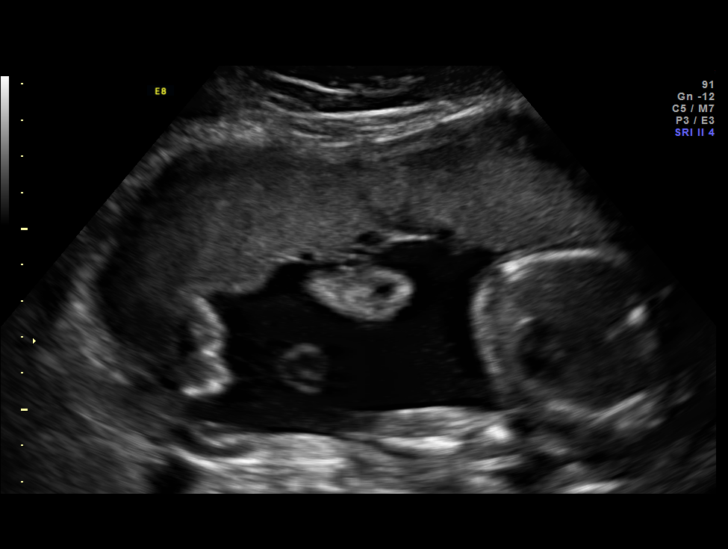
[im 13/42]
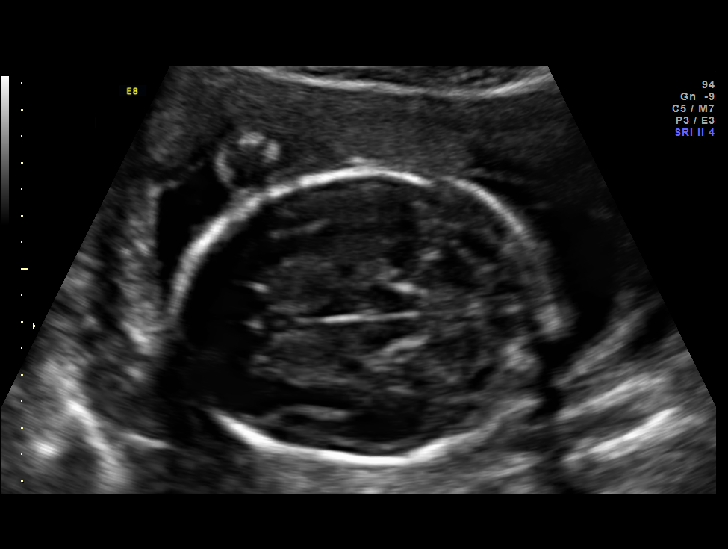
[im 16/42]
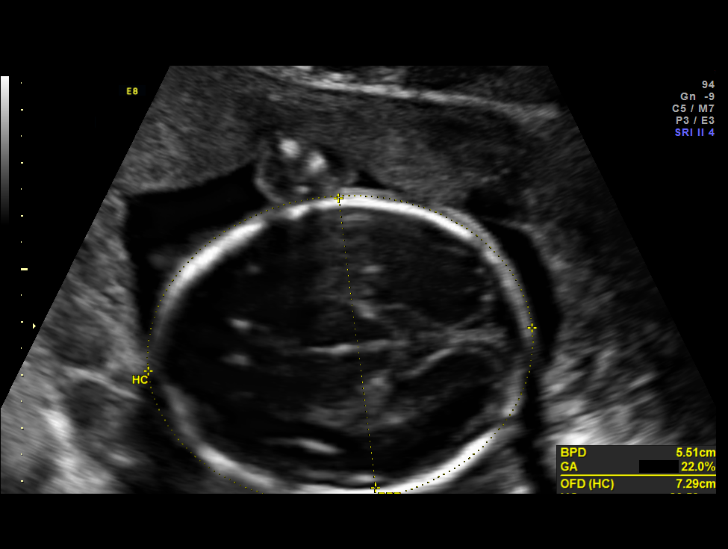
[im 19/42]
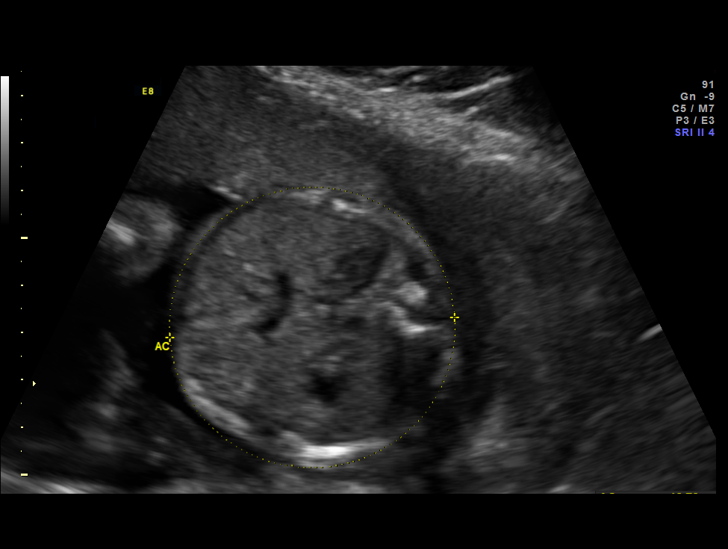
[im 23/42]
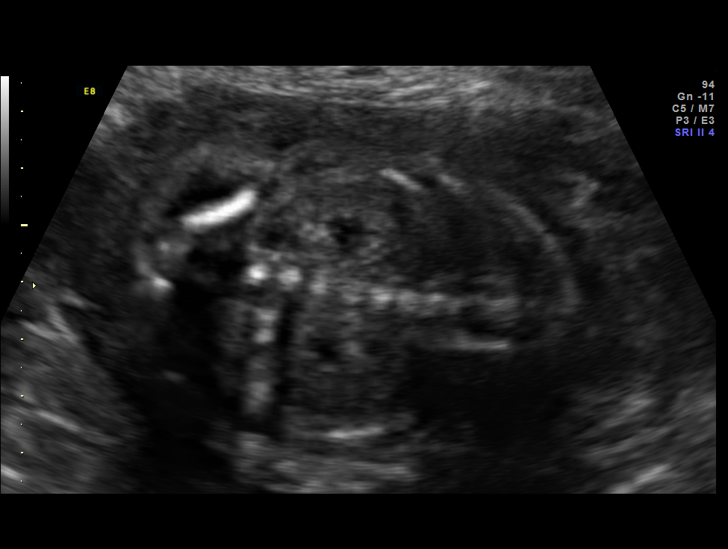
[im 26/42]
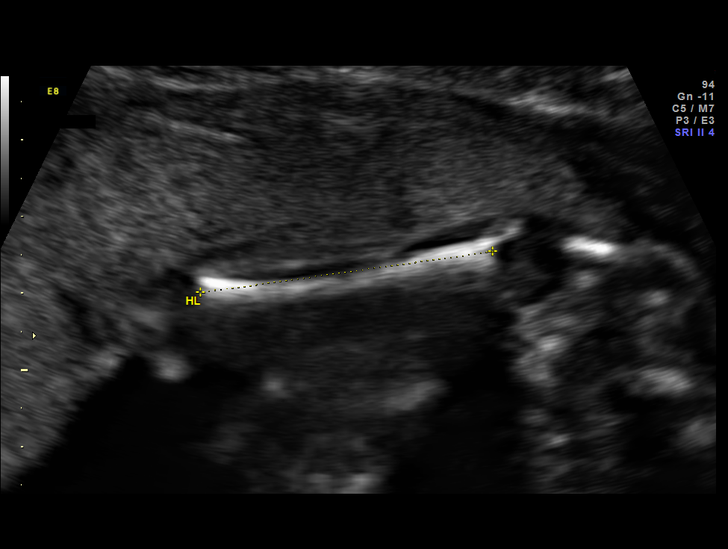
[im 29/42]
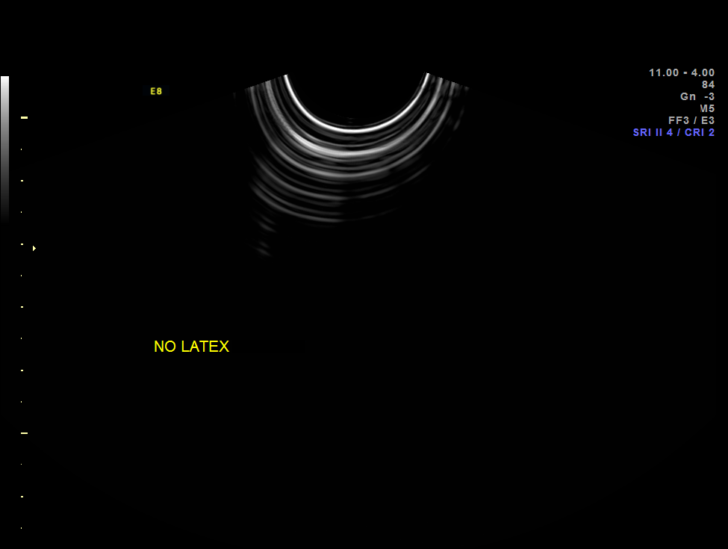
[im 34/42]
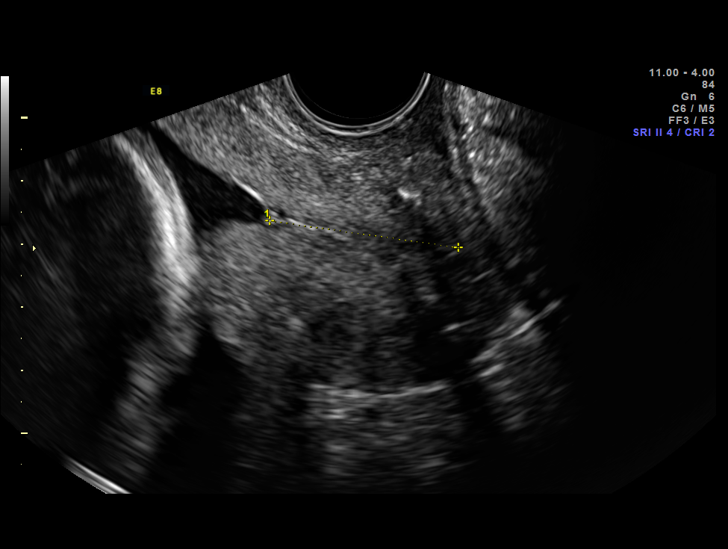
[im 37/42]
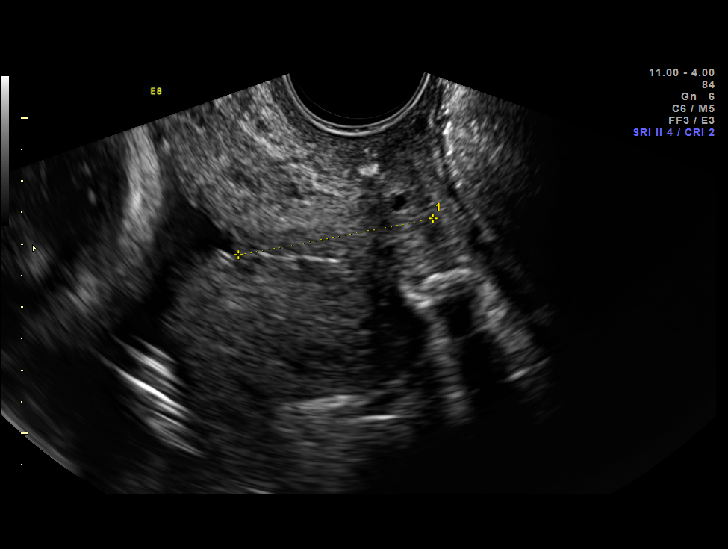
[im 40/42]
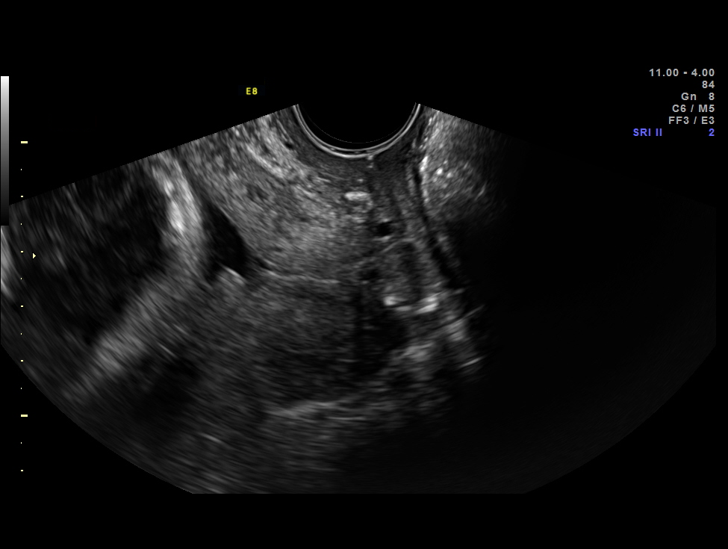

[12 of 28 positions shown; findings below may reference images not displayed]

OBSTETRICS REPORT
                      (Signed Final 06/11/2013 [DATE])

Service(s) Provided

 US OB TRANSVAGINAL                                    76817.0
 US OB FOLLOW UP                                       76816.1
Indications

 Advanced maternal age (37), Multigravida - low
 risk NIPS
 Uterine scar, other than C/S (myomectomy)
 S/P cerclage
Fetal Evaluation

 Num Of Fetuses:    1
 Fetal Heart Rate:  155                          bpm
 Cardiac Activity:  Observed
 Presentation:      Cephalic
 Placenta:          Anterior, above cervical os
 P. Cord            Previously Visualized
 Insertion:

 Amniotic Fluid
 AFI FV:      Subjectively within normal limits
                                             Larg Pckt:     6.1  cm
Biometry

 BPD:     55.7  mm     G. Age:  23w 0d                CI:         77.0   70 - 86
 OFD:     72.3  mm                                    FL/HC:      19.7   19.2 -

 HC:     205.3  mm     G. Age:  22w 4d       10  %    HC/AC:      1.09   1.05 -

 AC:     188.7  mm     G. Age:  23w 5d       48  %    FL/BPD:     72.5   71 - 87
 FL:      40.4  mm     G. Age:  23w 0d       27  %    FL/AC:      21.4   20 - 24
 HUM:     38.6  mm     G. Age:  23w 5d       47  %
 CER:     25.4  mm     G. Age:  23w 3d       49  %

 Est. FW:     581  gm      1 lb 4 oz     51  %
Gestational Age

 LMP:           22w 4d        Date:  01/04/13                 EDD:   10/11/13
 U/S Today:     23w 0d                                        EDD:   10/08/13
 Best:          23w 3d     Det. By:  Early Ultrasound         EDD:   10/05/13
                                     (03/07/13)
Anatomy

 Cranium:          Appears normal         Aortic Arch:      Previously seen
 Fetal Cavum:      Appears normal         Ductal Arch:      Previously seen
 Ventricles:       Appears normal         Diaphragm:        Previously seen
 Choroid Plexus:   Previously seen        Stomach:          Appears normal, left
                                                            sided
 Cerebellum:       Appears normal         Abdomen:          Appears normal
 Posterior Fossa:  Appears normal         Abdominal Wall:   Previously seen
 Nuchal Fold:      Previously seen        Cord Vessels:     Previously seen
 Face:             Orbits and profile     Kidneys:          Appear normal
                   previously seen
 Lips:             Previously seen        Bladder:          Appears normal
 Heart:            Appears normal         Spine:            Previously seen
                   (4CH, axis, and
                   situs)
 RVOT:             Previously seen        Lower             Previously seen
                                          Extremities:
 LVOT:             Previously seen        Upper             Previously seen
                                          Extremities:

 Other:  Fetus appears to be a male. Heels and 5th digit previously seen.
Targeted Anatomy

 Fetal Central Nervous System
 Cisterna Magna:
Cervix Uterus Adnexa

 Cervical Length:    2.8      cm

 Cervix:       Measured transvaginally. Appears funnelled, see
               comments. Cerclage visualized.

 Left Ovary:    Within normal limits.
 Right Ovary:   Within normal limits.
 Adnexa:     No abnormality visualized.
Impression

 IUP at 23+3 weeks
 Normal interval anatomy; anatomic survey complete
 Normal amniotic fluid volume
 Appropriate interval growth with EFW at the 51st %tile
 EV views of cervix: dynamic internal os with distal closed
 portion measuring 2.8 - 4.0 cms; suture visualized
Recommendations

 Follow-up ultrasound for cervical length in 2 weeks
 Follow-up ultrasound for cervical length and growth in 4
 weeks

## 2014-04-23 IMAGING — US US OB TRANSVAGINAL
1 series · 12 of 28 positions shown · non-contrast
Comparison: none

[Series 1: us ob transvaginal · 12 of 46 slices shown]
[im 2/46]
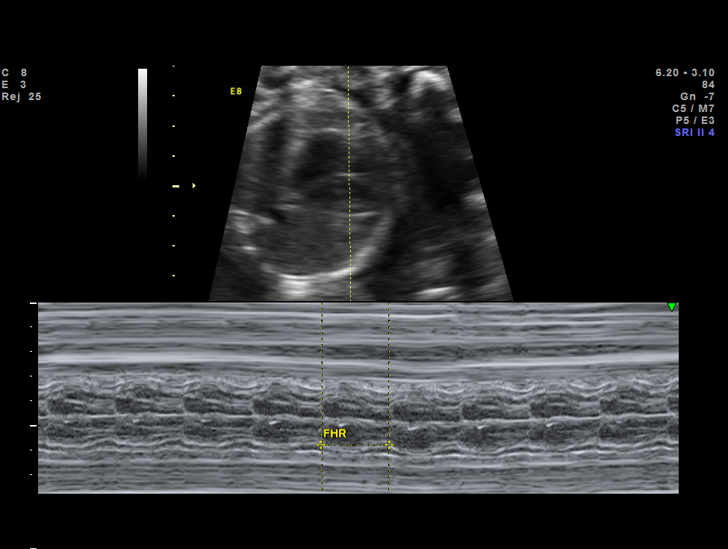
[im 6/46]
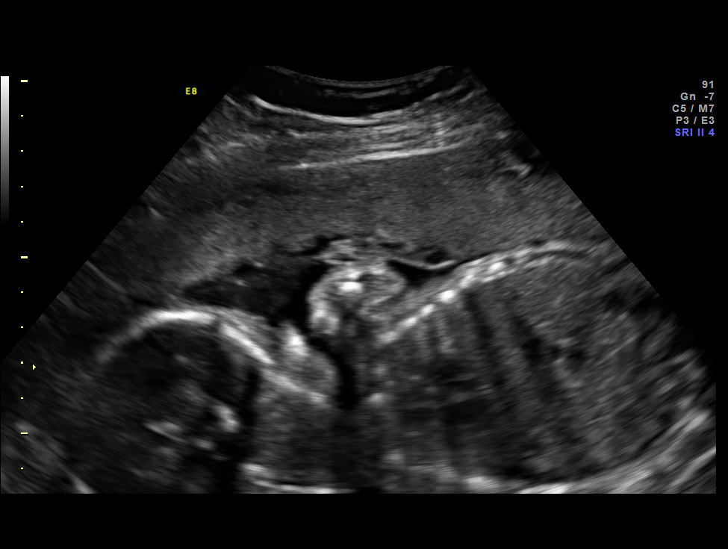
[im 9/46]
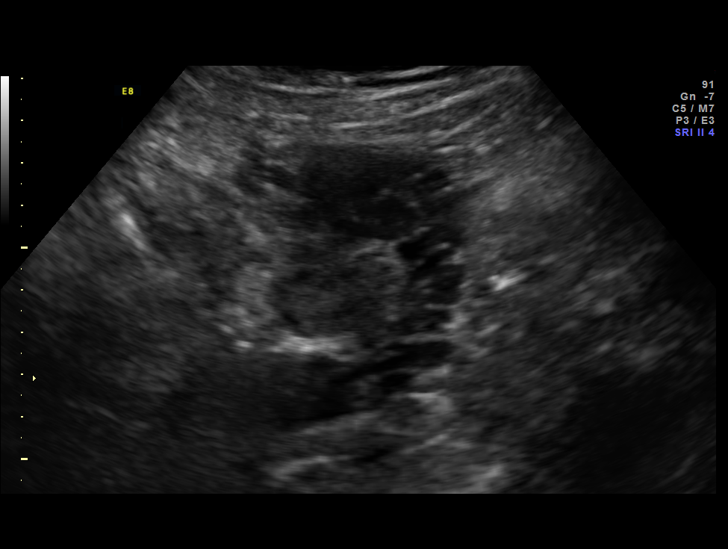
[im 14/46]
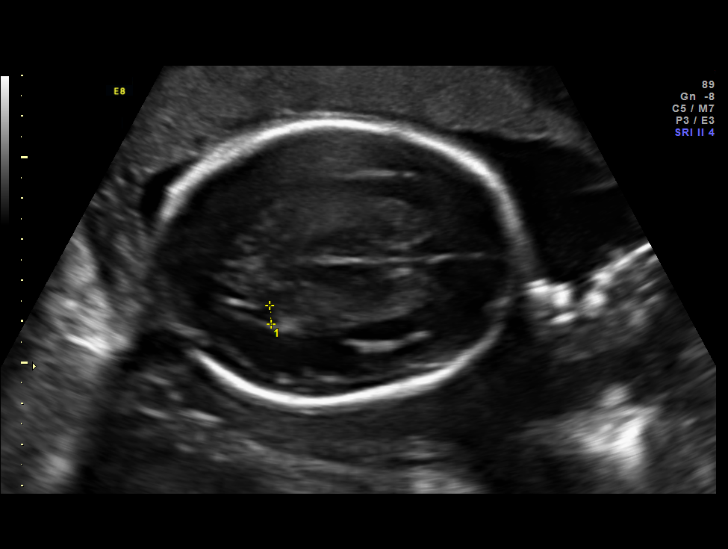
[im 17/46]
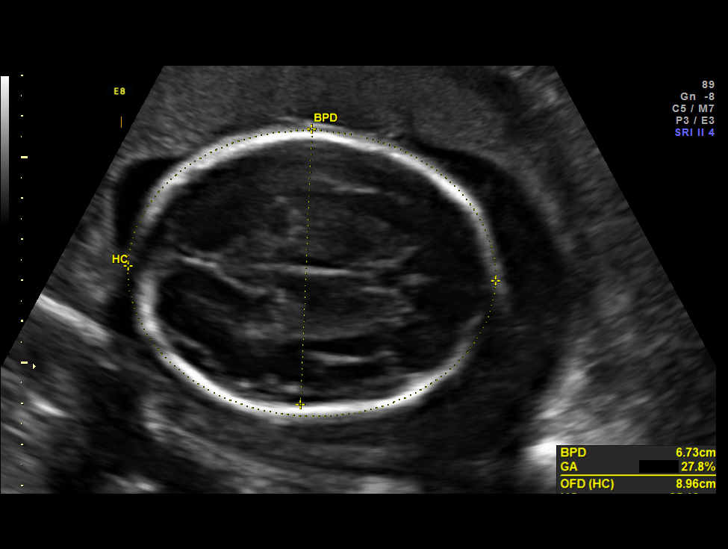
[im 21/46]
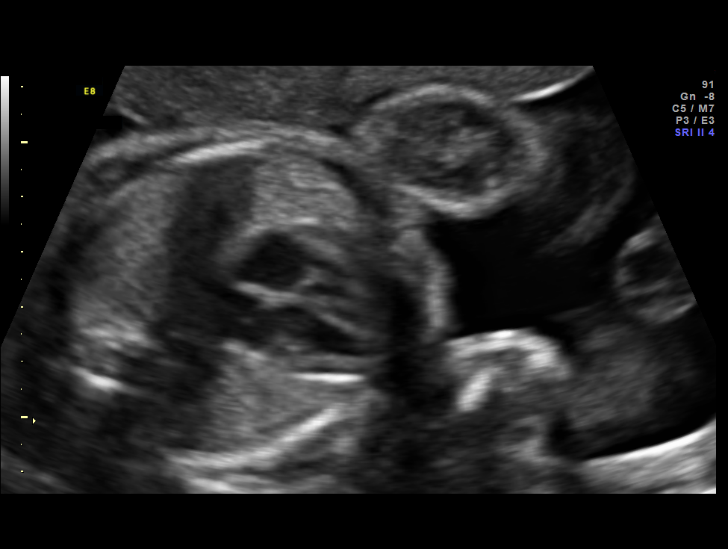
[im 26/46]
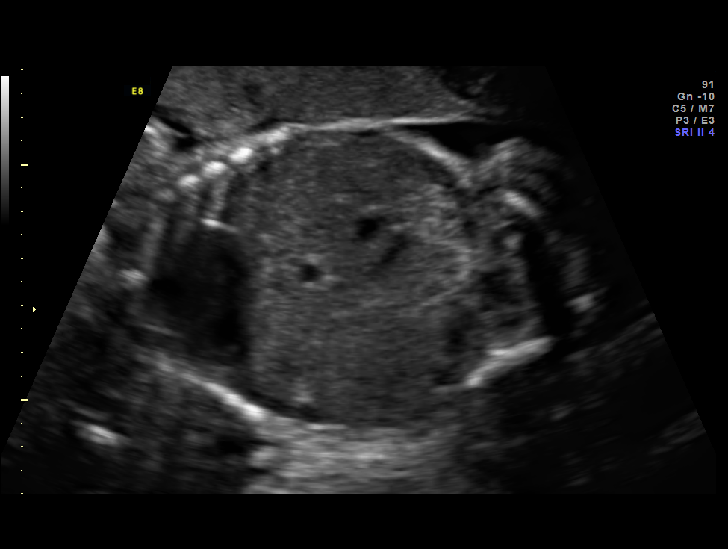
[im 29/46]
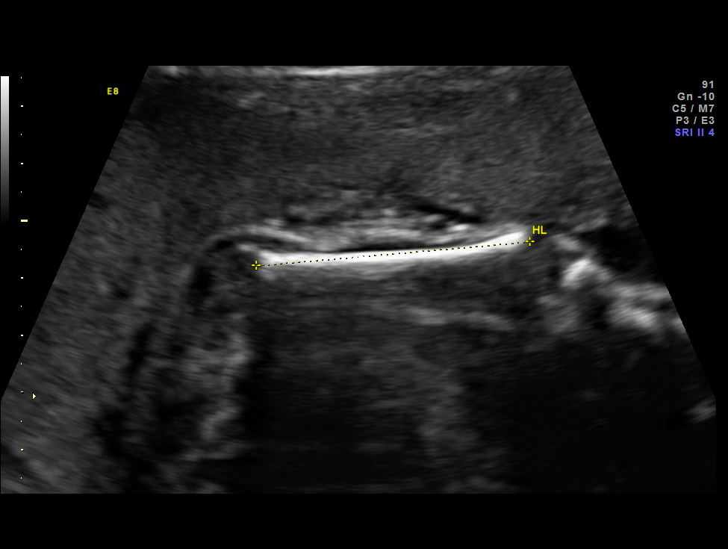
[im 32/46]
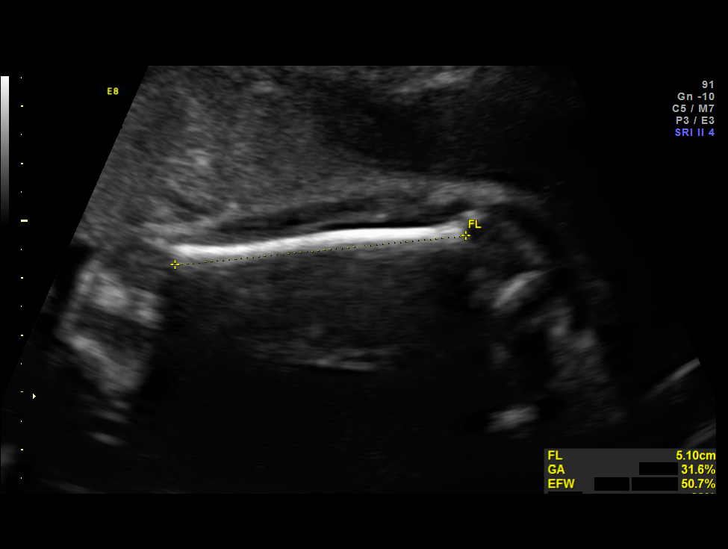
[im 37/46]
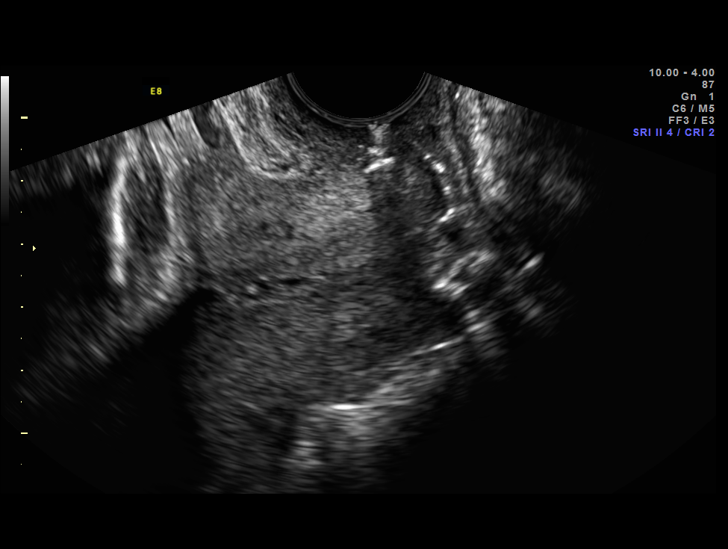
[im 41/46]
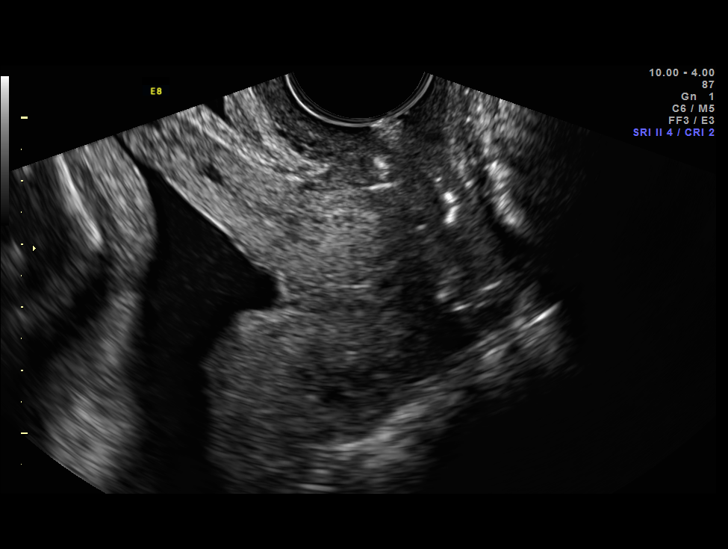
[im 44/46]
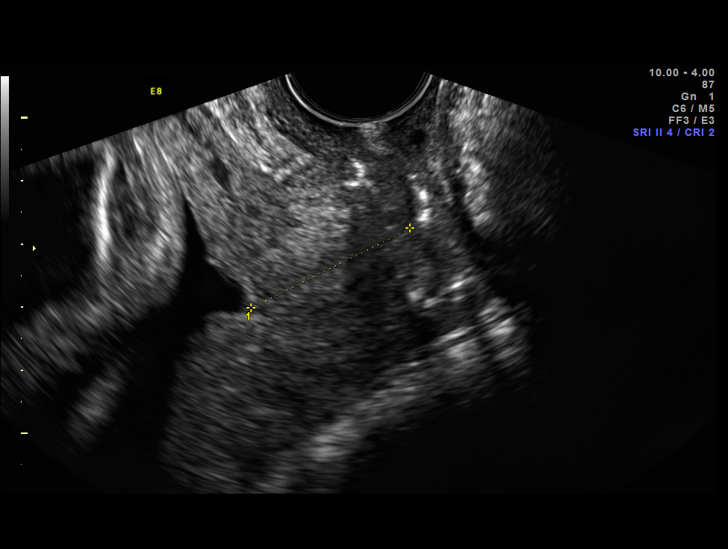

[12 of 28 positions shown; findings below may reference images not displayed]

OBSTETRICS REPORT
                      (Signed Final 07/09/2013 [DATE])

Service(s) Provided

 US OB TRANSVAGINAL                                    76817.0
 US OB FOLLOW UP                                       76816.1
Indications

 Advanced maternal age (37), Multigravida - low
 risk NIPS
 Uterine scar, other than C/S (myomectomy)
 S/P cerclage
Fetal Evaluation

 Num Of Fetuses:    1
 Fetal Heart Rate:  148                          bpm
 Cardiac Activity:  Observed
 Presentation:      Breech
 Placenta:          Anterior, above cervical os
 P. Cord            Previously Visualized
 Insertion:

 Amniotic Fluid
 AFI FV:      Subjectively within normal limits
                                             Larg Pckt:     4.9  cm
Biometry

 BPD:     67.5  mm     G. Age:  27w 1d                CI:         75.3   70 - 86
 OFD:     89.6  mm                                    FL/HC:      20.5   18.6 -

 HC:     250.7  mm     G. Age:  27w 2d       18  %    HC/AC:      1.09   1.05 -

 AC:       231  mm     G. Age:  27w 4d       43  %    FL/BPD:     76.0   71 - 87
 FL:      51.3  mm     G. Age:  27w 3d       36  %    FL/AC:      22.2   20 - 24
 HUM:     47.2  mm     G. Age:  27w 5d       54  %
 CER:     32.9  mm     G. Age:  28w 6d       69  %

 Est. FW:    1861  gm      2 lb 6 oz     52  %
Gestational Age

 LMP:           26w 4d        Date:  01/04/13                 EDD:   10/11/13
 U/S Today:     27w 3d                                        EDD:   10/05/13
 Best:          27w 3d     Det. By:  Early Ultrasound         EDD:   10/05/13
                                     (03/07/13)
Anatomy

 Cranium:          Appears normal         Aortic Arch:      Previously seen
 Fetal Cavum:      Appears normal         Ductal Arch:      Previously seen
 Ventricles:       Appears normal         Diaphragm:        Previously seen
 Choroid Plexus:   Previously seen        Stomach:          Appears normal, left
                                                            sided
 Cerebellum:       Appears normal         Abdomen:          Appears normal
 Posterior Fossa:  Appears normal         Abdominal Wall:   Previously seen
 Nuchal Fold:      Previously seen        Cord Vessels:     Previously seen
 Face:             Orbits and profile     Kidneys:          Appear normal
                   previously seen
 Lips:             Previously seen        Bladder:          Appears normal
 Heart:            Appears normal         Spine:            Previously seen
                   (4CH, axis, and
                   situs)
 RVOT:             Previously seen        Lower             Previously seen
                                          Extremities:
 LVOT:             Previously seen        Upper             Previously seen
                                          Extremities:

 Other:  Fetus appears to be a male. Heels and 5th digit previously seen.
Targeted Anatomy

 Fetal Central Nervous System
 Cisterna Magna:
Cervix Uterus Adnexa

 Cervical Length:    2.7      cm

 Cervix:       Measured transvaginally. Cerclage visualized.
               Appears funnelled, see comments.

 Left Ovary:    Within normal limits.
 Right Ovary:   Within normal limits.
 Adnexa:     No abnormality visualized.
Myomas

 Site                     L(cm)      W(cm)       D(cm)      Location
 Anterior left

 Blood Flow                  RI       PI       Comments

Impression

 IUP at 27+3 weeks
 Normal interval anatomy; anatomic survey complete
 Normal amniotic fluid volume
 Appropriate interval growth with EFW at the 52nd %tile
 EV views of cervix: mild, dynamic funneling of internal os;
 measuring 2.7 - 4.0 cms
 Fibroid uterus: see above for size and location
Recommendations

 Follow-up ultrasound for growth in 4 weeks
 Deliver by C/S between 36-37 weeks due to prior
 myomectomy

## 2014-05-25 ENCOUNTER — Encounter (HOSPITAL_COMMUNITY): Payer: Self-pay | Admitting: *Deleted

## 2014-06-17 ENCOUNTER — Encounter: Payer: Self-pay | Admitting: Family Medicine

## 2014-06-17 ENCOUNTER — Ambulatory Visit (INDEPENDENT_AMBULATORY_CARE_PROVIDER_SITE_OTHER): Payer: Medicaid Other | Admitting: Family Medicine

## 2014-06-17 VITALS — BP 127/86 | HR 73 | Ht 65.0 in | Wt 185.0 lb

## 2014-06-17 DIAGNOSIS — J302 Other seasonal allergic rhinitis: Secondary | ICD-10-CM

## 2014-06-17 DIAGNOSIS — Z0000 Encounter for general adult medical examination without abnormal findings: Secondary | ICD-10-CM | POA: Diagnosis not present

## 2014-06-17 DIAGNOSIS — Z30013 Encounter for initial prescription of injectable contraceptive: Secondary | ICD-10-CM

## 2014-06-17 DIAGNOSIS — D259 Leiomyoma of uterus, unspecified: Secondary | ICD-10-CM | POA: Insufficient documentation

## 2014-06-17 DIAGNOSIS — E669 Obesity, unspecified: Secondary | ICD-10-CM | POA: Diagnosis present

## 2014-06-17 DIAGNOSIS — J309 Allergic rhinitis, unspecified: Secondary | ICD-10-CM | POA: Insufficient documentation

## 2014-06-17 LAB — CBC WITH DIFFERENTIAL/PLATELET
BASOS ABS: 0 10*3/uL (ref 0.0–0.1)
BASOS PCT: 0 % (ref 0–1)
EOS PCT: 1 % (ref 0–5)
Eosinophils Absolute: 0.1 10*3/uL (ref 0.0–0.7)
HCT: 36.5 % (ref 36.0–46.0)
Hemoglobin: 12.2 g/dL (ref 12.0–15.0)
Lymphocytes Relative: 42 % (ref 12–46)
Lymphs Abs: 2.9 10*3/uL (ref 0.7–4.0)
MCH: 26.3 pg (ref 26.0–34.0)
MCHC: 33.4 g/dL (ref 30.0–36.0)
MCV: 78.8 fL (ref 78.0–100.0)
MPV: 11.5 fL (ref 9.4–12.4)
Monocytes Absolute: 0.6 10*3/uL (ref 0.1–1.0)
Monocytes Relative: 8 % (ref 3–12)
NEUTROS ABS: 3.4 10*3/uL (ref 1.7–7.7)
Neutrophils Relative %: 49 % (ref 43–77)
Platelets: 265 10*3/uL (ref 150–400)
RBC: 4.63 MIL/uL (ref 3.87–5.11)
RDW: 15.7 % — AB (ref 11.5–15.5)
WBC: 6.9 10*3/uL (ref 4.0–10.5)

## 2014-06-17 LAB — COMPLETE METABOLIC PANEL WITH GFR
ALK PHOS: 65 U/L (ref 39–117)
ALT: 13 U/L (ref 0–35)
AST: 17 U/L (ref 0–37)
Albumin: 4.1 g/dL (ref 3.5–5.2)
BILIRUBIN TOTAL: 0.2 mg/dL (ref 0.2–1.2)
BUN: 14 mg/dL (ref 6–23)
CO2: 24 mEq/L (ref 19–32)
Calcium: 9.2 mg/dL (ref 8.4–10.5)
Chloride: 104 mEq/L (ref 96–112)
Creat: 0.84 mg/dL (ref 0.50–1.10)
GFR, Est African American: >89 mL/min
GFR, Est Non African American: 88 mL/min
Glucose, Bld: 77 mg/dL (ref 70–99)
Potassium: 5.1 mEq/L (ref 3.5–5.3)
SODIUM: 138 meq/L (ref 135–145)
Total Protein: 7.8 g/dL (ref 6.0–8.3)

## 2014-06-17 LAB — LIPID PANEL
CHOL/HDL RATIO: 3.8 ratio
CHOLESTEROL: 241 mg/dL — AB (ref 0–200)
HDL: 63 mg/dL (ref >39)
LDL Cholesterol: 156 mg/dL — ABNORMAL HIGH (ref 0–99)
TRIGLYCERIDES: 111 mg/dL (ref <150)
VLDL: 22 mg/dL (ref 0–40)

## 2014-06-17 LAB — POCT URINE PREGNANCY: Preg Test, Ur: NEGATIVE

## 2014-06-17 MED ORDER — FLUTICASONE PROPIONATE 50 MCG/ACT NA SUSP: 2.0000 | Freq: Every day | NASAL | Status: DC

## 2014-06-17 MED ORDER — MEDROXYPROGESTERONE ACETATE 150 MG/ML IM SUSP
150.0000 mg | Freq: Once | INTRAMUSCULAR | Status: AC
  Administered 2014-06-17: 150 mg via INTRAMUSCULAR

## 2014-06-17 NOTE — Assessment & Plan Note (Signed)
She has history of fibroid uterus, myomectomy and tubal ligation. Patient states that after her last pregnancy, she was told that her fibroids have returned. No palpable fibroids on exam today. She does have history of very heavy menses. She states on the Depo-Provera shot she had decreased menses. Is currently not sexually active, or complaints of abdominal pain from her fibroids. Term pregnancy today negative>> Depo-Provera given Patient to make an appointment after the holidays to have Pap smear completed, and discussion of fibroids and potential treatment options

## 2014-06-17 NOTE — Addendum Note (Signed)
Addended by: Howard Pouch A on: 06/17/2014 05:23 PM   Modules accepted: Orders

## 2014-06-17 NOTE — Assessment & Plan Note (Signed)
Overall exam today. Up-to-date with immunizations, declined flu shot today Yearly labs ordered Follow-up in January, for well woman exam and discussion of her fibroids and treatment options

## 2014-06-17 NOTE — Progress Notes (Signed)
Subjective:    Patient ID: Carol Logan, female    DOB: 14-Jan-1976, 38 y.o.   MRN: [id number]  HPI  Patient presents to family medicine clinic today for establishment of care  History of fibroids: Patient states she has had a history of fibroids, with myomectomy in 2013. She was pregnant with her last child in 2015. She had a tubal ligation in 2015 as well. She states she was told that her fibroids "have returned ".  She complains today of heavy menses. Her menstrual cycles are regular, lasting approximately 4-7 days and heavy in nature. She denies any pain currently associated with her fibroids. She is not sexually active.   She is due for her flu shot today, but declines.  There is no history of colon cancer or breast cancer in the family.  Past Medical History  Diagnosis Date  . Fibroids   . Ovarian cyst   . No pertinent past medical history   . Headache(784.0)   . GERD (gastroesophageal reflux disease)    No Known Allergies No current outpatient prescriptions on file prior to visit.   No current facility-administered medications on file prior to visit.   Family History  Problem Relation Age of Onset  . High blood pressure Mother   . Glaucoma Paternal Uncle   . Alzheimer's disease Paternal Uncle   . Cancer Neg Hx   . Depression Neg Hx   . Diabetes Neg Hx   . Drug abuse Neg Hx   . Early death Neg Hx   . Alcohol abuse Neg Hx   . Heart disease Neg Hx   . Hyperlipidemia Neg Hx   . Hypertension Neg Hx   . Kidney disease Neg Hx   . Stroke Neg Hx    History   Social History  . Marital Status: Married    Spouse Name: N/A    Number of Children: 1  . Years of Education: college   Occupational History  . 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 Smoking status: Never Smoker   . Smokeless tobacco: Never Used  . Alcohol Use: No     Comment: socially- 1 glass of wine monthly  . Drug Use: No  . Sexual Activity: Not Currently   Other  Topics Concern  . Not on file   Social History Narrative   Pt lives at home with her spouse and son.   Caffeine Use- Consumes coffee/tea twice a week    Review of Systems Per history of present illness    Objective:   Physical Exam BP 127/86 mmHg  Pulse 73  Ht 5\' 5"  (1.651 m)  Wt 185 lb (83.915 kg)  BMI 30.79 kg/m2  LMP 06/09/2014 Gen: Pleasant, African-American female, no acute distress, nontoxic appearance, well-developed, well-nourished, mildly obese HEENT: AT. [place]. Bilateral TM visualized and normal in appearance. Bilateral eyes without injections or icterus. MMM. Bilateral nares with swelling, mild erythema. Throat without erythema or exudates.  Neck: Supple, normal range of motion, no thyromegaly, no lymphadenopathy CV: RRR no murmur appreciated Chest: CTAB, no wheeze or crackles Abd: Soft. Round. NTND. BS present. No Masses palpated.  Ext: No erythema. No  edema. 2/4 PT Skin: No rashes, purpura or petechiae.  Neuro:  Normal gait. PERLA. EOMi. Alert. Cranial nerves II through XII intact, deep tendon reflexes equal bilaterally MSK: 5/5 upper and lower extremity Psych: Normal affect, dress, demeanor and mood. Normal speech  Assessment & Plan:

## 2014-06-17 NOTE — Assessment & Plan Note (Signed)
Flonase prescribed today. Patient encouraged to take over-the-counter Allegra Follow-up as needed

## 2014-06-17 NOTE — Patient Instructions (Signed)
It was a pleasure meeting you today, please make an appointment sometime in January to have your Pap smear and discussion her fibroids. We will do urine pregnancy today, and a Depo-Provera shot to help with her bleeding pattern.

## 2014-06-22 ENCOUNTER — Telehealth: Payer: Self-pay | Admitting: Family Medicine

## 2014-06-22 ENCOUNTER — Encounter: Payer: Self-pay | Admitting: Family Medicine

## 2014-06-22 DIAGNOSIS — E785 Hyperlipidemia, unspecified: Secondary | ICD-10-CM | POA: Insufficient documentation

## 2014-06-22 MED ORDER — ATORVASTATIN CALCIUM 20 MG PO TABS: 20.0000 mg | ORAL_TABLET | Freq: Every day | ORAL | Status: DC

## 2014-06-22 NOTE — Telephone Encounter (Signed)
Please call Ms. Carol Logan, her lab work looked good with the exception of her cholesterol. It is elevated, both total and LDL, I have called in a statin (cholesterol lowering mediation) for her to start daily. She should also be encouraged to eat more fresh fruits/vegetables and decrease any types of fried or fatty meats. I have sent her results to her via mail. Thanks.

## 2014-06-22 NOTE — Telephone Encounter (Signed)
LVM for patient to call back. ?

## 2014-06-23 NOTE — Telephone Encounter (Signed)
LVM for patient to call back to inform her of below

## 2014-06-24 ENCOUNTER — Telehealth: Payer: Self-pay | Admitting: Family Medicine

## 2014-06-24 NOTE — Telephone Encounter (Signed)
Attempted to call patient again. LVM for patient to call back, if patient does call back please give her below message

## 2014-06-24 NOTE — Telephone Encounter (Signed)
Message delivered

## 2014-07-26 ENCOUNTER — Emergency Department (HOSPITAL_COMMUNITY)
Admission: EM | Admit: 2014-07-26 | Discharge: 2014-07-26 | Disposition: A | Discharge status: Home / Self Care | Payer: Medicaid Other | Source: Home / Self Care | Attending: Family Medicine | Admitting: Family Medicine

## 2014-07-26 ENCOUNTER — Encounter (HOSPITAL_COMMUNITY): Payer: Self-pay | Admitting: Emergency Medicine

## 2014-07-26 DIAGNOSIS — J40 Bronchitis, not specified as acute or chronic: Secondary | ICD-10-CM

## 2014-07-26 MED ORDER — PREDNISONE 10 MG PO TABS: 30.0000 mg | ORAL_TABLET | Freq: Every day | ORAL | Status: DC

## 2014-07-26 MED ORDER — TRAMADOL HCL 50 MG PO TABS: 50.0000 mg | ORAL_TABLET | Freq: Every evening | ORAL | Status: DC | PRN

## 2014-07-26 MED ORDER — AZITHROMYCIN 250 MG PO TABS: 250.0000 mg | ORAL_TABLET | Freq: Every day | ORAL | Status: DC

## 2014-07-26 MED ORDER — IPRATROPIUM BROMIDE 0.06 % NA SOLN: 2.0000 | Freq: Four times a day (QID) | NASAL | Status: DC

## 2014-07-26 NOTE — ED Notes (Signed)
Patient c/o chest congestion, nasal drainage and fever x 3 weeks. Patient reports the first week she did have fever.  Patient has taken Mucinex and [employer], advertising. She also c/o nausea and one episode of diarrhea. Patient is in NAD.

## 2014-07-26 NOTE — ED Provider Notes (Signed)
Carol Logan is a 39 y.o. female who presents to Urgent Care today for cough fatigue chills. Symptoms present for 3 weeks. Cough is nonproductive. No vomiting abdominal pain or chest pain. Patient has a little shortness of breath but no wheezing. She's tried some over-the-counter medications which have helped a bit.   Past Medical History  Diagnosis Date  . Fibroids   . Ovarian cyst   . No pertinent past medical history   . Headache(784.0)   . GERD (gastroesophageal reflux disease)    Past Surgical History  Procedure Laterality Date  . Foot surgery    . Myomectomy  10/11/2011    Procedure: MYOMECTOMY;  Surgeon: [name], MD;  Location: [facility];  Service: Gynecology;  Laterality: N/A;  . Cervical cerclage N/A 04/01/2013    Procedure: CERCLAGE CERVICAL;  Surgeon: [name], MD;  Location: [facility];  Service: Gynecology;  Laterality: N/A;  . Cesarean section N/A 09/18/2013    Procedure: CESAREAN SECTION;  Surgeon: [name], MD;  Location: [facility];  Service: Obstetrics;  Laterality: N/A;  . Fracture surgery     History  Substance Use Topics  . Smoking status: Never Smoker   . Smokeless tobacco: Never Used  . Alcohol Use: No     Comment: socially- 1 glass of wine monthly   ROS as above Medications: No current facility-administered medications for this encounter.   Current Outpatient Prescriptions  Medication Sig Dispense Refill  . atorvastatin (LIPITOR) 20 MG tablet Take 1 tablet (20 mg total) by mouth daily. 90 tablet 3  . azithromycin (ZITHROMAX) 250 MG tablet Take 1 tablet (250 mg total) by mouth daily. Take first 2 tablets together, then 1 every day until finished. 6 tablet 0  . fluticasone (FLONASE) 50 MCG/ACT nasal spray Place 2 sprays into both nostrils daily. 16 g 6  . ipratropium (ATROVENT) 0.06 % nasal spray Place 2 sprays into both nostrils 4 (four) times daily. 15 mL 1  . predniSONE (DELTASONE) 10 MG tablet Take 3 tablets (30 mg total) by  mouth daily. 15 tablet 0  . traMADol (ULTRAM) 50 MG tablet Take 1 tablet (50 mg total) by mouth at bedtime as needed (cough). 10 tablet 0   No Known Allergies   Exam:  BP 138/83 mmHg  Pulse 63  Temp(Src) 98.9 F (37.2 C) (Oral)  Resp 12  SpO2 99%  LMP 06/09/2014 Gen: Well NAD HEENT: EOMI,  MMM posterior pharynx cobblestoning. Normal tympanic membranes bilaterally. Lungs: Normal work of breathing. Coarse breath sounds throughout Heart: RRR no MRG Abd: NABS, Soft. Nondistended, Nontender Exts: Brisk capillary refill, warm and well perfused.   No results found for this or any previous visit (from the past 24 hour(s)). No results found.  Assessment and Plan: 39 y.o. female with bronchitis. Treatment with prednisone and Atrovent nasal spray and tramadol for cough. Azithromycin for use if not improved.  Discussed warning signs or symptoms. Please see discharge instructions. Patient expresses understanding.     Gregor Hams, MD 07/26/14 [phone]

## 2014-07-26 NOTE — Discharge Instructions (Signed)
Thank you for coming in today. Call or go to the emergency room if you get worse, have trouble breathing, have chest pains, or palpitations.   Do not drive after taking tramadol.   Acute Bronchitis Bronchitis is inflammation of the airways that extend from the windpipe into the lungs (bronchi). The inflammation often causes mucus to develop. This leads to a cough, which is the most common symptom of bronchitis.  In acute bronchitis, the condition usually develops suddenly and goes away over time, usually in a couple weeks. Smoking, allergies, and asthma can make bronchitis worse. Repeated episodes of bronchitis may cause further lung problems.  CAUSES Acute bronchitis is most often caused by the same virus that causes a cold. The virus can spread from person to person (contagious) through coughing, sneezing, and touching contaminated objects. SIGNS AND SYMPTOMS   Cough.   Fever.   Coughing up mucus.   Body aches.   Chest congestion.   Chills.   Shortness of breath.   Sore throat.  DIAGNOSIS  Acute bronchitis is usually diagnosed through a physical exam. Your health care provider will also ask you questions about your medical history. Tests, such as chest X-rays, are sometimes done to rule out other conditions.  TREATMENT  Acute bronchitis usually goes away in a couple weeks. Oftentimes, no medical treatment is necessary. Medicines are sometimes given for relief of fever or cough. Antibiotic medicines are usually not needed but may be prescribed in certain situations. In some cases, an inhaler may be recommended to help reduce shortness of breath and control the cough. A cool mist vaporizer may also be used to help thin bronchial secretions and make it easier to clear the chest.  HOME CARE INSTRUCTIONS  Get plenty of rest.   Drink enough fluids to keep your urine clear or pale yellow (unless you have a medical condition that requires fluid restriction). Increasing fluids  may help thin your respiratory secretions (sputum) and reduce chest congestion, and it will prevent dehydration.   Take medicines only as directed by your health care provider.  If you were prescribed an antibiotic medicine, finish it all even if you start to feel better.  Avoid smoking and secondhand smoke. Exposure to cigarette smoke or irritating chemicals will make bronchitis worse. If you are a smoker, consider using nicotine gum or skin patches to help control withdrawal symptoms. Quitting smoking will help your lungs heal faster.   Reduce the chances of another bout of acute bronchitis by washing your hands frequently, avoiding people with cold symptoms, and trying not to touch your hands to your mouth, nose, or eyes.   Keep all follow-up visits as directed by your health care provider.  SEEK MEDICAL CARE IF: Your symptoms do not improve after 1 week of treatment.  SEEK IMMEDIATE MEDICAL CARE IF:  You develop an increased fever or chills.   You have chest pain.   You have severe shortness of breath.  You have bloody sputum.   You develop dehydration.  You faint or repeatedly feel like you are going to pass out.  You develop repeated vomiting.  You develop a severe headache. MAKE SURE YOU:   Understand these instructions.  Will watch your condition.  Will get help right away if you are not doing well or get worse. Document Released: 08/17/2004 Document Revised: 11/24/2013 Document Reviewed: 12/31/2012 [facility] Patient Information 2015 [place], [place]. This information is not intended to replace advice given to you by your health care provider. Make sure  you discuss any questions you have with your health care provider.

## 2014-08-10 ENCOUNTER — Telehealth: Payer: Self-pay | Admitting: Family Medicine

## 2014-08-10 NOTE — Telephone Encounter (Signed)
Pt called and would like to speak to one of the nurse on Dr. Lucita Lora team concerning her Depo on 06/17/14. [name]

## 2014-08-10 NOTE — Telephone Encounter (Signed)
Spoke with patient and informed her of below message

## 2014-08-10 NOTE — Telephone Encounter (Signed)
Patient states that since she has received depo injection on 06/17/2014 she has had a variation form heavy to spotting of her period. i did explain to her that this can happen and each person's body can act differently to this injection. patient would like to know if she can go on BC pill instead.

## 2014-08-10 NOTE — Telephone Encounter (Signed)
We can discuss birth control pills or other forms of BC for her if she would like. We would not start anything until she would be due for her next depo shot. Thanks

## 2014-08-10 NOTE — Telephone Encounter (Signed)
LVM for patient to call back. ?

## 2014-08-18 ENCOUNTER — Telehealth: Payer: Self-pay | Admitting: Family Medicine

## 2014-08-18 NOTE — Telephone Encounter (Signed)
Pt is switching from depo to the pill.  Does she need an appt with dr to do that? She is at work but leave a message when you return the call

## 2014-08-19 NOTE — Telephone Encounter (Signed)
Covering for Dr Raoul Pitch.  It appears Dr Raoul Pitch was made aware with phone note 1/18 about pt considering OCP instead of depo shot. Please let patient know that she should make an appointment with Dr Raoul Pitch so they can review PMH and risks and benefits of starting OCP.  ,Hilton Sinclair, MD

## 2014-08-21 NOTE — Telephone Encounter (Signed)
Pt called back and doesn't understand why she has to have an appointment to change [facility]. She would like to talk to one of Dr. Raoul Pitch nurses. jw

## 2014-08-25 NOTE — Telephone Encounter (Signed)
Pt called again, she doesn't understand why nurses havent called yet She is upset

## 2014-08-27 NOTE — Telephone Encounter (Signed)
Spoke with pt and informed her that in order for her to start a new BC she would need to come in for a visit and discuss this with a doctor.  Dr. Raoul Pitch was not available until after March 7, therefore we set her up with and appointment with Dr. Awanda Mink next week to discuss changing her [facility]. Katharina Caper, April D

## 2014-08-27 NOTE — Telephone Encounter (Signed)
LVM for pt to call back to discuss below. Katharina Caper, Tru Leopard D

## 2014-08-31 ENCOUNTER — Encounter: Payer: Self-pay | Admitting: Family Medicine

## 2014-08-31 ENCOUNTER — Other Ambulatory Visit: Payer: Self-pay | Admitting: Family Medicine

## 2014-08-31 ENCOUNTER — Ambulatory Visit (INDEPENDENT_AMBULATORY_CARE_PROVIDER_SITE_OTHER): Payer: Medicaid Other | Admitting: Family Medicine

## 2014-08-31 VITALS — BP 119/80 | HR 80 | Temp 98.4°F | Ht 66.0 in | Wt 189.0 lb

## 2014-08-31 DIAGNOSIS — Z3040 Encounter for surveillance of contraceptives, unspecified: Secondary | ICD-10-CM

## 2014-08-31 LAB — POCT URINE PREGNANCY: Preg Test, Ur: NEGATIVE

## 2014-08-31 MED ORDER — NORGESTIM-ETH ESTRAD TRIPHASIC 0.18/0.215/0.25 MG-25 MCG PO TABS: 1.0000 | ORAL_TABLET | Freq: Every day | ORAL | Status: DC

## 2014-08-31 NOTE — Progress Notes (Signed)
  Subjective:    Carol Logan is a 39 y.o. female who presents for contraception counseling. The patient has no complaints today. The patient is sexually active. Pertinent past medical history: none.  Menstrual History: OB History    Gravida Para Term Preterm AB TAB SAB Ectopic Multiple Living   4 2 2  0 2 2 0 0 0 2      Patient's last menstrual period was 05/24/2014.    The following portions of the patient's history were reviewed and updated as appropriate: allergies, current medications, past family history, past medical history, past social history, past surgical history and problem list.  Review of Systems Pertinent items are noted in HPI.   Objective:    BP 119/80 mmHg  Pulse 80  Temp(Src) 98.4 F (36.9 C) (Oral)  Ht 5\' 6"  (1.676 m)  Wt 189 lb (85.73 kg)  BMI 30.52 kg/m2  LMP 05/24/2014 Head: Normocephalic, without obvious abnormality, atraumatic Heart: regular rate and rhythm, S1, S2 normal, no murmur, click, rub or gallop Abdomen: soft, non-tender; bowel sounds normal; no masses,  no organomegaly   Assessment:    39 y.o., switching to OCP from Depo.  Too many HA.  Denies smoking, hx of VTE, fhx of CA. - U preg today  - Start ortho tri cycline.  F/U in 3 months.

## 2014-08-31 NOTE — Patient Instructions (Signed)
Ethinyl Estradiol; Norethindrone tablets What is this medicine? ETHINYL ESTRADIOL; NORETHINDRONE (ETH in il es tra DYE ole; nor eth IN drone) is an oral contraceptive. The products combine two types of female hormones, an estrogen and a progestin. They are used to prevent ovulation and pregnancy. This medicine may be used for other purposes; ask your health care provider or pharmacist if you have questions. COMMON BRAND NAME(S): Bary Richard, Brevicon, BRIELLYN, Cyclafem 1/35, Cyclafem 7/7/7, DASETTA, [place], [place], [place], Necon 0.5/35, Necon 1/35, Necon 10/11, Necon 7/7/7, Norinyl 1/35, Nortrel 0.5/35, Nortrel 1/35, Nortrel 7/7/7, Ortho-Novum 1/35, Ortho-Novum 10/11, Ortho-Novum 7/7/7, Ovcon 8, Ovcon 81, PHILITH, Pirmella, Tri-Norinyl, Vyfemla, WERA, [place] What should I tell my health care provider before I take this medicine? They need to know if you have or ever had any of these conditions: -abnormal vaginal bleeding -blood vessel disease or blood clots -breast, cervical, endometrial, ovarian, liver, or uterine cancer -diabetes -gallbladder disease -heart disease or recent heart attack -high blood pressure -high cholesterol -kidney disease -liver disease -migraine headaches -stroke -systemic lupus erythematosus (SLE) -tobacco smoker -an unusual or allergic reaction to estrogens, progestins, other medicines, foods, dyes, or preservatives -pregnant or trying to get pregnant -breast-feeding How should I use this medicine? Take this medicine by mouth. To reduce nausea, this medicine may be taken with food. Follow the directions on the prescription label. Take this medicine at the same time each day and in the order directed on the package. Do not take your medicine more often than directed. A patient package insert for the product will be given with each prescription and refill. Read this sheet carefully each time. The sheet may change frequently. Contact your  pediatrician regarding the use of this medicine in children. Special care may be needed. This medicine has been used in female children who have started having menstrual periods. Overdosage: If you think you have taken too much of this medicine contact a poison control center or emergency room at once. NOTE: This medicine is only for you. Do not share this medicine with others. What if I miss a dose? If you miss a dose, refer to the patient information sheet you received with your medicine for direction. If you miss more than one pill, this medicine may not be as effective and you may need to use another form of birth control. What may interact with this medicine? -acetaminophen -antibiotics or medicines for infections, especially rifampin, rifabutin, rifapentine, and griseofulvin, and possibly penicillins or tetracyclines -aprepitant -ascorbic acid (vitamin C) -atorvastatin -barbiturate medicines, such as phenobarbital -bosentan -carbamazepine -caffeine -clofibrate -cyclosporine -dantrolene -doxercalciferol -felbamate -grapefruit juice -hydrocortisone -medicines for anxiety or sleeping problems, such as diazepam or temazepam -medicines for diabetes, including pioglitazone -mineral oil -modafinil -mycophenolate -nefazodone -oxcarbazepine -phenytoin -prednisolone -ritonavir or other medicines for HIV infection or AIDS -rosuvastatin -selegiline -soy isoflavones supplements -St. John's wort -tamoxifen or raloxifene -theophylline -thyroid hormones -topiramate -warfarin This list may not describe all possible interactions. Give your health care provider a list of all the medicines, herbs, non-prescription drugs, or dietary supplements you use. Also tell them if you smoke, drink alcohol, or use illegal drugs. Some items may interact with your medicine. What should I watch for while using this medicine? Visit your doctor or health care professional for regular checks on your  progress. You will need a regular breast and pelvic exam and Pap smear while on this medicine. Use an additional method of contraception during the first cycle that you take these tablets. If you have any  reason to think you are pregnant, stop taking this medicine right away and contact your doctor or health care professional. If you are taking this medicine for hormone related problems, it may take several cycles of use to see improvement in your condition. Smoking increases the risk of getting a blood clot or having a stroke while you are taking birth control pills, especially if you are more than 39 years old. You are strongly advised not to smoke. This medicine can make your body retain fluid, making your fingers, hands, or ankles swell. Your blood pressure can go up. Contact your doctor or health care professional if you feel you are retaining fluid. This medicine can make you more sensitive to the sun. Keep out of the sun. If you cannot avoid being in the sun, wear protective clothing and use sunscreen. Do not use sun lamps or tanning beds/booths. If you wear contact lenses and notice visual changes, or if the lenses begin to feel uncomfortable, consult your eye care specialist. In some women, tenderness, swelling, or minor bleeding of the gums may occur. Notify your dentist if this happens. Brushing and flossing your teeth regularly may help limit this. See your dentist regularly and inform your dentist of the medicines you are taking. If you are going to have elective surgery, you may need to stop taking this medicine before the surgery. Consult your health care professional for advice. This medicine does not protect you against HIV infection (AIDS) or any other sexually transmitted diseases. What side effects may I notice from receiving this medicine? Side effects that you should report to your doctor or health care professional as soon as possible: -allergic reactions like skin rash, itching  or hives, swelling of the face, lips, or tongue -breast tissue changes or discharge -changes in vaginal bleeding during your period or between your periods -chest pain -coughing up blood -dizziness or fainting spells -headaches or migraines -leg, arm or groin pain -problems with balance, talking, walking -severe or sudden headaches -severe stomach pain -sudden shortness of breath -symptoms of vaginal infection like itching, irritation or unusual discharge -tenderness in the upper abdomen -vomiting -weakness or numbness in the arms or legs, especially on one side of the body -yellowing of the eyes or skin Side effects that usually do not require medical attention (report to your doctor or health care professional if they continue or are bothersome): -breakthrough bleeding and spotting that continues beyond the 3 initial cycles of pills -breast tenderness -mood changes, anxiety, depression, frustration, anger, or emotional outbursts -increased sensitivity to sun or ultraviolet light -nausea -skin rash, acne, or brown spots on the skin -slight weight gain This list may not describe all possible side effects. Call your doctor for medical advice about side effects. You may report side effects to FDA at 1-800-FDA-1088. Where should I keep my medicine? Keep out of the reach of children. Store at room temperature between 15 and 30 degrees C (59 and 86 degrees F). Throw away any unused medicine after the expiration date. NOTE: This sheet is a summary. It may not cover all possible information. If you have questions about this medicine, talk to your doctor, pharmacist, or health care provider.  2015, Elsevier/Gold Standard. (2008-06-25 13:29:07)

## 2014-10-05 ENCOUNTER — Ambulatory Visit (INDEPENDENT_AMBULATORY_CARE_PROVIDER_SITE_OTHER): Payer: Medicaid Other | Admitting: Family Medicine

## 2014-10-05 ENCOUNTER — Encounter: Payer: Self-pay | Admitting: Family Medicine

## 2014-10-05 VITALS — BP 119/75 | HR 80 | Temp 98.4°F | Ht 65.0 in | Wt 188.7 lb

## 2014-10-05 DIAGNOSIS — D259 Leiomyoma of uterus, unspecified: Secondary | ICD-10-CM

## 2014-10-05 DIAGNOSIS — N926 Irregular menstruation, unspecified: Secondary | ICD-10-CM | POA: Insufficient documentation

## 2014-10-05 DIAGNOSIS — R5383 Other fatigue: Secondary | ICD-10-CM

## 2014-10-05 NOTE — Assessment & Plan Note (Signed)
Patient has a known history of fibroid uterus, myomectomy and tubal ligation. Trial On Depo-Provera did not seem to help her much, patient currently on birth control pills which seem to be helping a little bit with the decrease in bleeding. However she continues to have at least light bleeding daily despite birth control pills. She is overdue for a Pap smear, and we'll schedule to have one completed. Will obtain transvaginal and pelvic ultrasound today to better assess fibroids. Pending upon the results, will refer patient to Dr. Kennon Rounds to discuss further evaluation and options. Educated patient today on possible options of Lupron injections versus hysterectomy.  TSH and CBC today considering increased fatigue, and irregular menses. Need to schedule an appointment to have her Pap smear.

## 2014-10-05 NOTE — Patient Instructions (Signed)
Uterine Fibroid A uterine fibroid is a growth (tumor) that occurs in your uterus. This type of tumor is not cancerous and does not spread out of the uterus. You can have one or many fibroids. Fibroids can vary in size, weight, and where they grow in the uterus. Some can become quite large. Most fibroids do not require medical treatment, but some can cause pain or heavy bleeding during and between periods. CAUSES  A fibroid is the result of a single uterine cell that keeps growing (unregulated), which is different than most cells in the human body. Most cells have a control mechanism that keeps them from reproducing without control.  SIGNS AND SYMPTOMS   Bleeding.  Pelvic pain and pressure.  Bladder problems due to the size of the fibroid.  Infertility and miscarriages depending on the size and location of the fibroid. DIAGNOSIS  Uterine fibroids are diagnosed through a physical exam. Your health care provider may feel the lumpy tumors during a pelvic exam. Ultrasonography may be done to get information regarding size, location, and number of tumors.  TREATMENT   Your health care provider may recommend watchful waiting. This involves getting the fibroid checked by your health care provider to see if it grows or shrinks.   Hormone treatment or an intrauterine device (IUD) may be prescribed.   Surgery may be needed to remove the fibroids (myomectomy) or the uterus (hysterectomy). This depends on your situation. When fibroids interfere with fertility and a woman wants to become pregnant, a health care provider may recommend having the fibroids removed.  [place] care depends on how you were treated. In general:   Keep all follow-up appointments with your health care provider.   Only take over-the-counter or prescription medicines as directed by your health care provider. If you were prescribed a hormone treatment, take the hormone medicines exactly as directed. Do not  take aspirin. It can cause bleeding.   Talk to your health care provider about taking iron pills.  If your periods are troublesome but not so heavy, lie down with your feet raised slightly above your heart. Place cold packs on your lower abdomen.   If your periods are heavy, write down the number of pads or tampons you use per month. Bring this information to your health care provider.   Include green vegetables in your diet.  SEEK IMMEDIATE MEDICAL CARE IF:  You have pelvic pain or cramps not controlled with medicines.   You have a sudden increase in pelvic pain.   You have an increase in bleeding between and during periods.   You have excessive periods and soak tampons or pads in a half hour or less.  You feel lightheaded or have fainting episodes. Document Released: 07/07/2000 Document Revised: 04/30/2013 Document Reviewed: 02/06/2013 [facility] Patient Information 2015 [place], [place]. This information is not intended to replace advice given to you by your health care provider. Make sure you discuss any questions you have with your health care provider.   I'm sorry you're still struggling with your fibroids. I'm glad to see that the bleeding has slightly improved with birth control. Since you're still having complications in pain, left go ahead and get a new ultrasound of your abdomen. Depending upon the results, I will refer you to Dr. Kennon Rounds, she's double certified in family medicine and gynecology, and she can discuss options with you to help with your fibroids.

## 2014-10-05 NOTE — Progress Notes (Signed)
   Subjective:    Patient ID: Carol Logan, female    DOB: 1976/01/01, 39 y.o.   MRN: [id number]  HPI  Uterine fibroids: Patient presents with increased pain she feels associated with her uterine fibroids. She had a uterine myomectomy in 2013, had a baby in 2015, at which time they told her that her uterine fibroids have returned. She was trialed on Depo-Provera November 2015, to attempt to decrease her bleeding pattern from her fibroids. She states that really was not effective, and she had an increased bleeding pattern, as well as headaches. She was then tried on oral birth control pills, which she is on second pack, and feels that it helped a little with her heavy menses. She now has heavy to moderate bleeding that 7 weeks of her menstrual cycle, but continues to lightly bleed daily in which she needs a panty liner. She admits to heavy cramping the recovery prior of her menses and the week of her menses. She has had her tubes tied in 2015, and does not desire anymore children. She denies any chest pain, shortness of breath or syncope. She does admit to increased fatigue and abdominal bloating. Her last menstrual period was 09/17/2014. She takes Motrin and Advil almost daily for discomfort in her abdomen.  Patient is due for Pap smear.  Nonsmoker Past Medical History  Diagnosis Date  . Fibroids   . Ovarian cyst   . No pertinent past medical history   . Headache(784.0)   . GERD (gastroesophageal reflux disease)    No Known Allergies  Review of Systems Per history of present illness    Objective:   Physical Exam BP 119/75 mmHg  Pulse 80  Temp(Src) 98.4 F (36.9 C) (Oral)  Ht 5\' 5"  (1.651 m)  Wt 188 lb 11.2 oz (85.594 kg)  BMI 31.40 kg/m2  LMP 09/17/2014 (Approximate) Gen: Very pleasant, African-American female, in no acute distress, nontoxic in appearance, well-developed, well-nourished, mildly obese. HEENT: AT. [place]. Bilateral eyes without injections or icterus. MMM.  CV: RRR    Abd: Soft. Mild tenderness to palpation right periumbilical. ND. BS present. Right pelvic fullness, ? fibroid.    Assessment & Plan:

## 2014-10-06 ENCOUNTER — Telehealth: Payer: Self-pay | Admitting: Family Medicine

## 2014-10-06 ENCOUNTER — Encounter: Payer: Self-pay | Admitting: Family Medicine

## 2014-10-06 LAB — CBC WITH DIFFERENTIAL/PLATELET
BASOS ABS: 0.1 10*3/uL (ref 0.0–0.1)
BASOS PCT: 1 % (ref 0–1)
EOS ABS: 0.1 10*3/uL (ref 0.0–0.7)
Eosinophils Relative: 1 % (ref 0–5)
HEMATOCRIT: 38 % (ref 36.0–46.0)
Hemoglobin: 12.5 g/dL (ref 12.0–15.0)
LYMPHS ABS: 2.4 10*3/uL (ref 0.7–4.0)
Lymphocytes Relative: 45 % (ref 12–46)
MCH: 27.6 pg (ref 26.0–34.0)
MCHC: 32.9 g/dL (ref 30.0–36.0)
MCV: 83.9 fL (ref 78.0–100.0)
MONO ABS: 0.3 10*3/uL (ref 0.1–1.0)
MONOS PCT: 6 % (ref 3–12)
MPV: 11.5 fL (ref 8.6–12.4)
NEUTROS ABS: 2.5 10*3/uL (ref 1.7–7.7)
Neutrophils Relative %: 47 % (ref 43–77)
Platelets: 243 10*3/uL (ref 150–400)
RBC: 4.53 MIL/uL (ref 3.87–5.11)
RDW: 14.6 % (ref 11.5–15.5)
WBC: 5.3 10*3/uL (ref 4.0–10.5)

## 2014-10-06 LAB — TSH: TSH: 0.338 u[IU]/mL — ABNORMAL LOW (ref 0.350–4.500)

## 2014-10-06 NOTE — Telephone Encounter (Signed)
LVM for patient to call back to inform her of below results.

## 2014-10-06 NOTE — Progress Notes (Signed)
I was available as preceptor to resident for this patient's office visit.  

## 2014-10-06 NOTE — Telephone Encounter (Signed)
Please call the patient, she is not anemic. Her blood counts are all normal. Her thyroid is mildly abnormal, not enough to treat her do anything at this time, however we would want to recheck her thyroid in 6 months. I will mail the results to her. Thanks

## 2014-10-07 NOTE — Telephone Encounter (Signed)
LVM for patient to call back. ?

## 2014-10-08 ENCOUNTER — Ambulatory Visit (HOSPITAL_COMMUNITY)
Admission: RE | Admit: 2014-10-08 | Discharge: 2014-10-08 | Disposition: A | Discharge status: Home / Self Care | Payer: Medicaid Other | Source: Ambulatory Visit | Attending: Family Medicine | Admitting: Family Medicine

## 2014-10-08 DIAGNOSIS — D259 Leiomyoma of uterus, unspecified: Secondary | ICD-10-CM | POA: Diagnosis not present

## 2014-10-08 DIAGNOSIS — N926 Irregular menstruation, unspecified: Secondary | ICD-10-CM | POA: Diagnosis not present

## 2014-10-09 ENCOUNTER — Telehealth: Payer: Self-pay | Admitting: Family Medicine

## 2014-10-09 DIAGNOSIS — N858 Other specified noninflammatory disorders of uterus: Secondary | ICD-10-CM

## 2014-10-09 NOTE — Telephone Encounter (Signed)
Please call pt, her [place] did show a uterine mass that is likely 2 fibroids, but they were not certain. I will refer her to gyn to discuss the possible need for furthering imagining vs fibroid treatment.  I have placed this referral and it should be asap, no urgent, but not a month away either if able. Possibly could be schedule with Dr. Kennon Rounds here?? Thanks.

## 2014-10-12 ENCOUNTER — Encounter: Payer: Self-pay | Admitting: Obstetrics & Gynecology

## 2014-10-19 ENCOUNTER — Emergency Department (HOSPITAL_COMMUNITY)
Admission: EM | Admit: 2014-10-19 | Discharge: 2014-10-19 | Disposition: A | Discharge status: Home / Self Care | Payer: No Typology Code available for payment source | Attending: Emergency Medicine | Admitting: Emergency Medicine

## 2014-10-19 ENCOUNTER — Encounter (HOSPITAL_COMMUNITY): Payer: Self-pay | Admitting: Emergency Medicine

## 2014-10-19 DIAGNOSIS — S76119A Strain of unspecified quadriceps muscle, fascia and tendon, initial encounter: Secondary | ICD-10-CM | POA: Diagnosis not present

## 2014-10-19 DIAGNOSIS — Y998 Other external cause status: Secondary | ICD-10-CM | POA: Insufficient documentation

## 2014-10-19 DIAGNOSIS — Y92410 Unspecified street and highway as the place of occurrence of the external cause: Secondary | ICD-10-CM | POA: Insufficient documentation

## 2014-10-19 DIAGNOSIS — T148XXA Other injury of unspecified body region, initial encounter: Secondary | ICD-10-CM

## 2014-10-19 DIAGNOSIS — Z79899 Other long term (current) drug therapy: Secondary | ICD-10-CM | POA: Diagnosis not present

## 2014-10-19 DIAGNOSIS — Z8719 Personal history of other diseases of the digestive system: Secondary | ICD-10-CM | POA: Insufficient documentation

## 2014-10-19 DIAGNOSIS — Z8742 Personal history of other diseases of the female genital tract: Secondary | ICD-10-CM | POA: Diagnosis not present

## 2014-10-19 DIAGNOSIS — Y9389 Activity, other specified: Secondary | ICD-10-CM | POA: Insufficient documentation

## 2014-10-19 DIAGNOSIS — S0083XA Contusion of other part of head, initial encounter: Secondary | ICD-10-CM | POA: Diagnosis not present

## 2014-10-19 DIAGNOSIS — S0990XA Unspecified injury of head, initial encounter: Secondary | ICD-10-CM | POA: Diagnosis present

## 2014-10-19 MED ORDER — CYCLOBENZAPRINE HCL 10 MG PO TABS: 10.0000 mg | ORAL_TABLET | Freq: Two times a day (BID) | ORAL | Status: DC | PRN

## 2014-10-19 MED ORDER — IBUPROFEN 800 MG PO TABS: 800.0000 mg | ORAL_TABLET | Freq: Three times a day (TID) | ORAL | Status: DC

## 2014-10-19 MED ORDER — HYDROCODONE-ACETAMINOPHEN 5-325 MG PO TABS: 1.0000 | ORAL_TABLET | ORAL | Status: DC | PRN
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
## 2014-10-23 ENCOUNTER — Encounter (HOSPITAL_COMMUNITY): Payer: Self-pay | Admitting: *Deleted

## 2014-10-23 ENCOUNTER — Emergency Department (HOSPITAL_COMMUNITY)
Admission: EM | Admit: 2014-10-23 | Discharge: 2014-10-24 | Disposition: A | Discharge status: Home / Self Care | Payer: No Typology Code available for payment source | Attending: Emergency Medicine | Admitting: Emergency Medicine

## 2014-10-23 ENCOUNTER — Emergency Department (HOSPITAL_COMMUNITY): Payer: No Typology Code available for payment source

## 2014-10-23 DIAGNOSIS — Z8719 Personal history of other diseases of the digestive system: Secondary | ICD-10-CM | POA: Insufficient documentation

## 2014-10-23 DIAGNOSIS — M542 Cervicalgia: Secondary | ICD-10-CM | POA: Diagnosis not present

## 2014-10-23 DIAGNOSIS — Z87828 Personal history of other (healed) physical injury and trauma: Secondary | ICD-10-CM | POA: Insufficient documentation

## 2014-10-23 DIAGNOSIS — M25512 Pain in left shoulder: Secondary | ICD-10-CM | POA: Diagnosis not present

## 2014-10-23 DIAGNOSIS — Z7951 Long term (current) use of inhaled steroids: Secondary | ICD-10-CM | POA: Insufficient documentation

## 2014-10-23 DIAGNOSIS — Z79899 Other long term (current) drug therapy: Secondary | ICD-10-CM | POA: Insufficient documentation

## 2014-10-23 DIAGNOSIS — M25511 Pain in right shoulder: Secondary | ICD-10-CM | POA: Insufficient documentation

## 2014-10-23 DIAGNOSIS — R110 Nausea: Secondary | ICD-10-CM | POA: Diagnosis not present

## 2014-10-23 DIAGNOSIS — Z793 Long term (current) use of hormonal contraceptives: Secondary | ICD-10-CM | POA: Diagnosis not present

## 2014-10-23 DIAGNOSIS — N8320 Unspecified ovarian cysts: Secondary | ICD-10-CM | POA: Insufficient documentation

## 2014-10-23 DIAGNOSIS — R51 Headache: Secondary | ICD-10-CM | POA: Insufficient documentation

## 2014-10-23 DIAGNOSIS — R519 Headache, unspecified: Secondary | ICD-10-CM
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
## 2014-10-24 MED ORDER — OXYCODONE-ACETAMINOPHEN 5-325 MG PO TABS: 1.0000 | ORAL_TABLET | Freq: Four times a day (QID) | ORAL | Status: DC | PRN

## 2014-10-24 NOTE — ED Notes (Signed)
See triage note.  No obvious neuro deficits noted at this time.

## 2014-10-24 NOTE — Discharge Instructions (Signed)
The CT of your head shows no traumatic injury.  No bleeding.  No fracture.  It does show some small vessel disease.  You have been referred to neurology for follow-up if you have persistent headaches.  You have been given a prescription for Percocet to take as directed for your discomfort.  If you develop new or worsening symptoms please return immediately to the emergency department for further evaluation

## 2014-11-12 ENCOUNTER — Encounter: Payer: Medicaid Other | Admitting: Obstetrics & Gynecology

## 2014-11-16 ENCOUNTER — Encounter (HOSPITAL_BASED_OUTPATIENT_CLINIC_OR_DEPARTMENT_OTHER): Payer: Self-pay | Admitting: Emergency Medicine

## 2014-11-25 ENCOUNTER — Encounter: Payer: Medicaid Other | Admitting: Obstetrics & Gynecology

## 2014-11-26 ENCOUNTER — Ambulatory Visit (INDEPENDENT_AMBULATORY_CARE_PROVIDER_SITE_OTHER): Payer: Medicaid Other | Admitting: Obstetrics and Gynecology

## 2014-11-26 ENCOUNTER — Encounter: Payer: Self-pay | Admitting: Obstetrics and Gynecology

## 2014-11-26 VITALS — BP 135/86 | HR 85 | Temp 98.3°F | Ht 66.0 in | Wt 188.9 lb

## 2014-11-26 DIAGNOSIS — D259 Leiomyoma of uterus, unspecified: Secondary | ICD-10-CM

## 2014-11-26 NOTE — Progress Notes (Signed)
Subjective:    Patient ID: Carol Logan, female    DOB: May 10, 1976, 39 y.o.   MRN: [id number]  HPI  39 yo [id number] presenting today for evaluation of uterine mass seen on ultrasound. Patient has a history of myomectomy in 2013. Patient reports irregular vaginal bleeding since the birth of her last child in 69. She reports prolonged episodes of vaginal bleeding. She has taken 1 round of depo-provera and is currently on OCP. She states her bleeding has lessened but is still prolonged lasting 7-10 days. Her menses are also associated with severe dysmenorrhea. Patient was told that she needs a hysterectomy and is here ready to have it scheduled.  Past Medical History  Diagnosis Date  . Fibroids   . Ovarian cyst   . No pertinent past medical history   . Headache(784.0)   . GERD (gastroesophageal reflux disease)    Past Surgical History  Procedure Laterality Date  . Foot surgery    . Myomectomy  10/11/2011    Procedure: MYOMECTOMY;  Surgeon: [name], MD;  Location: [facility];  Service: Gynecology;  Laterality: N/A;  . Cervical cerclage N/A 04/01/2013    Procedure: CERCLAGE CERVICAL;  Surgeon: [name], MD;  Location: [facility];  Service: Gynecology;  Laterality: N/A;  . Cesarean section N/A 09/18/2013    Procedure: CESAREAN SECTION;  Surgeon: [name], MD;  Location: [facility];  Service: Obstetrics;  Laterality: N/A;  . Fracture surgery     Family History  Problem Relation Age of Onset  . High blood pressure Mother   . Glaucoma Paternal Uncle   . Alzheimer's disease Paternal Uncle   . Cancer Neg Hx   . Depression Neg Hx   . Diabetes Neg Hx   . Drug abuse Neg Hx   . Early death Neg Hx   . Alcohol abuse Neg Hx   . Heart disease Neg Hx   . Hyperlipidemia Neg Hx   . Hypertension Neg Hx   . Kidney disease Neg Hx   . Stroke Neg Hx    History  Substance Use Topics  . Smoking status: Never Smoker   . Smokeless tobacco: Never Used  . Alcohol Use: No   Comment: socially- 1 glass of wine monthly     Review of Systems Pertinent in HPI    Objective:   Physical Exam  GENERAL: Well-developed, well-nourished female in no acute distress.  ABDOMEN: Soft, nontender, nondistended. No organomegaly. PELVIC: Normal external female genitalia. Vagina is pink and rugated.  Normal discharge. Normal appearing cervix. Uterus is normal in size. No adnexal mass or tenderness. EXTREMITIES: No cyanosis, clubbing, or edema, 2+ distal pulses.  09/2014 ultrasound FINDINGS: Uterus  Measurements: 9.0 x 6.2 x 4.8 cm. Two fundal masses on the left and one central fundal mass. The central fundal mass is heterogeneous with poorly visualized margins, measuring 2.5 x 2.2 x 2.0 cm. The final masses on the left measure 2.5 x 1.7 x 1.7 cm and 2.3 x 1.9 x 1.8 cm. These are better defined and more hypoechoic and are peripherally located.  Endometrium  Thickness: 5.8 mm. No focal abnormality visualized.  Right ovary  Measurements: 2.4 x 1.5 x 1.3 cm. Normal appearance/no adnexal mass.  Left ovary  Measurements: 2.8 x 1.9 x 1.6 cm. Normal appearance/no adnexal mass.  Other findings  No free fluid.  IMPRESSION: 1. 2.5 cm central fundal mass with poorly visualized margins. Statistically, this most likely represents a fibroid. However, a neoplastic process  cannot be excluded. This may have a mucosal or submucosal component. If clinically indicated, this could be further evaluated with pre and postcontrast magnetic resonance imaging of the pelvis. 2. 2.5 and 2.3 cm peripheral fundal uterine fibroids on the left.   Electronically Signed  By: Claudie Revering M.D.  On: 10/08/2014 16:44     Assessment & Plan:  39 yo with fibroid uterus - Ultrasound results reviewed and explained to the patient - Discussed medical management with progesterone containing IUD vs surgical management with endometrial ablation before considering hysterectomy which  is a major surgery. Patient opted to think about it but is not interested in trying a different form of birth control. - Patient will return when ready for an intervention - RTC prn. Patient will need an endometrial biopsy prior to scheduling the endometrial ablation if interested

## 2014-11-26 NOTE — Patient Instructions (Signed)
Endometrial Ablation Endometrial ablation removes the lining of the uterus (endometrium). It is usually a same-day, outpatient treatment. Ablation helps avoid major surgery, such as surgery to remove the cervix and uterus (hysterectomy). After endometrial ablation, you will have little or no menstrual bleeding and may not be able to have children. However, if you are premenopausal, you will need to use a reliable method of birth control following the procedure because of the small chance that pregnancy can occur. There are different reasons to have this procedure, which include:  Heavy periods.  Bleeding that is causing anemia.  Irregular bleeding.  Bleeding fibroids on the lining inside the uterus if they are smaller than 3 centimeters. This procedure should not be done if:  You want children in the future.  You have severe cramps with your menstrual period.  You have precancerous or cancerous cells in your uterus.  You were recently pregnant.  You have gone through menopause.  You have had major surgery on the uterus, such as a cesarean delivery. LET YOUR HEALTH CARE PROVIDER KNOW ABOUT:  Any allergies you have.  All medicines you are taking, including vitamins, herbs, eye drops, creams, and over-the-counter medicines.  Previous problems you or members of your family have had with the use of anesthetics.  Any blood disorders you have.  Previous surgeries you have had.  Medical conditions you have. RISKS AND COMPLICATIONS  Generally, this is a safe procedure. However, as with any procedure, complications can occur. Possible complications include:  Perforation of the uterus.  Bleeding.  Infection of the uterus, bladder, or vagina.  Injury to surrounding organs.  An air bubble to the lung (air embolus).  Pregnancy following the procedure.  Failure of the procedure to help the problem, requiring hysterectomy.  Decreased ability to diagnose cancer in the lining of  the uterus. BEFORE THE PROCEDURE  The lining of the uterus must be tested to make sure there is no pre-cancerous or cancer cells present.  An ultrasound may be performed to look at the size of the uterus and to check for abnormalities.  Medicines may be given to thin the lining of the uterus. PROCEDURE  During the procedure, your health care provider will use a tool called a resectoscope to help see inside your uterus. There are different ways to remove the lining of your uterus.   Radiofrequency - This method uses a radiofrequency-alternating electric current to remove the lining of the uterus.  Cryotherapy - This method uses extreme cold to freeze the lining of the uterus.  Heated-Free Liquid - This method uses heated salt (saline) solution to remove the lining of the uterus.  Microwave - This method uses high-energy microwaves to heat up the lining of the uterus to remove it.  Thermal balloon - This method involves inserting a catheter with a balloon tip into the uterus. The balloon tip is filled with heated fluid to remove the lining of the uterus. AFTER THE PROCEDURE  After your procedure, do not have sexual intercourse or insert anything into your vagina until permitted by your health care provider. After the procedure, you may experience:  Cramps.  Vaginal discharge.  Frequent urination. Document Released: 05/19/2004 Document Revised: 03/12/2013 Document Reviewed: 12/11/2012 [employer] Patient Information 2015 [employer]. This information is not intended to replace advice given to you by your health care provider. Make sure you discuss any questions you have with your health care provider.  

## 2015-01-16 ENCOUNTER — Emergency Department (HOSPITAL_COMMUNITY)
Admission: EM | Admit: 2015-01-16 | Discharge: 2015-01-16 | Disposition: A | Discharge status: Home / Self Care | Payer: Medicaid Other | Source: Home / Self Care | Attending: Family Medicine | Admitting: Family Medicine

## 2015-01-16 ENCOUNTER — Encounter (HOSPITAL_COMMUNITY): Payer: Self-pay | Admitting: Emergency Medicine

## 2015-01-16 DIAGNOSIS — J020 Streptococcal pharyngitis: Secondary | ICD-10-CM

## 2015-01-16 LAB — POCT RAPID STREP A: Streptococcus, Group A Screen (Direct): NEGATIVE

## 2015-01-16 MED ORDER — AMOXICILLIN 500 MG PO CAPS: 500.0000 mg | ORAL_CAPSULE | Freq: Three times a day (TID) | ORAL | Status: DC

## 2015-01-16 NOTE — ED Provider Notes (Signed)
CSN: [id number]     Arrival date & time 01/16/15  1515 History   First MD Initiated Contact with Patient 01/16/15 1526     Chief Complaint  Patient presents with  . Sore Throat   (Consider location/radiation/quality/duration/timing/severity/associated sxs/prior Treatment) Patient is a 39 y.o. female presenting with pharyngitis. The history is provided by the patient.  Sore Throat This is a new problem. The current episode started 2 days ago. The problem has been gradually worsening. Pertinent negatives include no chest pain and no abdominal pain. The symptoms are aggravated by swallowing.    Past Medical History  Diagnosis Date  . Fibroids   . Ovarian cyst   . No pertinent past medical history   . Headache(784.0)   . GERD (gastroesophageal reflux disease)    Past Surgical History  Procedure Laterality Date  . Foot surgery    . Myomectomy  10/11/2011    Procedure: MYOMECTOMY;  Surgeon: [name], MD;  Location: [facility];  Service: Gynecology;  Laterality: N/A;  . Cervical cerclage N/A 04/01/2013    Procedure: CERCLAGE CERVICAL;  Surgeon: [name], MD;  Location: [facility];  Service: Gynecology;  Laterality: N/A;  . Cesarean section N/A 09/18/2013    Procedure: CESAREAN SECTION;  Surgeon: [name], MD;  Location: [facility];  Service: Obstetrics;  Laterality: N/A;  . Fracture surgery     Family History  Problem Relation Age of Onset  . High blood pressure Mother   . Glaucoma Paternal Uncle   . Alzheimer's disease Paternal Uncle   . Cancer Neg Hx   . Depression Neg Hx   . Diabetes Neg Hx   . Drug abuse Neg Hx   . Early death Neg Hx   . Alcohol abuse Neg Hx   . Heart disease Neg Hx   . Hyperlipidemia Neg Hx   . Hypertension Neg Hx   . Kidney disease Neg Hx   . Stroke Neg Hx    History  Substance Use Topics  . Smoking status: Never Smoker   . Smokeless tobacco: Never Used  . Alcohol Use: No     Comment: socially- 1 glass of wine monthly   OB History     Gravida Para Term Preterm AB TAB SAB Ectopic Multiple Living   4 2 2  0 2 2 0 0 0 2     Review of Systems  Constitutional: Positive for fever, chills and appetite change.  HENT: Positive for sore throat.   Cardiovascular: Negative for chest pain.  Gastrointestinal: Positive for nausea. Negative for abdominal pain.  Musculoskeletal: Positive for myalgias.  Skin: Negative.     Allergies  Review of patient's allergies indicates no known allergies.  Home Medications   Prior to Admission medications   Medication Sig Start Date End Date Taking? Authorizing Provider  amoxicillin (AMOXIL) 500 MG capsule Take 1 capsule (500 mg total) by mouth 3 (three) times daily. 01/16/15   Billy Fischer, MD  Norgestimate-Ethinyl Estradiol Triphasic 0.18/0.215/0.25 MG-25 MCG tab Take 1 tablet by mouth daily. 08/31/14   Tamela Oddi Hess, DO   BP 119/79 mmHg  Pulse 99  Temp(Src) 99.4 F (37.4 C) (Oral)  Resp 16  SpO2 100%  LMP 01/16/2015 Physical Exam  Constitutional: She is oriented to person, place, and time. She appears well-developed and well-nourished. No distress.  HENT:  Right Ear: External ear normal.  Left Ear: External ear normal.  Mouth/Throat: Oropharynx is clear and moist.  Neck: Normal range of motion.  Neck supple.  Pulmonary/Chest: Breath sounds normal.  Lymphadenopathy:    She has cervical adenopathy.  Neurological: She is alert and oriented to person, place, and time.  Skin: Skin is warm and dry.  Nursing note and vitals reviewed.   ED Course  Procedures (including critical care time) Labs Review Labs Reviewed  POCT RAPID STREP A    Imaging Review No results found.   MDM   1. Streptococcal sore throat        Billy Fischer, MD 01/16/15 [phone]

## 2015-01-16 NOTE — ED Notes (Signed)
Pt comes in with c/o cold sx's since last Thursday Body aches, chills and sore throat, taking otc vitamin C/ cough cold medication Afebrile, strep obtained

## 2015-01-19 LAB — CULTURE, GROUP A STREP

## 2015-01-19 NOTE — ED Notes (Signed)
Patient called and notified of strep report

## 2015-01-19 NOTE — ED Notes (Signed)
Final report positive for beta hemolytic strep, covered w amoxicillin

## 2015-01-20 ENCOUNTER — Emergency Department (HOSPITAL_COMMUNITY)
Admission: EM | Admit: 2015-01-20 | Discharge: 2015-01-20 | Disposition: A | Discharge status: Home / Self Care | Payer: Medicaid Other | Source: Home / Self Care | Attending: Emergency Medicine | Admitting: Emergency Medicine

## 2015-01-20 ENCOUNTER — Encounter (HOSPITAL_COMMUNITY): Payer: Self-pay | Admitting: Emergency Medicine

## 2015-01-20 DIAGNOSIS — R0982 Postnasal drip: Secondary | ICD-10-CM | POA: Diagnosis not present

## 2015-01-20 DIAGNOSIS — J302 Other seasonal allergic rhinitis: Secondary | ICD-10-CM

## 2015-01-20 DIAGNOSIS — J020 Streptococcal pharyngitis: Secondary | ICD-10-CM | POA: Diagnosis not present

## 2015-01-20 MED ORDER — IPRATROPIUM BROMIDE 0.06 % NA SOLN: 2.0000 | Freq: Four times a day (QID) | NASAL | Status: DC

## 2015-01-20 NOTE — ED Notes (Signed)
Sore throat that started 6/23, currently taking amoxicillin.  Continued symptoms

## 2015-01-20 NOTE — ED Provider Notes (Signed)
CSN: [id number]     Arrival date & time 01/20/15  1619 History   First MD Initiated Contact with Patient 01/20/15 1643     No chief complaint on file.  (Consider location/radiation/quality/duration/timing/severity/associated sxs/prior Treatment) HPI Comments: 39 year old female was seen in the urgent care 3 days ago for sore throat and treated with amoxicillin. She states about that time she also developed PND, cough, red eyes, change in voice runny nose and stuffy nose. She returns today with the same symptoms. She is not taking any medicines for for symptoms. She has no known history of allergic rhinitis.   Past Medical History  Diagnosis Date  . Fibroids   . Ovarian cyst   . No pertinent past medical history   . Headache(784.0)   . GERD (gastroesophageal reflux disease)    Past Surgical History  Procedure Laterality Date  . Foot surgery    . Myomectomy  10/11/2011    Procedure: MYOMECTOMY;  Surgeon: [name], MD;  Location: [facility];  Service: Gynecology;  Laterality: N/A;  . Cervical cerclage N/A 04/01/2013    Procedure: CERCLAGE CERVICAL;  Surgeon: [name], MD;  Location: [facility];  Service: Gynecology;  Laterality: N/A;  . Cesarean section N/A 09/18/2013    Procedure: CESAREAN SECTION;  Surgeon: [name], MD;  Location: [facility];  Service: Obstetrics;  Laterality: N/A;  . Fracture surgery     Family History  Problem Relation Age of Onset  . High blood pressure Mother   . Glaucoma Paternal Uncle   . Alzheimer's disease Paternal Uncle   . Cancer Neg Hx   . Depression Neg Hx   . Diabetes Neg Hx   . Drug abuse Neg Hx   . Early death Neg Hx   . Alcohol abuse Neg Hx   . Heart disease Neg Hx   . Hyperlipidemia Neg Hx   . Hypertension Neg Hx   . Kidney disease Neg Hx   . Stroke Neg Hx    History  Substance Use Topics  . Smoking status: Never Smoker   . Smokeless tobacco: Never Used  . Alcohol Use: No     Comment: socially- 1 glass of wine monthly    OB History    Gravida Para Term Preterm AB TAB SAB Ectopic Multiple Living   4 2 2  0 2 2 0 0 0 2     Review of Systems  Constitutional: Negative for fever, chills, activity change, appetite change and fatigue.  HENT: Positive for congestion, postnasal drip, rhinorrhea and sore throat. Negative for facial swelling.   Eyes: Positive for redness.  Respiratory: Positive for cough.   Cardiovascular: Negative.   Gastrointestinal: Negative.   Musculoskeletal: Negative for neck pain and neck stiffness.  Skin: Negative for pallor and rash.  Neurological: Negative.     Allergies  Review of patient's allergies indicates no known allergies.  Home Medications   Prior to Admission medications   Medication Sig Start Date End Date Taking? Authorizing Provider  amoxicillin (AMOXIL) 500 MG capsule Take 1 capsule (500 mg total) by mouth 3 (three) times daily. 01/16/15   Billy Fischer, MD  ipratropium (ATROVENT) 0.06 % nasal spray Place 2 sprays into both nostrils 4 (four) times daily. 01/20/15   Janne Napoleon, NP  Norgestimate-Ethinyl Estradiol Triphasic 0.18/0.215/0.25 MG-25 MCG tab Take 1 tablet by mouth daily. 08/31/14   Tamela Oddi Hess, DO   BP 115/67 mmHg  Pulse 82  Temp(Src) 99 F (37.2 C) (Oral)  Resp 16  SpO2 98%  LMP 01/16/2015 Physical Exam  Constitutional: She is oriented to person, place, and time. She appears well-developed and well-nourished. No distress.  HENT:  Mouth/Throat: No oropharyngeal exudate.  Bilateral TMs are normal Oropharynx with erythema, cobblestoning, crepitus amount of clear PND. No exudates.  Eyes: Conjunctivae and EOM are normal.  Neck: Normal range of motion. Neck supple.  Cardiovascular: Normal rate, regular rhythm and normal heart sounds.   Pulmonary/Chest: Effort normal and breath sounds normal. No respiratory distress. She has no wheezes. She has no rales.  Musculoskeletal: Normal range of motion. She exhibits no edema.  Lymphadenopathy:    She has no  cervical adenopathy.  Neurological: She is alert and oriented to person, place, and time.  Skin: Skin is warm and dry. No rash noted.  Psychiatric: She has a normal mood and affect.  Nursing note and vitals reviewed.   ED Course  Procedures (including critical care time) Labs Review Labs Reviewed - No data to display  Imaging Review No results found.   MDM   1. Other seasonal allergic rhinitis   2. Strep pharyngitis   3. PND (post-nasal drip)    Typical S and S of allergic rhinitis. She is already on amoxicillin for  Strep throat.  Atrovent NS You may use Allegra 180 mg, Claritin or Zyrtec for drainage. Atrovent nasal spray as directed for runny nose Flonase or Rhinocort nasal spray daily for allergy symptoms. Drink plenty of fluids and stay well-hydrated If drainage increases during the night despite taking the above medicine for drainage he may take Chlor-Trimeton 2 mg before bedtime. This may cause drowsiness.      Janne Napoleon, NP 01/20/15 1726

## 2015-01-20 NOTE — Discharge Instructions (Signed)
Allergic Rhinitis You may use Allegra 180 mg, Claritin or Zyrtec for drainage. Atrovent nasal spray as directed for runny nose Flonase or Rhinocort nasal spray daily for allergy symptoms. Drink plenty of fluids and stay well-hydrated If drainage increases during the night despite taking the above medicine for drainage he may take Chlor-Trimeton 2 mg before bedtime. This may cause drowsiness. Allergic rhinitis is when the mucous membranes in the nose respond to allergens. Allergens are particles in the air that cause your body to have an allergic reaction. This causes you to release allergic antibodies. Through a chain of events, these eventually cause you to release histamine into the blood stream. Although meant to protect the body, it is this release of histamine that causes your discomfort, such as frequent sneezing, congestion, and an itchy, runny nose.  CAUSES  Seasonal allergic rhinitis (hay fever) is caused by pollen allergens that may come from grasses, trees, and weeds. Year-round allergic rhinitis (perennial allergic rhinitis) is caused by allergens such as house dust mites, pet dander, and mold spores.  SYMPTOMS   Nasal stuffiness (congestion).  Itchy, runny nose with sneezing and tearing of the eyes, often with redness to the eyes.  Drainage in the back of the throat. Soreness of the throat.  Cough  Hoarsness DIAGNOSIS  Your health care provider can help you determine the allergen or allergens that trigger your symptoms. If you and your health care provider are unable to determine the allergen, skin or blood testing may be used. TREATMENT  Allergic rhinitis does not have a cure, but it can be controlled by:  Medicines and allergy shots (immunotherapy).  Avoiding the allergen. Hay fever may often be treated with antihistamines in pill or nasal spray forms. Antihistamines block the effects of histamine. There are over-the-counter medicines that may help with nasal congestion and  swelling around the eyes. Check with your health care provider before taking or giving this medicine.  If avoiding the allergen or the medicine prescribed do not work, there are many new medicines your health care provider can prescribe. Stronger medicine may be used if initial measures are ineffective. Desensitizing injections can be used if medicine and avoidance does not work. Desensitization is when a patient is given ongoing shots until the body becomes less sensitive to the allergen. Make sure you follow up with your health care provider if problems continue. HOME CARE INSTRUCTIONS It is not possible to completely avoid allergens, but you can reduce your symptoms by taking steps to limit your exposure to them. It helps to know exactly what you are allergic to so that you can avoid your specific triggers. SEEK MEDICAL CARE IF:   You have a fever.  You develop a cough that does not stop easily (persistent).  You have shortness of breath.  You start wheezing.  Symptoms interfere with normal daily activities. Document Released: 04/04/2001 Document Revised: 07/15/2013 Document Reviewed: 03/17/2013 [facility] Patient Information 2015 [place], [place]. This information is not intended to replace advice given to you by your health care provider. Make sure you discuss any questions you have with your health care provider.  Laryngitis Laryngitis is redness, soreness, and puffiness (inflammation) of the vocal cords. It causes hoarseness, cough, loss of voice, sore throat, and dry throat. It may be caused by:  Infection.  Too much smoking.  Too much talking or yelling.  Breathing in of toxic fumes.  Allergies.  A backup of acid from your stomach. HOME CARE  Drink enough fluids to keep your  pee (urine) clear or pale yellow.  Rest until you no longer have problems or as told by your doctor.  Breathe in moist air.  Take all medicine as told by your doctor.  Do not smoke.  Talk as  little as possible (this includes whispering).  Write on paper instead of talking until your voice is back to normal.  Follow up with your doctor if you have not improved after 10 days. GET HELP IF:   You have trouble breathing.  You cough up blood.  You have a fever that will not go away.  You have increasing pain.  You have trouble swallowing. MAKE SURE YOU:  Understand these instructions.  Will watch your condition.  Will get help right away if you are not doing well or get worse. Document Released: 06/29/2011 Document Revised: 10/02/2011 Document Reviewed: 06/29/2011 [facility] Patient Information 2015 [place], [place]. This information is not intended to replace advice given to you by your health care provider. Make sure you discuss any questions you have with your health care provider.

## 2015-04-13 ENCOUNTER — Encounter: Payer: Self-pay | Admitting: Family Medicine

## 2015-04-13 ENCOUNTER — Ambulatory Visit (INDEPENDENT_AMBULATORY_CARE_PROVIDER_SITE_OTHER): Payer: Medicaid Other | Admitting: Family Medicine

## 2015-04-13 VITALS — BP 144/125 | HR 101 | Temp 98.2°F | Ht 66.0 in | Wt 181.0 lb

## 2015-04-13 DIAGNOSIS — R1084 Generalized abdominal pain: Secondary | ICD-10-CM | POA: Diagnosis not present

## 2015-04-13 DIAGNOSIS — R109 Unspecified abdominal pain: Secondary | ICD-10-CM

## 2015-04-13 LAB — POCT URINE PREGNANCY: PREG TEST UR: NEGATIVE

## 2015-04-13 NOTE — Assessment & Plan Note (Signed)
Appears to be related to her cycle.  Hx of fibroids as observed on [place] in March 2016 Cycle is controlled with medication but still having pain  Possible for adhesions. Doubtful for IBD, IBS or appendicitis, cholecystitis, or obstruction  Has constipation from time to time but she feels it is related to her cycle  Upreg today is negative  She doesn't want to be on pain medications  - referral to OB/GYN to give her options of possible hysterectomy  - advised f/u if pain worsens

## 2015-04-13 NOTE — Patient Instructions (Signed)
Thank you for coming in,   I have referred you to a gynecologist so they may give you all the options in regards to your fibroids.   Please bring all of your medications with you to each visit.   Sign up for My Chart to have easy access to your labs results, and communication with your Primary care physician   Please feel free to call with any questions or concerns at any time, at [phone]. --Dr. Raeford Razor

## 2015-04-13 NOTE — Progress Notes (Signed)
   Subjective:    Carol Logan - 39 y.o. female MRN [id number]  Date of birth: 03/25/1976  HPI  Carol Logan is here for abdominal pain.  ABDOMINAL PAIN Hx of c-section Her cycle has been regular with pills.  Her cycle was not regular when switching from depo  Myomectomy in 2013. S/p Tubal ligation   US done in March 2016 She takes motrin for her pain and resolves her pain.  Pain is occuring in a more of a generalized manner and throbbing in nature and usually related to her cycle.   Symptoms Nausea/vomiting: no Diarrhea: no Constipation: yes Blood in stool: no Blood in vomit: no Fever: no Dysuria: no Loss of appetite: no Weight loss: no  Vaginal Bleeding: as mentioned  Missed menstrual period: no  Health Maintenance:  Health Maintenance Due  Topic Date Due  . INFLUENZA VACCINE  02/22/2015    -  reports that she has never smoked. She has never used smokeless tobacco. - Review of Systems: Per HPI. - Past Medical History: Patient Active Problem List   Diagnosis Date Noted  . Abdominal pain 04/13/2015  . Fatigue 10/05/2014  . Irregular menses 10/05/2014  . Hyperlipidemia 06/22/2014  . Obesity 06/17/2014  . Fibroid uterus 06/17/2014  . Allergic rhinitis 06/17/2014   - Medications: reviewed and updated Current Outpatient Prescriptions  Medication Sig Dispense Refill  . amoxicillin (AMOXIL) 500 MG capsule Take 1 capsule (500 mg total) by mouth 3 (three) times daily. 30 capsule 0  . ipratropium (ATROVENT) 0.06 % nasal spray Place 2 sprays into both nostrils 4 (four) times daily. 15 mL 12  . Norgestimate-Ethinyl Estradiol Triphasic 0.18/0.215/0.25 MG-25 MCG tab Take 1 tablet by mouth daily. 3 Package 4   No current facility-administered medications for this visit.     Review of Systems See HPI     Objective:   Physical Exam BP 144/125 mmHg  Pulse 101  Temp(Src) 98.2 F (36.8 C) (Oral)  Ht 5\' 6"  (1.676 m)  Wt 181 lb (82.101 kg)  BMI 29.23  kg/m2  LMP 04/08/2015 Gen: NAD, alert, cooperative with exam, well-appearing CV: RRR, good S1/S2, no murmur, no edema,  Resp: CTABL, no wheezes, non-labored Abd: SNTND, BS present, no guarding or organomegaly Skin: no rashes, normal turgor  Neuro: no gross deficits.      Assessment & Plan:   Abdominal pain Appears to be related to her cycle.  Hx of fibroids as observed on [place] in March 2016 Cycle is controlled with medication but still having pain  Possible for adhesions. Doubtful for IBD, IBS or appendicitis, cholecystitis, or obstruction  Has constipation from time to time but she feels it is related to her cycle  Upreg today is negative  She doesn't want to be on pain medications  - referral to OB/GYN to give her options of possible hysterectomy  - advised f/u if pain worsens

## 2015-05-07 ENCOUNTER — Encounter: Payer: Self-pay | Admitting: Obstetrics and Gynecology

## 2015-06-03 ENCOUNTER — Encounter: Payer: Self-pay | Admitting: Obstetrics and Gynecology

## 2015-06-03 ENCOUNTER — Ambulatory Visit (INDEPENDENT_AMBULATORY_CARE_PROVIDER_SITE_OTHER): Payer: Medicaid Other | Admitting: Obstetrics and Gynecology

## 2015-06-03 VITALS — BP 130/75 | HR 72 | Temp 98.4°F | Ht 66.0 in | Wt 184.3 lb

## 2015-06-03 DIAGNOSIS — D259 Leiomyoma of uterus, unspecified: Secondary | ICD-10-CM

## 2015-06-03 DIAGNOSIS — N938 Other specified abnormal uterine and vaginal bleeding: Secondary | ICD-10-CM

## 2015-06-03 MED ORDER — NORGESTIMATE-ETH ESTRADIOL 0.25-35 MG-MCG PO TABS: 1.0000 | ORAL_TABLET | Freq: Every day | ORAL | Status: DC

## 2015-06-03 NOTE — Patient Instructions (Signed)
Endometrial Ablation Endometrial ablation removes the lining of the uterus (endometrium). It is usually a same-day, outpatient treatment. Ablation helps avoid major surgery, such as surgery to remove the cervix and uterus (hysterectomy). After endometrial ablation, you will have little or no menstrual bleeding and may not be able to have children. However, if you are premenopausal, you will need to use a reliable method of birth control following the procedure because of the small chance that pregnancy can occur. There are different reasons to have this procedure. These reasons include:  Heavy periods.  Bleeding that is causing anemia.  Irregular bleeding.  Bleeding fibroids on the lining inside the uterus if they are smaller than 3 centimeters. This procedure may not be possible for you if:   You want to have children in the future.   You have severe cramps with your menstrual period.   You have precancerous or cancerous cells in your uterus.   You were recently pregnant.   You have gone through menopause.   You have had major surgery on your uterus, resulting in thinning of the uterine wall. Surgeries may include:  The removal of one or more uterine fibroids (myomectomy).  A cesarean section with a classic (vertical) incision on your uterus. Ask your health care provider what type of cesarean you had. Sometimes the scar on your skin is different than the scar on your uterus. Even if you have had surgery on your uterus, certain types of ablation may still be safe for you. Talk with your health care provider. LET YOUR HEALTH CARE PROVIDER KNOW ABOUT:  Any allergies you have.  All medicines you are taking, including vitamins, herbs, eye drops, creams, and over-the-counter medicines.  Previous problems you or members of your family have had with the use of anesthetics.  Any blood disorders you have.  Previous surgeries you have had.  Medical conditions you have. RISKS AND  COMPLICATIONS  Generally, this is a safe procedure. However, as with any procedure, complications can occur. Possible complications include:  Perforation of the uterus.  Bleeding.  Infection of the uterus, bladder, or vagina.  Injury to surrounding organs.  An air bubble to the lung (air embolus).  Pregnancy following the procedure.  Failure of the procedure to help the problem, requiring hysterectomy.  Decreased ability to diagnose cancer in the lining of the uterus. BEFORE THE PROCEDURE  The lining of the uterus must be tested to make sure there is no pre-cancerous or cancer cells present.  An ultrasound may be performed to look at the size of the uterus and to check for abnormalities.  Medicines may be given to thin the lining of the uterus. PROCEDURE  During the procedure, your health care provider will use a tool called a resectoscope to help see inside your uterus. There are different ways to remove the lining of your uterus.   Radiofrequency - This method uses a radiofrequency-alternating electric current to remove the lining of the uterus.  Cryotherapy - This method uses extreme cold to freeze the lining of the uterus.  Heated-Free Liquid - This method uses heated salt (saline) solution to remove the lining of the uterus.  Microwave - This method uses high-energy microwaves to heat up the lining of the uterus to remove it.  Thermal balloon - This method involves inserting a catheter with a balloon tip into the uterus. The balloon tip is filled with heated fluid to remove the lining of the uterus. AFTER THE PROCEDURE  After your procedure, do   not have sexual intercourse or insert anything into your vagina until permitted by your health care provider. After the procedure, you may experience:  Cramps.  Vaginal discharge.  Frequent urination.   This information is not intended to replace advice given to you by your health care provider. Make sure you discuss any  questions you have with your health care provider.   Document Released: 05/19/2004 Document Revised: 03/31/2015 Document Reviewed: 12/11/2012 Elsevier Interactive Patient Education 2016 [employer]. Levonorgestrel intrauterine device (IUD) What is this medicine? LEVONORGESTREL IUD (LEE voe nor jes trel) is a contraceptive (birth control) device. The device is placed inside the uterus by a healthcare professional. It is used to prevent pregnancy and can also be used to treat heavy bleeding that occurs during your period. Depending on the device, it can be used for 3 to 5 years. This medicine may be used for other purposes; ask your health care provider or pharmacist if you have questions. What should I tell my health care provider before I take this medicine? They need to know if you have any of these conditions: -abnormal Pap smear -cancer of the breast, uterus, or cervix -diabetes -endometritis -genital or pelvic infection now or in the past -have more than one sexual partner or your partner has more than one partner -heart disease -history of an ectopic or tubal pregnancy -immune system problems -IUD in place -liver disease or tumor -problems with blood clots or take blood-thinners -use intravenous drugs -uterus of unusual shape -vaginal bleeding that has not been explained -an unusual or allergic reaction to levonorgestrel, other hormones, silicone, or polyethylene, medicines, foods, dyes, or preservatives -pregnant or trying to get pregnant -breast-feeding How should I use this medicine? This device is placed inside the uterus by a health care professional. Talk to your pediatrician regarding the use of this medicine in children. Special care may be needed. Overdosage: If you think you have taken too much of this medicine contact a poison control center or emergency room at once. NOTE: This medicine is only for you. Do not share this medicine with others. What if I miss a  dose? This does not apply. What may interact with this medicine? Do not take this medicine with any of the following medications: -amprenavir -bosentan -fosamprenavir This medicine may also interact with the following medications: -aprepitant -barbiturate medicines for inducing sleep or treating seizures -bexarotene -griseofulvin -medicines to treat seizures like carbamazepine, ethotoin, felbamate, oxcarbazepine, phenytoin, topiramate -modafinil -pioglitazone -rifabutin -rifampin -rifapentine -some medicines to treat HIV infection like atazanavir, indinavir, lopinavir, nelfinavir, tipranavir, ritonavir -St. John's wort -warfarin This list may not describe all possible interactions. Give your health care provider a list of all the medicines, herbs, non-prescription drugs, or dietary supplements you use. Also tell them if you smoke, drink alcohol, or use illegal drugs. Some items may interact with your medicine. What should I watch for while using this medicine? Visit your doctor or health care professional for regular check ups. See your doctor if you or your partner has sexual contact with others, becomes HIV positive, or gets a sexual transmitted disease. This product does not protect you against HIV infection (AIDS) or other sexually transmitted diseases. You can check the placement of the IUD yourself by reaching up to the top of your vagina with clean fingers to feel the threads. Do not pull on the threads. It is a good habit to check placement after each menstrual period. Call your doctor right away if you feel more of the  IUD than just the threads or if you cannot feel the threads at all. The IUD may come out by itself. You may become pregnant if the device comes out. If you notice that the IUD has come out use a backup birth control method like condoms and call your health care provider. Using tampons will not change the position of the IUD and are okay to use during your  period. What side effects may I notice from receiving this medicine? Side effects that you should report to your doctor or health care professional as soon as possible: -allergic reactions like skin rash, itching or hives, swelling of the face, lips, or tongue -fever, flu-like symptoms -genital sores -high blood pressure -no menstrual period for 6 weeks during use -pain, swelling, warmth in the leg -pelvic pain or tenderness -severe or sudden headache -signs of pregnancy -stomach cramping -sudden shortness of breath -trouble with balance, talking, or walking -unusual vaginal bleeding, discharge -yellowing of the eyes or skin Side effects that usually do not require medical attention (report to your doctor or health care professional if they continue or are bothersome): -acne -breast pain -change in sex drive or performance -changes in weight -cramping, dizziness, or faintness while the device is being inserted -headache -irregular menstrual bleeding within first 3 to 6 months of use -nausea This list may not describe all possible side effects. Call your doctor for medical advice about side effects. You may report side effects to FDA at 1-800-FDA-1088. Where should I keep my medicine? This does not apply. NOTE: This sheet is a summary. It may not cover all possible information. If you have questions about this medicine, talk to your doctor, pharmacist, or health care provider.    2016, Elsevier/Gold Standard. (2011-08-10 13:54:04)

## 2015-06-03 NOTE — Progress Notes (Signed)
Patient ID: Carol Logan, female   DOB: 1976-01-30, 39 y.o.   MRN: [id number] 39 yo [id number] presenting today for management of fibroid uterus and abnormal uterine bleeding. Patient is currently using a triphasic OCP. She reports 10-12 days cycles which are heavy in flow and associated with dysmenorrhea controlled with ibuprofen. She has been taking this birth control for the past 1 1/2 year. Patient also reports some generalized abdominal pain not always associated with her cycle. She reports experiencing constipation and relief of her abdominal pain following a bowel movement. She treats her constipation with dulcolax. She reports the development of constipation since 2013 or 2014 following her myomectomy.  Past Medical History  Diagnosis Date  . Fibroids   . Ovarian cyst   . No pertinent past medical history   . Headache(784.0)   . GERD (gastroesophageal reflux disease)    Past Surgical History  Procedure Laterality Date  . Foot surgery    . Myomectomy  10/11/2011    Procedure: MYOMECTOMY;  Surgeon: [name], MD;  Location: [facility];  Service: Gynecology;  Laterality: N/A;  . Cervical cerclage N/A 04/01/2013    Procedure: CERCLAGE CERVICAL;  Surgeon: [name], MD;  Location: [facility];  Service: Gynecology;  Laterality: N/A;  . Cesarean section N/A 09/18/2013    Procedure: CESAREAN SECTION;  Surgeon: [name], MD;  Location: [facility];  Service: Obstetrics;  Laterality: N/A;  . Fracture surgery     Family History  Problem Relation Age of Onset  . High blood pressure Mother   . Glaucoma Paternal Uncle   . Alzheimer's disease Paternal Uncle   . Cancer Neg Hx   . Depression Neg Hx   . Diabetes Neg Hx   . Drug abuse Neg Hx   . Early death Neg Hx   . Alcohol abuse Neg Hx   . Heart disease Neg Hx   . Hyperlipidemia Neg Hx   . Hypertension Neg Hx   . Kidney disease Neg Hx   . Stroke Neg Hx    Social History  Substance Use Topics  . Smoking status: Never  Smoker   . Smokeless tobacco: Never Used  . Alcohol Use: No     Comment: socially- 1 glass of wine monthly   ROS See pertinent in HPI  Blood pressure 130/75, pulse 72, temperature 98.4 F (36.9 C), temperature source Oral, height 5\' 6"  (1.676 m), weight 184 lb 4.8 oz (83.598 kg), last menstrual period 05/08/2015, not currently breastfeeding. GENERAL: Well-developed, well-nourished female in no acute distress.  ABDOMEN: Soft, nontender, nondistended. No organomegaly. PELVIC: Not performed EXTREMITIES: No cyanosis, clubbing, or edema, 2+ distal pulses.  Ultrasound 09/2014 Uterus  Measurements: 9.0 x 6.2 x 4.8 cm. Two fundal masses on the left and one central fundal mass. The central fundal mass is heterogeneous with poorly visualized margins, measuring 2.5 x 2.2 x 2.0 cm. The final masses on the left measure 2.5 x 1.7 x 1.7 cm and 2.3 x 1.9 x 1.8 cm. These are better defined and more hypoechoic and are peripherally located.  Endometrium  Thickness: 5.8 mm. No focal abnormality visualized.  Right ovary  Measurements: 2.4 x 1.5 x 1.3 cm. Normal appearance/no adnexal mass.  Left ovary  Measurements: 2.8 x 1.9 x 1.6 cm. Normal appearance/no adnexal mass.  Other findings  No free fluid.  IMPRESSION: 1. 2.5 cm central fundal mass with poorly visualized margins. Statistically, this most likely represents a fibroid. However, a neoplastic process cannot be  excluded. This may have a mucosal or submucosal component. If clinically indicated, this could be further evaluated with pre and postcontrast magnetic resonance imaging of the pelvis. 2. 2.5 and 2.3 cm peripheral fundal uterine fibroids on the left.   A/P 39 yo with fibroid uterus and abnormal uterine bleeding on triphasic OCP - Will change Rx Sprintec - Information provided on Mirena IUD and endometrial ablation - RTC on 3-4 months for further management if OCP fails - Discussed increasing water and fiber  intake to help with constipation

## 2015-09-10 ENCOUNTER — Other Ambulatory Visit: Payer: Self-pay

## 2015-09-10 DIAGNOSIS — Z1231 Encounter for screening mammogram for malignant neoplasm of breast: Secondary | ICD-10-CM

## 2015-09-21 ENCOUNTER — Encounter: Payer: Self-pay | Admitting: Internal Medicine

## 2015-09-21 ENCOUNTER — Other Ambulatory Visit (INDEPENDENT_AMBULATORY_CARE_PROVIDER_SITE_OTHER): Payer: 59

## 2015-09-21 ENCOUNTER — Ambulatory Visit (INDEPENDENT_AMBULATORY_CARE_PROVIDER_SITE_OTHER): Payer: 59 | Admitting: Internal Medicine

## 2015-09-21 VITALS — BP 110/80 | HR 74 | Temp 98.1°F | Resp 16 | Ht 66.0 in | Wt 183.0 lb

## 2015-09-21 DIAGNOSIS — Z0000 Encounter for general adult medical examination without abnormal findings: Secondary | ICD-10-CM | POA: Insufficient documentation

## 2015-09-21 DIAGNOSIS — E785 Hyperlipidemia, unspecified: Secondary | ICD-10-CM

## 2015-09-21 LAB — COMPREHENSIVE METABOLIC PANEL
ALT: 12 U/L (ref 0–35)
AST: 16 U/L (ref 0–37)
Albumin: 4.1 g/dL (ref 3.5–5.2)
Alkaline Phosphatase: 59 U/L (ref 39–117)
BUN: 16 mg/dL (ref 6–23)
CO2: 28 mEq/L (ref 19–32)
Calcium: 9.6 mg/dL (ref 8.4–10.5)
Chloride: 107 mEq/L (ref 96–112)
Creatinine, Ser: 0.92 mg/dL (ref 0.40–1.20)
GFR: 86.96 mL/min (ref >60.00)
Glucose, Bld: 92 mg/dL (ref 70–99)
Potassium: 5.1 mEq/L (ref 3.5–5.1)
Sodium: 139 mEq/L (ref 135–145)
Total Bilirubin: 0.3 mg/dL (ref 0.2–1.2)
Total Protein: 7.9 g/dL (ref 6.0–8.3)

## 2015-09-21 LAB — CBC WITH DIFFERENTIAL/PLATELET
Basophils Absolute: 0 10*3/uL (ref 0.0–0.1)
Basophils Relative: 0.7 % (ref 0.0–3.0)
Eosinophils Absolute: 0.1 10*3/uL (ref 0.0–0.7)
Eosinophils Relative: 0.9 % (ref 0.0–5.0)
HCT: 36.4 % (ref 36.0–46.0)
Hemoglobin: 12.3 g/dL (ref 12.0–15.0)
Lymphocytes Relative: 40.8 % (ref 12.0–46.0)
Lymphs Abs: 2.6 10*3/uL (ref 0.7–4.0)
MCHC: 33.7 g/dL (ref 30.0–36.0)
MCV: 89.8 fl (ref 78.0–100.0)
Monocytes Absolute: 0.5 10*3/uL (ref 0.1–1.0)
Monocytes Relative: 7.7 % (ref 3.0–12.0)
Neutro Abs: 3.2 10*3/uL (ref 1.4–7.7)
Neutrophils Relative %: 49.9 % (ref 43.0–77.0)
Platelets: 205 10*3/uL (ref 150.0–400.0)
RBC: 4.05 Mil/uL (ref 3.87–5.11)
RDW: 14.1 % (ref 11.5–15.5)
WBC: 6.4 10*3/uL (ref 4.0–10.5)

## 2015-09-21 LAB — LIPID PANEL
CHOLESTEROL: 270 mg/dL — AB (ref 0–200)
HDL: 56.5 mg/dL (ref >39.00)
LDL Cholesterol: 197 mg/dL — ABNORMAL HIGH (ref 0–99)
NonHDL: 213.66
Total CHOL/HDL Ratio: 5
Triglycerides: 82 mg/dL (ref 0.0–149.0)
VLDL: 16.4 mg/dL (ref 0.0–40.0)

## 2015-09-21 LAB — TSH: TSH: 0.38 u[IU]/mL (ref 0.35–4.50)

## 2015-09-21 LAB — HCG, QUANTITATIVE, PREGNANCY: Quantitative HCG: 0.24 m[IU]/mL

## 2015-09-21 NOTE — Progress Notes (Signed)
Pre visit review using our clinic review tool, if applicable. No additional management support is needed unless otherwise documented below in the visit note. 

## 2015-09-21 NOTE — Patient Instructions (Signed)
Preventive Care for Adults, Female A healthy lifestyle and preventive care can promote health and wellness. Preventive health guidelines for women include the following key practices.  A routine yearly physical is a good way to check with your health care provider about your health and preventive screening. It is a chance to share any concerns and updates on your health and to receive a thorough exam.  Visit your dentist for a routine exam and preventive care every 6 months. Brush your teeth twice a day and floss once a day. Good oral hygiene prevents tooth decay and gum disease.  The frequency of eye exams is based on your age, health, family medical history, use of contact lenses, and other factors. Follow your health care provider's recommendations for frequency of eye exams.  Eat a healthy diet. Foods like vegetables, fruits, whole grains, low-fat dairy products, and lean protein foods contain the nutrients you need without too many calories. Decrease your intake of foods high in solid fats, added sugars, and salt. Eat the right amount of calories for you.Get information about a proper diet from your health care provider, if necessary.  Regular physical exercise is one of the most important things you can do for your health. Most adults should get at least 150 minutes of moderate-intensity exercise (any activity that increases your heart rate and causes you to sweat) each week. In addition, most adults need muscle-strengthening exercises on 2 or more days a week.  Maintain a healthy weight. The body mass index (BMI) is a screening tool to identify possible weight problems. It provides an estimate of body fat based on height and weight. Your health care provider can find your BMI and can help you achieve or maintain a healthy weight.For adults 20 years and older:  A BMI below 18.5 is considered underweight.  A BMI of 18.5 to 24.9 is normal.  A BMI of 25 to 29.9 is considered overweight.  A  BMI of 30 and above is considered obese.  Maintain normal blood lipids and cholesterol levels by exercising and minimizing your intake of saturated fat. Eat a balanced diet with plenty of fruit and vegetables. Blood tests for lipids and cholesterol should begin at age 20 and be repeated every 5 years. If your lipid or cholesterol levels are high, you are over 50, or you are at high risk for heart disease, you may need your cholesterol levels checked more frequently.Ongoing high lipid and cholesterol levels should be treated with medicines if diet and exercise are not working.  If you smoke, find out from your health care provider how to quit. If you do not use tobacco, do not start.  Lung cancer screening is recommended for adults aged 33-80 years who are at high risk for developing lung cancer because of a history of smoking. A yearly low-dose CT scan of the lungs is recommended for people who have at least a 30-pack-year history of smoking and are a current smoker or have quit within the past 15 years. A pack year of smoking is smoking an average of 1 pack of cigarettes a day for 1 year (for example: 1 pack a day for 30 years or 2 packs a day for 15 years). Yearly screening should continue until the smoker has stopped smoking for at least 15 years. Yearly screening should be stopped for people who develop a health problem that would prevent them from having lung cancer treatment.  If you are pregnant, do not drink alcohol. If you are  breastfeeding, be very cautious about drinking alcohol. If you are not pregnant and choose to drink alcohol, do not have more than 1 drink per day. One drink is considered to be 12 ounces (355 mL) of beer, 5 ounces (148 mL) of wine, or 1.5 ounces (44 mL) of liquor.  Avoid use of street drugs. Do not share needles with anyone. Ask for help if you need support or instructions about stopping the use of drugs.  High blood pressure causes heart disease and increases the risk  of stroke. Your blood pressure should be checked at least every 1 to 2 years. Ongoing high blood pressure should be treated with medicines if weight loss and exercise do not work.  If you are 55-79 years old, ask your health care provider if you should take aspirin to prevent strokes.  Diabetes screening is done by taking a blood sample to check your blood glucose level after you have not eaten for a certain period of time (fasting). If you are not overweight and you do not have risk factors for diabetes, you should be screened once every 3 years starting at age 45. If you are overweight or obese and you are 40-70 years of age, you should be screened for diabetes every year as part of your cardiovascular risk assessment.  Breast cancer screening is essential preventive care for women. You should practice "breast self-awareness." This means understanding the normal appearance and feel of your breasts and may include breast self-examination. Any changes detected, no matter how small, should be reported to a health care provider. Women in their 20s and 30s should have a clinical breast exam (CBE) by a health care provider as part of a regular health exam every 1 to 3 years. After age 40, women should have a CBE every year. Starting at age 40, women should consider having a mammogram (breast X-ray test) every year. Women who have a family history of breast cancer should talk to their health care provider about genetic screening. Women at a high risk of breast cancer should talk to their health care providers about having an MRI and a mammogram every year.  Breast cancer gene (BRCA)-related cancer risk assessment is recommended for women who have family members with BRCA-related cancers. BRCA-related cancers include breast, ovarian, tubal, and peritoneal cancers. Having family members with these cancers may be associated with an increased risk for harmful changes (mutations) in the breast cancer genes BRCA1 and  BRCA2. Results of the assessment will determine the need for genetic counseling and BRCA1 and BRCA2 testing.  Your health care provider may recommend that you be screened regularly for cancer of the pelvic organs (ovaries, uterus, and vagina). This screening involves a pelvic examination, including checking for microscopic changes to the surface of your cervix (Pap test). You may be encouraged to have this screening done every 3 years, beginning at age 21.  For women ages 30-65, health care providers may recommend pelvic exams and Pap testing every 3 years, or they may recommend the Pap and pelvic exam, combined with testing for human papilloma virus (HPV), every 5 years. Some types of HPV increase your risk of cervical cancer. Testing for HPV may also be done on women of any age with unclear Pap test results.  Other health care providers may not recommend any screening for nonpregnant women who are considered low risk for pelvic cancer and who do not have symptoms. Ask your health care provider if a screening pelvic exam is right for   you.  If you have had past treatment for cervical cancer or a condition that could lead to cancer, you need Pap tests and screening for cancer for at least 20 years after your treatment. If Pap tests have been discontinued, your risk factors (such as having a new sexual partner) need to be reassessed to determine if screening should resume. Some women have medical problems that increase the chance of getting cervical cancer. In these cases, your health care provider may recommend more frequent screening and Pap tests.  Colorectal cancer can be detected and often prevented. Most routine colorectal cancer screening begins at the age of 50 years and continues through age 75 years. However, your health care provider may recommend screening at an earlier age if you have risk factors for colon cancer. On a yearly basis, your health care provider may provide home test kits to check  for hidden blood in the stool. Use of a small camera at the end of a tube, to directly examine the colon (sigmoidoscopy or colonoscopy), can detect the earliest forms of colorectal cancer. Talk to your health care provider about this at age 50, when routine screening begins. Direct exam of the colon should be repeated every 5-10 years through age 75 years, unless early forms of precancerous polyps or small growths are found.  People who are at an increased risk for hepatitis B should be screened for this virus. You are considered at high risk for hepatitis B if:  You were born in a country where hepatitis B occurs often. Talk with your health care provider about which countries are considered high risk.  Your parents were born in a high-risk country and you have not received a shot to protect against hepatitis B (hepatitis B vaccine).  You have HIV or AIDS.  You use needles to inject street drugs.  You live with, or have sex with, someone who has hepatitis B.  You get hemodialysis treatment.  You take certain medicines for conditions like cancer, organ transplantation, and autoimmune conditions.  Hepatitis C blood testing is recommended for all people born from 1945 through 1965 and any individual with known risks for hepatitis C.  Practice safe sex. Use condoms and avoid high-risk sexual practices to reduce the spread of sexually transmitted infections (STIs). STIs include gonorrhea, chlamydia, syphilis, trichomonas, herpes, HPV, and human immunodeficiency virus (HIV). Herpes, HIV, and HPV are viral illnesses that have no cure. They can result in disability, cancer, and death.  You should be screened for sexually transmitted illnesses (STIs) including gonorrhea and chlamydia if:  You are sexually active and are younger than 24 years.  You are older than 24 years and your health care provider tells you that you are at risk for this type of infection.  Your sexual activity has changed  since you were last screened and you are at an increased risk for chlamydia or gonorrhea. Ask your health care provider if you are at risk.  If you are at risk of being infected with HIV, it is recommended that you take a prescription medicine daily to prevent HIV infection. This is called preexposure prophylaxis (PrEP). You are considered at risk if:  You are sexually active and do not regularly use condoms or know the HIV status of your partner(s).  You take drugs by injection.  You are sexually active with a partner who has HIV.  Talk with your health care provider about whether you are at high risk of being infected with HIV. If   you choose to begin PrEP, you should first be tested for HIV. You should then be tested every 3 months for as long as you are taking PrEP.  Osteoporosis is a disease in which the bones lose minerals and strength with aging. This can result in serious bone fractures or breaks. The risk of osteoporosis can be identified using a bone density scan. Women ages 67 years and over and women at risk for fractures or osteoporosis should discuss screening with their health care providers. Ask your health care provider whether you should take a calcium supplement or vitamin D to reduce the rate of osteoporosis.  Menopause can be associated with physical symptoms and risks. Hormone replacement therapy is available to decrease symptoms and risks. You should talk to your health care provider about whether hormone replacement therapy is right for you.  Use sunscreen. Apply sunscreen liberally and repeatedly throughout the day. You should seek shade when your shadow is shorter than you. Protect yourself by wearing long sleeves, pants, a wide-brimmed hat, and sunglasses year round, whenever you are outdoors.  Once a month, do a whole body skin exam, using a mirror to look at the skin on your back. Tell your health care provider of new moles, moles that have irregular borders, moles that  are larger than a pencil eraser, or moles that have changed in shape or color.  Stay current with required vaccines (immunizations).  Influenza vaccine. All adults should be immunized every year.  Tetanus, diphtheria, and acellular pertussis (Td, Tdap) vaccine. Pregnant women should receive 1 dose of Tdap vaccine during each pregnancy. The dose should be obtained regardless of the length of time since the last dose. Immunization is preferred during the 27th-36th week of gestation. An adult who has not previously received Tdap or who does not know her vaccine status should receive 1 dose of Tdap. This initial dose should be followed by tetanus and diphtheria toxoids (Td) booster doses every 10 years. Adults with an unknown or incomplete history of completing a 3-dose immunization series with Td-containing vaccines should begin or complete a primary immunization series including a Tdap dose. Adults should receive a Td booster every 10 years.  Varicella vaccine. An adult without evidence of immunity to varicella should receive 2 doses or a second dose if she has previously received 1 dose. Pregnant females who do not have evidence of immunity should receive the first dose after pregnancy. This first dose should be obtained before leaving the health care facility. The second dose should be obtained 4-8 weeks after the first dose.  Human papillomavirus (HPV) vaccine. Females aged 13-26 years who have not received the vaccine previously should obtain the 3-dose series. The vaccine is not recommended for use in pregnant females. However, pregnancy testing is not needed before receiving a dose. If a female is found to be pregnant after receiving a dose, no treatment is needed. In that case, the remaining doses should be delayed until after the pregnancy. Immunization is recommended for any person with an immunocompromised condition through the age of 61 years if she did not get any or all doses earlier. During the  3-dose series, the second dose should be obtained 4-8 weeks after the first dose. The third dose should be obtained 24 weeks after the first dose and 16 weeks after the second dose.  Zoster vaccine. One dose is recommended for adults aged 30 years or older unless certain conditions are present.  Measles, mumps, and rubella (MMR) vaccine. Adults born  before 1957 generally are considered immune to measles and mumps. Adults born in [birth year] or later should have 1 or more doses of MMR vaccine unless there is a contraindication to the vaccine or there is laboratory evidence of immunity to each of the three diseases. A routine second dose of MMR vaccine should be obtained at least 28 days after the first dose for students attending postsecondary schools, health care workers, or international travelers. People who received inactivated measles vaccine or an unknown type of measles vaccine during 1963-1967 should receive 2 doses of MMR vaccine. People who received inactivated mumps vaccine or an unknown type of mumps vaccine before 1979 and are at high risk for mumps infection should consider immunization with 2 doses of MMR vaccine. For females of childbearing age, rubella immunity should be determined. If there is no evidence of immunity, females who are not pregnant should be vaccinated. If there is no evidence of immunity, females who are pregnant should delay immunization until after pregnancy. Unvaccinated health care workers born before [birth year] who lack laboratory evidence of measles, mumps, or rubella immunity or laboratory confirmation of disease should consider measles and mumps immunization with 2 doses of MMR vaccine or rubella immunization with 1 dose of MMR vaccine.  Pneumococcal 13-valent conjugate (PCV13) vaccine. When indicated, a person who is uncertain of his immunization history and has no record of immunization should receive the PCV13 vaccine. All adults 83 years of age and older should receive this  vaccine. An adult aged 44 years or older who has certain medical conditions and has not been previously immunized should receive 1 dose of PCV13 vaccine. This PCV13 should be followed with a dose of pneumococcal polysaccharide (PPSV23) vaccine. Adults who are at high risk for pneumococcal disease should obtain the PPSV23 vaccine at least 8 weeks after the dose of PCV13 vaccine. Adults older than 40 years of age who have normal immune system function should obtain the PPSV23 vaccine dose at least 1 year after the dose of PCV13 vaccine.  Pneumococcal polysaccharide (PPSV23) vaccine. When PCV13 is also indicated, PCV13 should be obtained first. All adults aged 19 years and older should be immunized. An adult younger than age 96 years who has certain medical conditions should be immunized. Any person who resides in a nursing home or long-term care facility should be immunized. An adult smoker should be immunized. People with an immunocompromised condition and certain other conditions should receive both PCV13 and PPSV23 vaccines. People with human immunodeficiency virus (HIV) infection should be immunized as soon as possible after diagnosis. Immunization during chemotherapy or radiation therapy should be avoided. Routine use of PPSV23 vaccine is not recommended for American Indians, [place] Natives, or people younger than 65 years unless there are medical conditions that require PPSV23 vaccine. When indicated, people who have unknown immunization and have no record of immunization should receive PPSV23 vaccine. One-time revaccination 5 years after the first dose of PPSV23 is recommended for people aged 19-64 years who have chronic kidney failure, nephrotic syndrome, asplenia, or immunocompromised conditions. People who received 1-2 doses of PPSV23 before age 86 years should receive another dose of PPSV23 vaccine at age 22 years or later if at least 5 years have passed since the previous dose. Doses of PPSV23 are not  needed for people immunized with PPSV23 at or after age 43 years.  Meningococcal vaccine. Adults with asplenia or persistent complement component deficiencies should receive 2 doses of quadrivalent meningococcal conjugate (MenACWY-D) vaccine. The doses should be obtained  at least 2 months apart. Microbiologists working with certain meningococcal bacteria, [employer] recruits, people at risk during an outbreak, and people who travel to or live in countries with a high rate of meningitis should be immunized. A first-year college student up through age 58 years who is living in a residence hall should receive a dose if she did not receive a dose on or after her 16th birthday. Adults who have certain high-risk conditions should receive one or more doses of vaccine.  Hepatitis A vaccine. Adults who wish to be protected from this disease, have certain high-risk conditions, work with hepatitis A-infected animals, work in hepatitis A research labs, or travel to or work in countries with a high rate of hepatitis A should be immunized. Adults who were previously unvaccinated and who anticipate close contact with an international adoptee during the first 60 days after arrival in the [place] from a country with a high rate of hepatitis A should be immunized.  Hepatitis B vaccine. Adults who wish to be protected from this disease, have certain high-risk conditions, may be exposed to blood or other infectious body fluids, are household contacts or sex partners of hepatitis B positive people, are clients or workers in certain care facilities, or travel to or work in countries with a high rate of hepatitis B should be immunized.  Haemophilus influenzae type b (Hib) vaccine. A previously unvaccinated person with asplenia or sickle cell disease or having a scheduled splenectomy should receive 1 dose of Hib vaccine. Regardless of previous immunization, a recipient of a hematopoietic stem cell transplant should receive a  3-dose series 6-12 months after her successful transplant. Hib vaccine is not recommended for adults with HIV infection. Preventive Services / Frequency Ages 90 to 30 years  Blood pressure check.** / Every 3-5 years.  Lipid and cholesterol check.** / Every 5 years beginning at age 74.  Clinical breast exam.** / Every 3 years for women in their 80s and 74s.  BRCA-related cancer risk assessment.** / For women who have family members with a BRCA-related cancer (breast, ovarian, tubal, or peritoneal cancers).  Pap test.** / Every 2 years from ages 93 through 20. Every 3 years starting at age 33 through age 16 or 24 with a history of 3 consecutive normal Pap tests.  HPV screening.** / Every 3 years from ages 79 through ages 70 to 100 with a history of 3 consecutive normal Pap tests.  Hepatitis C blood test.** / For any individual with known risks for hepatitis C.  Skin self-exam. / Monthly.  Influenza vaccine. / Every year.  Tetanus, diphtheria, and acellular pertussis (Tdap, Td) vaccine.** / Consult your health care provider. Pregnant women should receive 1 dose of Tdap vaccine during each pregnancy. 1 dose of Td every 10 years.  Varicella vaccine.** / Consult your health care provider. Pregnant females who do not have evidence of immunity should receive the first dose after pregnancy.  HPV vaccine. / 3 doses over 6 months, if 23 and younger. The vaccine is not recommended for use in pregnant females. However, pregnancy testing is not needed before receiving a dose.  Measles, mumps, rubella (MMR) vaccine.** / You need at least 1 dose of MMR if you were born in 1957 or later. You may also need a 2nd dose. For females of childbearing age, rubella immunity should be determined. If there is no evidence of immunity, females who are not pregnant should be vaccinated. If there is no evidence of immunity, females who are  pregnant should delay immunization until after pregnancy.  Pneumococcal  13-valent conjugate (PCV13) vaccine.** / Consult your health care provider.  Pneumococcal polysaccharide (PPSV23) vaccine.** / 1 to 2 doses if you smoke cigarettes or if you have certain conditions.  Meningococcal vaccine.** / 1 dose if you are age 2 to 46 years and a [employer] researcher living in a residence hall, or have one of several medical conditions, you need to get vaccinated against meningococcal disease. You may also need additional booster doses.  Hepatitis A vaccine.** / Consult your health care provider.  Hepatitis B vaccine.** / Consult your health care provider.  Haemophilus influenzae type b (Hib) vaccine.** / Consult your health care provider. Ages 47 to 42 years  Blood pressure check.** / Every year.  Lipid and cholesterol check.** / Every 5 years beginning at age 30 years.  Lung cancer screening. / Every year if you are aged 5-80 years and have a 30-pack-year history of smoking and currently smoke or have quit within the past 15 years. Yearly screening is stopped once you have quit smoking for at least 15 years or develop a health problem that would prevent you from having lung cancer treatment.  Clinical breast exam.** / Every year after age 94 years.  BRCA-related cancer risk assessment.** / For women who have family members with a BRCA-related cancer (breast, ovarian, tubal, or peritoneal cancers).  Mammogram.** / Every year beginning at age 65 years and continuing for as long as you are in good health. Consult with your health care provider.  Pap test.** / Every 3 years starting at age 37 years through age 49 or 79 years with a history of 3 consecutive normal Pap tests.  HPV screening.** / Every 3 years from ages 68 years through ages 53 to 64 years with a history of 3 consecutive normal Pap tests.  Fecal occult blood test (FOBT) of stool. / Every year beginning at age 56 years and continuing until age 63 years. You may not need to do this test if you get  a colonoscopy every 10 years.  Flexible sigmoidoscopy or colonoscopy.** / Every 5 years for a flexible sigmoidoscopy or every 10 years for a colonoscopy beginning at age 68 years and continuing until age 32 years.  Hepatitis C blood test.** / For all people born from [birth year] through 1965 and any individual with known risks for hepatitis C.  Skin self-exam. / Monthly.  Influenza vaccine. / Every year.  Tetanus, diphtheria, and acellular pertussis (Tdap/Td) vaccine.** / Consult your health care provider. Pregnant women should receive 1 dose of Tdap vaccine during each pregnancy. 1 dose of Td every 10 years.  Varicella vaccine.** / Consult your health care provider. Pregnant females who do not have evidence of immunity should receive the first dose after pregnancy.  Zoster vaccine.** / 1 dose for adults aged 60 years or older.  Measles, mumps, rubella (MMR) vaccine.** / You need at least 1 dose of MMR if you were born in 1957 or later. You may also need a second dose. For females of childbearing age, rubella immunity should be determined. If there is no evidence of immunity, females who are not pregnant should be vaccinated. If there is no evidence of immunity, females who are pregnant should delay immunization until after pregnancy.  Pneumococcal 13-valent conjugate (PCV13) vaccine.** / Consult your health care provider.  Pneumococcal polysaccharide (PPSV23) vaccine.** / 1 to 2 doses if you smoke cigarettes or if you have certain conditions.  Meningococcal vaccine.** /  Consult your health care provider.  Hepatitis A vaccine.** / Consult your health care provider.  Hepatitis B vaccine.** / Consult your health care provider.  Haemophilus influenzae type b (Hib) vaccine.** / Consult your health care provider. Ages 59 years and over  Blood pressure check.** / Every year.  Lipid and cholesterol check.** / Every 5 years beginning at age 55 years.  Lung cancer screening. / Every year if you  are aged 41-80 years and have a 30-pack-year history of smoking and currently smoke or have quit within the past 15 years. Yearly screening is stopped once you have quit smoking for at least 15 years or develop a health problem that would prevent you from having lung cancer treatment.  Clinical breast exam.** / Every year after age 51 years.  BRCA-related cancer risk assessment.** / For women who have family members with a BRCA-related cancer (breast, ovarian, tubal, or peritoneal cancers).  Mammogram.** / Every year beginning at age 39 years and continuing for as long as you are in good health. Consult with your health care provider.  Pap test.** / Every 3 years starting at age 60 years through age 32 or 57 years with 3 consecutive normal Pap tests. Testing can be stopped between 65 and 70 years with 3 consecutive normal Pap tests and no abnormal Pap or HPV tests in the past 10 years.  HPV screening.** / Every 3 years from ages 50 years through ages 16 or 67 years with a history of 3 consecutive normal Pap tests. Testing can be stopped between 65 and 70 years with 3 consecutive normal Pap tests and no abnormal Pap or HPV tests in the past 10 years.  Fecal occult blood test (FOBT) of stool. / Every year beginning at age 27 years and continuing until age 25 years. You may not need to do this test if you get a colonoscopy every 10 years.  Flexible sigmoidoscopy or colonoscopy.** / Every 5 years for a flexible sigmoidoscopy or every 10 years for a colonoscopy beginning at age 55 years and continuing until age 53 years.  Hepatitis C blood test.** / For all people born from [birth year] through 1965 and any individual with known risks for hepatitis C.  Osteoporosis screening.** / A one-time screening for women ages 38 years and over and women at risk for fractures or osteoporosis.  Skin self-exam. / Monthly.  Influenza vaccine. / Every year.  Tetanus, diphtheria, and acellular pertussis (Tdap/Td)  vaccine.** / 1 dose of Td every 10 years.  Varicella vaccine.** / Consult your health care provider.  Zoster vaccine.** / 1 dose for adults aged 9 years or older.  Pneumococcal 13-valent conjugate (PCV13) vaccine.** / Consult your health care provider.  Pneumococcal polysaccharide (PPSV23) vaccine.** / 1 dose for all adults aged 37 years and older.  Meningococcal vaccine.** / Consult your health care provider.  Hepatitis A vaccine.** / Consult your health care provider.  Hepatitis B vaccine.** / Consult your health care provider.  Haemophilus influenzae type b (Hib) vaccine.** / Consult your health care provider. ** Family history and personal history of risk and conditions may change your health care provider's recommendations.   This information is not intended to replace advice given to you by your health care provider. Make sure you discuss any questions you have with your health care provider.   Document Released: 09/05/2001 Document Revised: 07/31/2014 Document Reviewed: 12/05/2010 Elsevier Interactive Patient Education [employer].

## 2015-09-21 NOTE — Progress Notes (Signed)
Subjective:  Patient ID: Carol Logan, female    DOB: March 15, 1976  Age: 40 y.o. MRN: [id number]  CC: Annual Exam   HPI Carol Logan presents for a CPX - she tells me that has seen her GYN within the last year. She feels well today and offers no complaints.  Outpatient Prescriptions Prior to Visit  Medication Sig Dispense Refill  . norgestimate-ethinyl estradiol (ORTHO-CYCLEN,SPRINTEC,PREVIFEM) 0.25-35 MG-MCG tablet Take 1 tablet by mouth daily. 1 Package 11  . Norgestimate-Ethinyl Estradiol Triphasic 0.18/0.215/0.25 MG-25 MCG tab Take 1 tablet by mouth daily. 3 Package 4   No facility-administered medications prior to visit.    ROS Review of Systems  All other systems reviewed and are negative.   Objective:  BP 110/80 mmHg  Pulse 74  Temp(Src) 98.1 F (36.7 C) (Oral)  Resp 16  Ht 5\' 6"  (1.676 m)  Wt 183 lb (83.008 kg)  BMI 29.55 kg/m2  SpO2 98%  LMP 09/16/2015  BP Readings from Last 3 Encounters:  09/21/15 110/80  06/03/15 130/75  04/13/15 144/125    Wt Readings from Last 3 Encounters:  09/21/15 183 lb (83.008 kg)  06/03/15 184 lb 4.8 oz (83.598 kg)  04/13/15 181 lb (82.101 kg)    Physical Exam  Constitutional: She is oriented to person, place, and time. She appears well-developed and well-nourished. No distress.  HENT:  Mouth/Throat: Oropharynx is clear and moist. No oropharyngeal exudate.  Eyes: Conjunctivae are normal. Right eye exhibits no discharge. Left eye exhibits no discharge. No scleral icterus.  Neck: Normal range of motion. Neck supple. No JVD present. No tracheal deviation present. No thyromegaly present.  Cardiovascular: Normal rate, regular rhythm, normal heart sounds and intact distal pulses.  Exam reveals no gallop and no friction rub.   No murmur heard. Pulmonary/Chest: Effort normal and breath sounds normal. No stridor. No respiratory distress. She has no wheezes. She has no rales. She exhibits no tenderness.  Abdominal: Soft. Bowel  sounds are normal. She exhibits no distension and no mass. There is no tenderness. There is no rebound and no guarding.  Musculoskeletal: Normal range of motion. She exhibits no edema or tenderness.  Lymphadenopathy:    She has no cervical adenopathy.  Neurological: She is oriented to person, place, and time.  Skin: Skin is warm and dry. No rash noted. She is not diaphoretic. No erythema. No pallor.  Psychiatric: She has a normal mood and affect. Her behavior is normal. Judgment and thought content normal.  Vitals reviewed.   Lab Results  Component Value Date   WBC 6.4 09/21/2015   HGB 12.3 09/21/2015   HCT 36.4 09/21/2015   PLT 205.0 09/21/2015   GLUCOSE 92 09/21/2015   CHOL 270* 09/21/2015   TRIG 82.0 09/21/2015   HDL 56.50 09/21/2015   LDLDIRECT 173.0 12/09/2012   LDLCALC 197* 09/21/2015   ALT 12 09/21/2015   AST 16 09/21/2015   NA 139 09/21/2015   K 5.1 09/21/2015   CL 107 09/21/2015   CREATININE 0.92 09/21/2015   BUN 16 09/21/2015   CO2 28 09/21/2015   TSH 0.38 09/21/2015    No results found.  Assessment & Plan:   Carol Logan was seen today for annual exam.  Diagnoses and all orders for this visit:  Routine general medical examination at a health care facility- she refused a flu vax today, she tells me that her PAP is UTD, she has a mammogram soon, exam done, labs ordered and reviewed -     Lipid  panel; Future -     Comprehensive metabolic panel; Future -     CBC with Differential/Platelet; Future -     TSH; Future -     hCG, quantitative, pregnancy; Future  Hyperlipidemia - she is at risk for pregnancy and has a low Framingham risk score so I do not recommend a statin at this time   I have discontinued Carol Logan's Norgestimate-Ethinyl Estradiol Triphasic. I am also having her maintain her norgestimate-ethinyl estradiol.  No orders of the defined types were placed in this encounter.     Follow-up: Return if symptoms worsen or fail to improve.  Carol Calico, MD

## 2015-09-30 ENCOUNTER — Ambulatory Visit: Payer: Self-pay

## 2015-10-19 ENCOUNTER — Other Ambulatory Visit: Payer: Self-pay | Admitting: Internal Medicine

## 2015-10-19 DIAGNOSIS — Z1231 Encounter for screening mammogram for malignant neoplasm of breast: Secondary | ICD-10-CM

## 2016-04-19 ENCOUNTER — Encounter: Payer: Self-pay | Admitting: *Deleted

## 2016-05-02 ENCOUNTER — Encounter: Payer: Self-pay | Admitting: Family Medicine

## 2016-05-02 ENCOUNTER — Encounter: Payer: 59 | Admitting: Family Medicine

## 2016-05-02 NOTE — Progress Notes (Signed)
   Patient presented for an annual physical exam. Confirmed with patient when she presented that she was here for her annual physical. Took 15 minutes obtaining information from patient and HPI. Upon Epic chart review, inquired as to why she is getting an annual physical after she had one in February at [facility]. Patient became upset saying she thought this was her "OB physical". She stated she wanted to leave. Explained to patient we could still do her pap smear and breast exam like a gynecological physical we would just change the appointment type. Patient stated she did not want this because then her insurance would charge her. She gathered her belongings and stormed out of exam room.   Smitty Cords, MD [facility], PGY-2

## 2016-05-08 ENCOUNTER — Telehealth: Payer: Self-pay | Admitting: *Deleted

## 2016-05-08 NOTE — Telephone Encounter (Signed)
Patient had recent office visit for Pap/GYN visit.  Unable to be seen for visit due to provider schedule 1 hour behind, and she has to return to work.  Apologized to patient and rescheduled appt to 06/19/16.  Service recovery gift card ([employer]) and letter mailed to patient.    Burna Forts, BSN, RN-BC

## 2016-06-19 ENCOUNTER — Ambulatory Visit: Payer: 59 | Admitting: Family Medicine

## 2016-06-19 NOTE — Progress Notes (Deleted)
   Subjective:    Patient ID: Carol Logan , female   DOB: 05/26/1976 , 40 y.o..   MRN: [id number]  HPI  ARNETT CARDY is here for ***.     Review of Systems: Per HPI. All other systems reviewed and are negative.  Health Maintenance Due  Topic Date Due  . PAP SMEAR  12/03/2015    Past Medical History: Patient Active Problem List   Diagnosis Date Noted  . Routine general medical examination at a health care facility 09/21/2015  . Hyperlipidemia 06/22/2014  . Obesity 06/17/2014  . Fibroid uterus 06/17/2014  . Allergic rhinitis 06/17/2014    Medications: reviewed and updated Current Outpatient Prescriptions  Medication Sig Dispense Refill  . norgestimate-ethinyl estradiol (ORTHO-CYCLEN,SPRINTEC,PREVIFEM) 0.25-35 MG-MCG tablet Take 1 tablet by mouth daily. 1 Package 11   No current facility-administered medications for this visit.     Social Hx:  reports that she has never smoked. She has never used smokeless tobacco.   Objective:   There were no vitals taken for this visit. Physical Exam  Gen: NAD, alert, cooperative with exam, well-appearing HEENT: NCAT, PERRL, clear conjunctiva, oropharynx clear, supple neck Cardiac: Regular rate and rhythm, normal S1/S2, no murmur, no edema, capillary refill brisk  Respiratory: Clear to auscultation bilaterally, no wheezes, non-labored breathing Gastrointestinal: soft, non tender, non distended, bowel sounds present Skin: no rashes, normal turgor  Neurological: no gross deficits.  Psych: good insight, normal mood and affect   Assessment & Plan:  No problem-specific Assessment & Plan notes found for this encounter.   Smitty Cords, MD [facility], PGY-2

## 2016-09-11 DIAGNOSIS — N920 Excessive and frequent menstruation with regular cycle: Secondary | ICD-10-CM | POA: Insufficient documentation

## 2016-09-13 DIAGNOSIS — B009 Herpesviral infection, unspecified: Secondary | ICD-10-CM | POA: Insufficient documentation

## 2016-09-21 ENCOUNTER — Other Ambulatory Visit: Payer: Self-pay | Admitting: Obstetrics and Gynecology

## 2016-11-24 DIAGNOSIS — M79602 Pain in left arm: Secondary | ICD-10-CM | POA: Diagnosis not present

## 2016-11-24 DIAGNOSIS — Z79899 Other long term (current) drug therapy: Secondary | ICD-10-CM | POA: Diagnosis not present

## 2016-11-24 DIAGNOSIS — M25512 Pain in left shoulder: Secondary | ICD-10-CM | POA: Diagnosis present

## 2016-11-25 ENCOUNTER — Emergency Department (HOSPITAL_COMMUNITY)
Admission: EM | Admit: 2016-11-25 | Discharge: 2016-11-25 | Disposition: A | Discharge status: Home / Self Care | Payer: 59 | Attending: Emergency Medicine | Admitting: Emergency Medicine

## 2016-11-25 ENCOUNTER — Encounter (HOSPITAL_COMMUNITY): Payer: Self-pay | Admitting: Nurse Practitioner

## 2016-11-25 DIAGNOSIS — M79602 Pain in left arm: Secondary | ICD-10-CM

## 2016-11-25 MED ORDER — CYCLOBENZAPRINE HCL 10 MG PO TABS
10.0000 mg | ORAL_TABLET | Freq: Two times a day (BID) | ORAL | 0 refills | Status: DC | PRN
Start: 2016-11-25 — End: 2017-05-14

## 2016-11-25 NOTE — ED Provider Notes (Signed)
[facility] DEPT Provider Note   CSN: [id number] Arrival date & time: 11/24/16  2330     History   Chief Complaint Chief Complaint  Patient presents with  . Arm Pain    HPI Carol Logan is a 41 y.o. female.  Patient presents to the emergency department with chief complaint left shoulder pain. She states that she has been having the pain for the past 2 weeks. She denies any significant injury. 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 She states that the pain radiates from her left shoulder down her arm. She denies any numbness, weakness, or tingling. She states that she has pain when she attempts to abduct her arm.  She has tried taking ibuprofen with mild relief. She has not seen any one for these symptoms.    The history is provided by the patient. No language interpreter was used.    Past Medical History:  Diagnosis Date  . Fibroids   . GERD (gastroesophageal reflux disease)   . Headache(784.0)   . No pertinent past medical history   . Ovarian cyst     Patient Active Problem List   Diagnosis Date Noted  . Routine general medical examination at a health care facility 09/21/2015  . Hyperlipidemia 06/22/2014  . Obesity 06/17/2014  . Fibroid uterus 06/17/2014  . Allergic rhinitis 06/17/2014    Past Surgical History:  Procedure Laterality Date  . CERVICAL CERCLAGE N/A 04/01/2013   Procedure: CERCLAGE CERVICAL;  Surgeon: [name], MD;  Location: [facility];  Service: Gynecology;  Laterality: N/A;  . CESAREAN SECTION N/A 09/18/2013   Procedure: CESAREAN SECTION;  Surgeon: [name], MD;  Location: [facility];  Service: Obstetrics;  Laterality: N/A;  . FOOT SURGERY    . FRACTURE SURGERY    . MYOMECTOMY  10/11/2011   Procedure: MYOMECTOMY;  Surgeon: [name], MD;  Location: [facility];  Service: Gynecology;  Laterality: N/A;    OB History    Gravida Para Term Preterm AB Living   4 2 2  0 2 2   SAB TAB Ectopic Multiple Live Births   0 2  0 0 2       Home Medications    Prior to Admission medications   Medication Sig Start Date End Date Taking? Authorizing Provider  cyclobenzaprine (FLEXERIL) 10 MG tablet Take 1 tablet (10 mg total) by mouth 2 (two) times daily as needed for muscle spasms. 11/25/16   Montine Circle, PA-C  norgestimate-ethinyl estradiol (ORTHO-CYCLEN,SPRINTEC,PREVIFEM) 0.25-35 MG-MCG tablet Take 1 tablet by mouth daily. 06/03/15   Constant, Peggy, MD    Family History Family History  Problem Relation Age of Onset  . High blood pressure Mother   . Glaucoma Paternal Uncle   . Alzheimer's disease Paternal Uncle   . Cancer Neg Hx   . Depression Neg Hx   . Diabetes Neg Hx   . Drug abuse Neg Hx   . Early death Neg Hx   . Alcohol abuse Neg Hx   . Heart disease Neg Hx   . Hyperlipidemia Neg Hx   . Hypertension Neg Hx   . Kidney disease Neg Hx   . Stroke Neg Hx     Social History Social History  Substance Use Topics  . Smoking status: Never Smoker  . Smokeless tobacco: Never Used  . Alcohol use No     Comment: socially- 1 glass of wine monthly     Allergies   Patient has no  known allergies.   Review of Systems Review of Systems  All other systems reviewed and are negative.    Physical Exam Updated Vital Signs BP (!) 124/94 (BP Location: Left Arm)   Pulse 87   Temp 98.8 F (37.1 C) (Oral)   Resp 18   Ht 5\' 5"  (1.651 m)   Wt 80.3 kg   SpO2 97%   BMI 29.45 kg/m   Physical Exam Nursing note and vitals reviewed.  Constitutional: Pt appears well-developed and well-nourished. No distress.  HENT:  Head: Normocephalic and atraumatic.  Eyes: Conjunctivae are normal.  Neck: Normal range of motion.  Cardiovascular: Normal rate, regular rhythm. Intact distal pulses.   Capillary refill < 3 sec.  Pulmonary/Chest: Effort normal and breath sounds normal.  Musculoskeletal:  Left upper extremity Pt exhibits no tenderness to palpation, no bony abnormality or deformity of the left  shoulder, there is tenderness to palpation of the left upper trapezius muscle body, positive Hawkins-Kennedy test, negative empty can test.   ROM: 4/5  Strength: 5/5  Neurological: Pt  is alert. Coordination normal.  Sensation: 5/5  Skin: Skin is warm and dry. Pt is not diaphoretic.  No evidence of open wound or skin tenting Psychiatric: Pt has a normal mood and affect.     ED Treatments / Results  Labs (all labs ordered are listed, but only abnormal results are displayed) Labs Reviewed - No data to display  EKG  EKG Interpretation None       Radiology No results found.  Procedures Procedures (including critical care time)  Medications Ordered in ED Medications - No data to display   Initial Impression / Assessment and Plan / ED Course  I have reviewed the triage vital signs and the nursing notes.  Pertinent labs & imaging results that were available during my care of the patient were reviewed by me and considered in my medical decision making (see chart for details).     Physical exam negative for obvious fracture or dislocation, doubt utility of XR imaging.  Symptoms seem consistent with impingement syndrome.  Pt advised to follow up with orthopedics. Patient given sling while in ED, conservative therapy recommended and discussed. Patient will be discharged home & is agreeable with above plan. Returns precautions discussed. Pt appears safe for discharge.   Final Clinical Impressions(s) / ED Diagnoses   Final diagnoses:  Left arm pain    New Prescriptions New Prescriptions   CYCLOBENZAPRINE (FLEXERIL) 10 MG TABLET    Take 1 tablet (10 mg total) by mouth 2 (two) times daily as needed for muscle spasms.     Montine Circle, PA-C 11/25/16 0177    Ripley Fraise, MD 11/25/16 2312

## 2016-11-25 NOTE — ED Triage Notes (Signed)
Patient states "about 2 weeks ago I started having aching feeling in my left arm after she woke up. Not sure if I slept wrong because my neck was hurting also. The neck pain has went away but the arm pain continues to nag. It is uncomfortable to lift my arm up, fasten my bra, and carry anything heavy with that arm." Patient denies any shortness of breath, back pain, chest pain, or urinary symptoms.

## 2016-11-25 NOTE — ED Notes (Signed)
Patient is alert and oriented x3.  She was given DC instructions and follow up visit instructions.  Patient gave verbal understanding. She was DC ambulatory under her own power to home.  V/S stable.  He was not showing any signs of distress on DC 

## 2017-05-09 ENCOUNTER — Encounter: Payer: Self-pay | Admitting: Family Medicine

## 2017-05-09 ENCOUNTER — Ambulatory Visit (INDEPENDENT_AMBULATORY_CARE_PROVIDER_SITE_OTHER): Payer: 59 | Admitting: Family Medicine

## 2017-05-09 VITALS — BP 120/80 | HR 85 | Temp 98.5°F | Wt 166.0 lb

## 2017-05-09 DIAGNOSIS — N920 Excessive and frequent menstruation with regular cycle: Secondary | ICD-10-CM

## 2017-05-09 NOTE — Progress Notes (Signed)
   Subjective:   Patient ID: Carol Logan    DOB: [birth year], 41 y.o. female   MRN: [id number]  Carol Logan is a 41 y.o. female with a history of menorrhagia, uterine fibroids here for   Menorrhagia - Previously evaluated in 2016 by OB/GYN - "Discussed medical management with progesterone containing IUD vs surgical management with endometrial ablation before considering hysterectomy which is a major surgery. Patient opted to think about it but is not interested in trying a different form of birth control. Patient will return when ready for an intervention. RTC prn. Patient will need an endometrial biopsy prior to scheduling the endometrial ablation if interested" - Had myomectomy 2013 - Tried depo shot, had vaginal bleeding every day for 3 months - Takes ibuprofen q4h for cramping and bleeding, also has vomiting, nauseous - Periods last 10 days, 6-7 out of 10 are heavy. Uses both pads and super tampons and has to change every hour. Has cramping 2-3 days before, and is exhausted. Missing work a few days a month due to symptoms - Mom had similar course - Hasn't had endometrial ablation, is done having kids, wants something permanent. Last child 2015, no other pregnancies, tubes tied. - Hasn't tried IUD, not interested. Has tried shots and pills.  Review of Systems:  Per HPI.   [place]: reviewed. Smoking status reviewed. Medications reviewed.  Objective:   BP 120/80   Pulse 85   Temp 98.5 F (36.9 C) (Oral)   Wt 166 lb (75.3 kg)   LMP 04/30/2017   SpO2 99%   BMI 27.62 kg/m  Vitals and nursing note reviewed.  General: well nourished, well developed, in no acute distress with non-toxic appearance HEENT: normocephalic, atraumatic, moist mucous membranes Neck: supple, non-tender without lymphadenopathy CV: regular rate and rhythm without murmurs, rubs, or gallops Lungs: clear to auscultation bilaterally with normal work of breathing Skin: warm, dry, no rashes or  lesions Extremities: warm and well perfused, normal tone MSK: ROM grossly intact, strength intact, gait normal Neuro: Alert and oriented, speech normal  Assessment & Plan:   Menorrhagia Chronic in nature. Periods last 10 days, 6-7 out of 10 are heavy. Uses both pads and super tampons and has to change every hour. Missing work a few days a month due to symptoms. Myomectomy 2013. Last OB/GYN note mentions IUD vs ablation and hysterectomy. Patient wants permanent fix and amenable to hysterectomy. - Referral to OB/GYN - CBC to assess for anemia  Orders Placed This Encounter  Procedures  . CBC  . Ambulatory referral to Obstetrics / Gynecology    Referral Priority:   Routine    Referral Type:   Consultation    Referral Reason:   Specialty Services Required    Requested Specialty:   Obstetrics and Gynecology    Number of Visits Requested:   1   Meds ordered this encounter  Medications  . ibuprofen (ADVIL,MOTRIN) 600 MG tablet    Sig: ibuprofen 600 mg tablet  TK 1 T PO  Q 6 H. TK AT THE ONSET OF MENSES.    Rory Percy, DO PGY-1, [facility] 05/09/2017 3:12 PM

## 2017-05-09 NOTE — Assessment & Plan Note (Signed)
Chronic in nature. Periods last 10 days, 6-7 out of 10 are heavy. Uses both pads and super tampons and has to change every hour. Missing work a few days a month due to symptoms. Myomectomy 2013. Last OB/GYN note mentions IUD vs ablation and hysterectomy. Patient wants permanent fix and amenable to hysterectomy. - Referral to OB/GYN - CBC to assess for anemia

## 2017-05-09 NOTE — Patient Instructions (Addendum)
It was great to see you!  For your heavy menstrual bleeding,  - We are referring you to OB/GYN for further evaluation and possible hysterectomy. - Continue taking Ibuprofen as needed for cramping and bleeding. - We are checking some labs today, we will call you or send you a letter if they are abnormal.   Take care and seek immediate care sooner if you develop any concerns.   Rory Percy, DO [facility]

## 2017-05-10 ENCOUNTER — Other Ambulatory Visit: Payer: Self-pay | Admitting: Family Medicine

## 2017-05-10 DIAGNOSIS — D649 Anemia, unspecified: Secondary | ICD-10-CM

## 2017-05-10 LAB — CBC
HEMATOCRIT: 31.1 % — AB (ref 34.0–46.6)
HEMOGLOBIN: 10 g/dL — AB (ref 11.1–15.9)
MCH: 24.7 pg — ABNORMAL LOW (ref 26.6–33.0)
MCHC: 32.2 g/dL (ref 31.5–35.7)
MCV: 77 fL — ABNORMAL LOW (ref 79–97)
Platelets: 253 10*3/uL (ref 150–379)
RBC: 4.05 x10E6/uL (ref 3.77–5.28)
RDW: 16.3 % — AB (ref 12.3–15.4)
WBC: 6.1 10*3/uL (ref 3.4–10.8)

## 2017-05-10 MED ORDER — FERROUS SULFATE DRIED ER 160 (50 FE) MG PO TBCR: 1.0000 | EXTENDED_RELEASE_TABLET | Freq: Every day | ORAL | 3 refills | Status: DC

## 2017-05-10 NOTE — Progress Notes (Signed)
Informed patient of lab results - slightly anemic with Hb 10.0, MCV 77. Most likely iron deficiency anemia due to blood loss from menorrhagia. Expect this to resolve with hysterectomy. Ordered Iron panel to confirm and prescribed iron supplement to patient's pharmacy. Patient verbalized understanding and questions were answered.  Rory Percy, DO PGY-1, [facility] 05/10/2017 10:30 AM

## 2017-05-14 ENCOUNTER — Other Ambulatory Visit: Payer: 59

## 2017-05-14 ENCOUNTER — Ambulatory Visit (INDEPENDENT_AMBULATORY_CARE_PROVIDER_SITE_OTHER): Payer: 59 | Admitting: Obstetrics and Gynecology

## 2017-05-14 ENCOUNTER — Encounter: Payer: Self-pay | Admitting: Obstetrics and Gynecology

## 2017-05-14 VITALS — BP 122/70 | HR 64 | Resp 16 | Ht 65.0 in | Wt 167.4 lb

## 2017-05-14 DIAGNOSIS — D649 Anemia, unspecified: Secondary | ICD-10-CM

## 2017-05-14 DIAGNOSIS — D219 Benign neoplasm of connective and other soft tissue, unspecified: Secondary | ICD-10-CM

## 2017-05-14 DIAGNOSIS — N920 Excessive and frequent menstruation with regular cycle: Secondary | ICD-10-CM | POA: Diagnosis not present

## 2017-05-14 DIAGNOSIS — N946 Dysmenorrhea, unspecified: Secondary | ICD-10-CM

## 2017-05-14 NOTE — Progress Notes (Signed)
41 y.o. [id number] Single African American female here for heavy/painful menses.  Patient has a history of fibroids/myomectomy and endometriosis.  Patient wants a permanent solution for her bleeding and pain.   Menses every 20 - 25 days and last for 10 days.  Menses are painful.  Has nausea and vomiting.  Has contractions for the first 2 days.  Has clotting and changing 2 pads and a tampon every hour. Is anemic.  Hgb 10.0 - 05/09/17.  Now on Slo Fe.  Tried Depo Provera and had continuous cycle for 3 months.  OCPs did not help also.   Benign EMB in March 2018 with Dr. Leo Grosser.   Has watery discharge halfway between one menses and then next.  This goes away.   Denies urinary incontinence.   Desires hysterectomy surgery. Declines future childbearing.   Works for [employer].  Prior GYN:  Dr. Ruthann Cancer.   PCP: [facility]     Patient's last menstrual period was 04/30/2017 (exact date).           Sexually active: Yes.   female The current method of family planning is tubal ligation.    Exercising: No.   Smoker:  no  Health Maintenance: Pap:  ?09/2016 per patient, 12-02-13 normal per pt. History of abnormal Pap:  Yes, hx cryotherapy to cervix in her 23s. Paps normal since. MMG: 09/2016 with [facility]--normal per patient  Colonoscopy:  n/a BMD:   n/a  Result  n/a TDaP:  Within past 8-10 years Gardasil:   no HIV:Neg per patient Hep C:Neg per patient Screening Labs:  ----   reports that she has never smoked. She has never used smokeless tobacco. She reports that she does not drink alcohol or use drugs.  Past Medical History:  Diagnosis Date  . Abnormal uterine bleeding   . Anemia   . Endometriosis   . Fibroid   . Fibroids   . GERD (gastroesophageal reflux disease)   . Headache(784.0)   . No pertinent past medical history   . Ovarian cyst     Past Surgical History:  Procedure Laterality Date  . CERVICAL CERCLAGE N/A 04/01/2013   Procedure:  CERCLAGE CERVICAL;  Surgeon: [name], MD;  Location: [facility];  Service: Gynecology;  Laterality: N/A;  . CESAREAN SECTION N/A 09/18/2013   Procedure: CESAREAN SECTION;  Surgeon: [name], MD;  Location: [facility];  Service: Obstetrics;  Laterality: N/A;  . FOOT SURGERY    . FRACTURE SURGERY    . MYOMECTOMY  10/11/2011   Procedure: MYOMECTOMY;  Surgeon: [name], MD;  Location: [facility];  Service: Gynecology;  Laterality: N/A;  . TUBAL LIGATION      Current Outpatient Prescriptions  Medication Sig Dispense Refill  . ferrous sulfate (SLOW FE) 160 (50 Fe) MG TBCR SR tablet Take 1 tablet (160 mg total) by mouth daily. 90 tablet 3  . ibuprofen (ADVIL,MOTRIN) 600 MG tablet ibuprofen 600 mg tablet  TK 1 T PO  Q 6 H. TK AT THE ONSET OF MENSES.     No current facility-administered medications for this visit.     Family History  Problem Relation Age of Onset  . High blood pressure Mother   . Hypertension Mother   . Glaucoma Paternal Uncle   . Alzheimer's disease Paternal Uncle   . Hypertension Sister   . Other Sister        GYN complications  . Other Brother  Homicide  . Hypertension Maternal Grandmother   . Heart disease Maternal Grandmother   . Hypertension Paternal Grandmother   . Stroke Paternal Grandmother   . Stroke Paternal Grandfather   . Ovarian cancer Other   . Cancer Neg Hx   . Depression Neg Hx   . Diabetes Neg Hx   . Drug abuse Neg Hx   . Early death Neg Hx   . Alcohol abuse Neg Hx   . Hyperlipidemia Neg Hx   . Kidney disease Neg Hx     ROS:  Pertinent items are noted in HPI.  Otherwise, a comprehensive ROS was negative.  Exam:   BP 122/70 (BP Location: Right Arm, Patient Position: Sitting, Cuff Size: Normal)   Pulse 64   Resp 16   Ht 5\' 5"  (1.651 m)   Wt 167 lb 6.4 oz (75.9 kg)   LMP 04/30/2017 (Exact Date)   BMI 27.86 kg/m     General appearance: alert, cooperative and appears stated age Head: Normocephalic, without obvious  abnormality, atraumatic Neck: no adenopathy, supple, symmetrical, trachea midline and thyroid normal to inspection and palpation Lungs: clear to auscultation bilaterally Heart: regular rate and rhythm Abdomen: soft, non-tender; no masses, no organomegaly Extremities: extremities normal, atraumatic, no cyanosis or edema Skin: Skin color, texture, turgor normal. No rashes or lesions No abnormal inguinal nodes palpated Neurologic: Grossly normal  Pelvic: External genitalia:  no lesions              Urethra:  normal appearing urethra with no masses, tenderness or lesions              Bartholins and Skenes: normal                 Vagina: normal appearing vagina with normal color and discharge, no lesions              Cervix: no lesions.  Watery discharge in vagina.              Bimanual Exam:  Uterus:  6 week size and nontender.               Adnexa: no mass, fullness, tenderness              Rectal exam: Yes.  .  Confirms.              Anus:  normal sphincter tone, no lesions  Chaperone was present for exam.  Assessment:     Menorrhagia.  Fibroids.  Dysmenorrhea.  Hx endometriosis.  Hx myomectomy, C/S, and tubal ligation.  Anemia.   Plan:  We had a discussion regarding endometriosis, dysmenorrhea, fibroids, and anemia.  I offered solutions including a Mirena IUD, Depo Lupron, uterine artery embolization or ablation to treat the bleeding, and we discussed laparoscopic hysterectomy with bilateral salpingectomy and possible bilateral oophorectomy if extensive endometriosis were encountered.  We discussed surgical risks of bleeding, infection, damage to surrounding organs, reaction to anesthesia, pneumonia, DVT, PE, death, and need for reoperation.  She is now starting iron therapy.  She will return for a pelvic ultrasound and potential surgical planning.  ACOG HO on hysterectomy.   __45_____ minutes face to face time of which over 50% was spent in counseling.       After visit  summary provided.

## 2017-05-15 ENCOUNTER — Telehealth: Payer: Self-pay | Admitting: *Deleted

## 2017-05-15 LAB — IRON AND TIBC
IRON SATURATION: 15 % (ref 15–55)
Iron: 47 ug/dL (ref 27–159)
Total Iron Binding Capacity: 320 ug/dL (ref 250–450)
UIBC: 273 ug/dL (ref 131–425)

## 2017-05-15 LAB — FERRITIN: Ferritin: 25 ng/mL (ref 15–150)

## 2017-05-15 NOTE — Telephone Encounter (Signed)
Patient informed of results and she voiced understanding. Carol Logan,CMA

## 2017-05-15 NOTE — Telephone Encounter (Signed)
-----   Message from Rory Percy, DO sent at 05/15/2017  3:21 PM EDT ----- Please let patient know iron studies were normal. She should continue taking iron supplements until evaluated by OB/GYN and has hysterectomy.    Thanks!

## 2017-05-24 ENCOUNTER — Encounter: Payer: Self-pay | Admitting: Obstetrics and Gynecology

## 2017-05-24 ENCOUNTER — Ambulatory Visit (INDEPENDENT_AMBULATORY_CARE_PROVIDER_SITE_OTHER): Payer: 59

## 2017-05-24 ENCOUNTER — Ambulatory Visit (INDEPENDENT_AMBULATORY_CARE_PROVIDER_SITE_OTHER): Payer: 59 | Admitting: Obstetrics and Gynecology

## 2017-05-24 VITALS — BP 122/78 | HR 70 | Ht 65.0 in | Wt 167.0 lb

## 2017-05-24 DIAGNOSIS — N920 Excessive and frequent menstruation with regular cycle: Secondary | ICD-10-CM | POA: Diagnosis not present

## 2017-05-24 DIAGNOSIS — D219 Benign neoplasm of connective and other soft tissue, unspecified: Secondary | ICD-10-CM

## 2017-05-24 DIAGNOSIS — D649 Anemia, unspecified: Secondary | ICD-10-CM | POA: Diagnosis not present

## 2017-05-24 MED ORDER — LEVONORGEST-ETH ESTRAD 91-DAY 0.15-0.03 MG PO TABS: 1.0000 | ORAL_TABLET | Freq: Every day | ORAL | 1 refills | Status: DC

## 2017-05-24 NOTE — Progress Notes (Signed)
Encounter reviewed by Dr. Brook Amundson C. Silva.  

## 2017-05-24 NOTE — Progress Notes (Signed)
Patient ID: Carol Logan, female   DOB: Dec 22, 1975, 41 y.o.   MRN: [id number] GYNECOLOGY  VISIT   HPI: 41 y.o.   Single  African American  female   [id number] with Patient's last menstrual period was 04/30/2017 (exact date).   here for pelvic ultrasound for menorrhagia, dysmenorrhea and fibroids.  Has anemia.  Hgb 10.0 on 05/09/17.  Had EMB with Dr. Leo Grosser in March 2018 - benign.   Tried pills and Depo Provera in the past.  Had irregular cycles with Depo Provera.  Hoping for hysterectomy this year.   Not a smoker.  Denies hx of HTN, liver disease, migraine with aura, hx DVT/PE.  GYNECOLOGIC HISTORY: Patient's last menstrual period was 04/30/2017 (exact date). Contraception:  Tubal Menopausal hormone therapy:  none Last mammogram: 09/2016 with [facility]--normal per patient  Last pap smear: ? 09/2016 per patient, 12-02-13 normal per pt.        OB History    Gravida Para Term Preterm AB Living   4 2 2  0 2 2   SAB TAB Ectopic Multiple Live Births   0 2 0 0 2         Patient Active Problem List   Diagnosis Date Noted  . 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 Menorrhagia 09/11/2016  . Routine general medical examination at a health care facility 09/21/2015  . Hyperlipidemia 06/22/2014  . Obesity 06/17/2014  . Uterine leiomyoma 06/17/2014  . Allergic rhinitis 06/17/2014    Past Medical History:  Diagnosis Date  . Abnormal uterine bleeding   . Anemia   . Endometriosis   . Fibroid   . Fibroids   . GERD (gastroesophageal reflux disease)   . Headache(784.0)   . No pertinent past medical history   . Ovarian cyst     Past Surgical History:  Procedure Laterality Date  . CERVICAL CERCLAGE N/A 04/01/2013   Procedure: CERCLAGE CERVICAL;  Surgeon: [name], MD;  Location: [facility];  Service: Gynecology;  Laterality: N/A;  . CESAREAN SECTION N/A 09/18/2013   Procedure: CESAREAN SECTION;  Surgeon:  [name], MD;  Location: [facility];  Service: Obstetrics;  Laterality: N/A;  . FOOT SURGERY    . FRACTURE SURGERY    . MYOMECTOMY  10/11/2011   Procedure: MYOMECTOMY;  Surgeon: [name], MD;  Location: [facility];  Service: Gynecology;  Laterality: N/A;  . TUBAL LIGATION      Current Outpatient Prescriptions  Medication Sig Dispense Refill  . ferrous sulfate (SLOW FE) 160 (50 Fe) MG TBCR SR tablet Take 1 tablet (160 mg total) by mouth daily. 90 tablet 3  . ibuprofen (ADVIL,MOTRIN) 600 MG tablet ibuprofen 600 mg tablet  TK 1 T PO  Q 6 H. TK AT THE ONSET OF MENSES.     No current facility-administered medications for this visit.      ALLERGIES: Patient has no known allergies.  Family History  Problem Relation Age of Onset  . High blood pressure Mother   . Hypertension Mother   . Glaucoma Paternal Uncle   . Alzheimer's disease Paternal Uncle   . Hypertension Sister   . Other Sister        GYN complications  . Other Brother        Homicide  . Hypertension Maternal Grandmother   . Heart disease Maternal Grandmother   . Hypertension Paternal Grandmother   . Stroke Paternal Grandmother   .  Stroke Paternal Grandfather   . Ovarian cancer Other   . Cancer Neg Hx   . Depression Neg Hx   . Diabetes Neg Hx   . Drug abuse Neg Hx   . Early death Neg Hx   . Alcohol abuse Neg Hx   . Hyperlipidemia Neg Hx   . Kidney disease Neg Hx     Social History   Social History  . Marital status: Single    Spouse name: N/A  . Number of children: 1  . Years of education: college   Occupational History  . [employer]   Social History Main Topics  . Smoking status: Never Smoker  . Smokeless tobacco: Never Used  . Alcohol use No     Comment: socially- 1 glass of wine monthly  . Drug use: No  . Sexual activity: Yes    Partners: Male    Birth control/ protection: Surgical     Comment: tubal   Other Topics Concern  . Not on file   Social  History Narrative   Pt lives at home with her spouse and son.   Caffeine Use- Consumes coffee/tea twice a week    ROS:  Pertinent items are noted in HPI.  PHYSICAL EXAMINATION:    BP 122/78 (BP Location: Right Arm, Patient Position: Sitting, Cuff Size: Normal)   Pulse 70   Ht 5\' 5"  (1.651 m)   Wt 167 lb (75.8 kg)   LMP 04/30/2017 (Exact Date)   BMI 27.79 kg/m     General appearance: alert, cooperative and appears stated age   Pelvic ultrasound:  Uterus with 9 fibroids, largest is 18 mm.  EMS 6.69 mm.  Ovaries normal.  Left ovary with 14 mm CL.  No free fluid.   ASSESSMENT  Menorrhagia.  Fibroids.  Dysmenorrhea.  Hx endometriosis.  Hx myomectomy, C/S, cervical cerclage, and tubal ligation.  Anemia.   PLAN  We reviewed options for care for menorrhagia, fibroids, and dysmenorrhea - pills, Mirena IUD, Depo Provera, Nexplanon, uterine artery embolization or endometrial ablation (for bleeding), and laparoscopic hysterectomy with bilateral salpingectomy, cystoscopy with possible bilateral oophorectomy.  She desires hysterectomy.  We discussed menopausal symptoms and tx with ERT if the ovaries are removed.  We discussed the possibility of laparotomy.   The patient would like to start OCPs while we are waiting to do her hysterectomy.   We will use Seasonale to reduce her bleeding and help her anemia.  I gave her one pack and one refill.   She will need to return for a preop visit.   She has received ACOG HO on hysterectomy and has no further questions at this time.    An After Visit Summary was printed and given to the patient.  __15____ minutes face to face time of which over 50% was spent in counseling.

## 2017-05-28 ENCOUNTER — Telehealth: Payer: Self-pay | Admitting: *Deleted

## 2017-05-28 NOTE — Telephone Encounter (Signed)
Call to patient to review surgery date options.  Patient hopeful to proceed this year; advised there are limited options available and these were discussed. Patient is agreeable to 11-19 or 07-03-17 but would prefer 07-03-17. Advised will need to confirm availability and call back.

## 2017-05-29 NOTE — Telephone Encounter (Signed)
Return call to patient. Voice mail has number confirmation. Per ROI, can leave message on voice mail.  Left message that only remaining date option for surgery in 2018 is 06-11-17. There will be demand for this date so need to know ASAP if she would like to proceed. Left message to call back.

## 2017-05-29 NOTE — Telephone Encounter (Signed)
Thank you for the update!

## 2017-05-29 NOTE — Telephone Encounter (Signed)
Patient called and would like to know if the December date is available for surgery.

## 2017-05-30 NOTE — Telephone Encounter (Signed)
Patient called and left a message after hours returning [place] call. She did not leave any other details.

## 2017-05-30 NOTE — Telephone Encounter (Signed)
Spoke with patient regarding benefit for recommendedsurgery. Patient understood and agreeable. Patient would like to schedule surgery on 06/11/17, states she will call back to our office this afternoon to confirm. Patient aware this is professional benefit only. Patient aware will be contacted by hospital for separate benefits.    cc: Lamont Snowball, RN

## 2017-05-30 NOTE — Telephone Encounter (Signed)
Patient pais surgery deposit and is ready to proceed with scheduling her surgery.

## 2017-05-30 NOTE — Telephone Encounter (Signed)
Patient would like to proceed with surgery on 06/11/2017.

## 2017-05-30 NOTE — Telephone Encounter (Signed)
Left message to call Verline Lema or Gay Filler at [phone].

## 2017-05-31 NOTE — Telephone Encounter (Signed)
Return call to patient. Advised surgery is confirmed for 06-11-17 at 36 at [facility].  Surgery instruction sheet reviewed and printed copy will be provided with hospital surgery info at consult appointment on 06-05-17.  Routing to provider for final review. Patient agreeable to disposition. Will close encounter.

## 2017-06-05 ENCOUNTER — Encounter: Payer: Self-pay | Admitting: Obstetrics and Gynecology

## 2017-06-05 ENCOUNTER — Ambulatory Visit (INDEPENDENT_AMBULATORY_CARE_PROVIDER_SITE_OTHER): Payer: 59 | Admitting: Obstetrics and Gynecology

## 2017-06-05 ENCOUNTER — Telehealth: Payer: Self-pay | Admitting: Obstetrics and Gynecology

## 2017-06-05 ENCOUNTER — Other Ambulatory Visit: Payer: Self-pay

## 2017-06-05 VITALS — BP 114/70 | HR 72 | Resp 16 | Wt 168.0 lb

## 2017-06-05 DIAGNOSIS — D649 Anemia, unspecified: Secondary | ICD-10-CM

## 2017-06-05 DIAGNOSIS — N946 Dysmenorrhea, unspecified: Secondary | ICD-10-CM

## 2017-06-05 DIAGNOSIS — D219 Benign neoplasm of connective and other soft tissue, unspecified: Secondary | ICD-10-CM

## 2017-06-05 DIAGNOSIS — Z0289 Encounter for other administrative examinations: Secondary | ICD-10-CM

## 2017-06-05 NOTE — Patient Instructions (Addendum)
Your procedure is scheduled on: Monday November 19, 201/8 at 7:30 am  Enter through the Main Entrance of [facility] at: 6:00 am  Pick up the phone at the desk and dial [phone].  Call this number if you have problems the morning of surgery: [phone].  Remember: Do NOT eat food or drink any liquids after:  Midnight on Sunday November 18   Take these medicines the morning of surgery with a SIP OF WATER: None  Bring inhaler with you on day of surgery.  Do Not smoke on the day of surgery.  Stop herbal medications and supplements at this time.  Do NOT wear jewelry (body piercing), metal hair clips/bobby pins, make-up, or nail polish. Do NOT wear lotions, powders, or perfumes.  You may wear deoderant. Do NOT shave for 48 hours prior to surgery. Do NOT bring valuables to the hospital. Contacts, dentures, or bridgework may not be worn into surgery.   Leave suitcase in car.  After surgery it may be brought to your room.  For patients admitted to the hospital, checkout time is 11:00 AM the day of discharge.

## 2017-06-05 NOTE — Progress Notes (Signed)
GYNECOLOGY  VISIT   HPI: 41 y.o.   Single  African American  female   [phone] with Patient's last menstrual period was 05/30/2017.   here for  Surgical consultation.   Has fibroids, anemia, dysmenorrhea.  Has completed her childbearing and she declines medical therapy.  Pelvic ultrasound shows multiple small fibroids, EMS 6.69, and normal ovaries. Her EMB was negative in March 2018 with Dr. Leo Grosser. Last Hgb 10 on 05/09/17.  She is on iron bid and takes Seasonale OCPs.  She denies urinary incontinence with cough, sneeze.   No change in health care since prior visit.   Has a 27 year old child to care for.  Her ex-mother in law will help to care for her son.  Her 27 yo son will be with her the day after surgery to care for her.   GYNECOLOGIC HISTORY: Patient's last menstrual period was 05/30/2017. Contraception:  tubal Menopausal hormone therapy:  none Last mammogram:  09/2016 with [facility]--normal per patient Last pap smear:   09/2016 normal - results in EPIC, 12-02-13 normal per pt.  Remote hx of abnormal pap and had cryo in her early 80s.         OB History    Gravida Para Term Preterm AB Living   4 2 2  0 2 2   SAB TAB Ectopic Multiple Live Births   0 2 0 0 2         Patient Active Problem List   Diagnosis Date Noted  . 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 Menorrhagia 09/11/2016  . Routine general medical examination at a health care facility 09/21/2015  . Hyperlipidemia 06/22/2014  . Obesity 06/17/2014  . Uterine leiomyoma 06/17/2014  . Allergic rhinitis 06/17/2014    Past Medical History:  Diagnosis Date  . Abnormal uterine bleeding   . Anemia   . Endometriosis   . Fibroid   . Fibroids   . GERD (gastroesophageal reflux disease)   . Headache(784.0)   . No pertinent past medical history   . Ovarian cyst     Past Surgical History:  Procedure Laterality Date  . FOOT SURGERY    .  FRACTURE SURGERY    . TUBAL LIGATION      Current Outpatient Medications  Medication Sig Dispense Refill  . aspirin-acetaminophen-caffeine (EXCEDRIN MIGRAINE) 250-250-65 MG tablet Take 2 tablets daily as needed by mouth for headache.    . ferrous sulfate (SLOW FE) 160 (50 Fe) MG TBCR SR tablet Take 1 tablet (160 mg total) by mouth daily. (Patient taking differently: Take 2 tablets daily by mouth. ) 90 tablet 3  . levonorgestrel-ethinyl estradiol (SEASONALE,INTROVALE,JOLESSA) 0.15-0.03 MG tablet Take 1 tablet by mouth daily. 1 Package 1  . MELATONIN PO Take 1 tablet at bedtime as needed by mouth (sleep).    . Multiple Vitamin (MULTIVITAMIN WITH MINERALS) TABS tablet Take 1 tablet daily by mouth.     No current facility-administered medications for this visit.      ALLERGIES: Patient has no known allergies.  Family History  Problem Relation Age of Onset  . High blood pressure Mother   . Hypertension Mother   . Glaucoma Paternal Uncle   . Alzheimer's disease Paternal Uncle   . Hypertension Sister   . Other Sister        GYN complications  . Other Brother        Homicide  . Hypertension Maternal Grandmother   .  Heart disease Maternal Grandmother   . Hypertension Paternal Grandmother   . Stroke Paternal Grandmother   . Stroke Paternal Grandfather   . Ovarian cancer Other   . Cancer Neg Hx   . Depression Neg Hx   . Diabetes Neg Hx   . Drug abuse Neg Hx   . Early death Neg Hx   . Alcohol abuse Neg Hx   . Hyperlipidemia Neg Hx   . Kidney disease Neg Hx     Social History   Socioeconomic History  . Marital status: Single    Spouse name: Not on file  . Number of children: 1  . Years of education: college  . Highest education level: Not on file  Social Needs  . Financial resource strain: Not on file  . Food insecurity - worry: Not on file  . Food insecurity - inability: Not on file  . Transportation needs - medical: Not on file  . Transportation needs - non-medical: Not  on file  Occupational History  . 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 Smoking status: Never Smoker  . Smokeless tobacco: Never Used  Substance and Sexual Activity  . Alcohol use: No    Comment: socially- 1 glass of wine monthly  . Drug use: No  . Sexual activity: Yes    Partners: Male    Birth control/protection: Surgical    Comment: tubal  Other Topics Concern  . Not on file  Social History Narrative   Pt lives at home with her spouse and son.   Caffeine Use- Consumes coffee/tea twice a week    ROS:  Pertinent items are noted in HPI.  PHYSICAL EXAMINATION:    BP 114/70 (BP Location: Right Arm, Patient Position: Sitting, Cuff Size: Normal)   Pulse 72   Resp 16   Wt 168 lb (76.2 kg)   LMP 05/30/2017   BMI 27.96 kg/m     General appearance: alert, cooperative and appears stated age Head: Normocephalic, without obvious abnormality, atraumatic Neck: no adenopathy, supple, symmetrical, trachea midline and thyroid normal to inspection and palpation Lungs: clear to auscultation bilaterally Heart: regular rate and rhythm Abdomen: soft, non-tender, no masses,  no organomegaly Extremities: extremities normal, atraumatic, no cyanosis or edema Skin: Skin color, texture, turgor normal. No rashes or lesions Lymph nodes: Cervical, supraclavicular, and axillary nodes normal. No abnormal inguinal nodes palpated Neurologic: Grossly normal  Pelvic: External genitalia:  no lesions              Urethra:  normal appearing urethra with no masses, tenderness or lesions              Bartholins and Skenes: normal                 Vagina: normal appearing vagina with normal color and discharge, no lesions              Cervix: no lesions.  Small amount of menstrual flow.                Bimanual Exam:  Uterus:   8 week size and somewhat mobile, non-tender.              Adnexa: no mass, fullness, tenderness            Chaperone was  present for exam.  ASSESSMENT  Fibroids.  Anemia. Dysmenorrhea. Status post BTL, cervical cerclage, Myomectomy, Cesarean Section.   PLAN  I discussed total laparoscopic total hysterectomy with bilateral salpingectomy, possible bilateral oophorectomy, and cystoscopy.   I reviewed  Risks, benefits, and alternatives.  Risks include but are not limited to bleeding, infection, damage to surrounding organs, pneumonia, reaction to anesthesia, DVT, PE, death, need for reoperation, hernia formation, vaginal cuff dehiscence, continued pain, need to convert to a traditional laparotomy incision to complete the procedure, and menopausal symptoms and decreased libido if ovaries are removed.    If ovaries are diseased or if she has a lot of endometriosis, she wants her ovaries removed.  We would then do estrogen therapy and testosterone treatment if needed. Surgical expectations and recovery discussed. FLMA papers brought in today.  She will be out of work for 6 weeks.  She will need to stay overnight in the hospital as she does not have anyone to help her at home the night of surgery. Continue Fe po bid.  She will do a magnesium citrate bowel prep.  I discussed Lovenox for DVT prophylaxis. Hospital preop tomorrow.  Will get copy of her mammogram.  An After Visit Summary was printed and given to the patient.

## 2017-06-05 NOTE — Telephone Encounter (Signed)
Patient dropped off and paid for her FMLA forms.  Referred to [facility].

## 2017-06-06 ENCOUNTER — Encounter (HOSPITAL_COMMUNITY): Payer: Self-pay

## 2017-06-06 ENCOUNTER — Encounter (HOSPITAL_COMMUNITY)
Admission: RE | Admit: 2017-06-06 | Discharge: 2017-06-06 | Disposition: A | Discharge status: Home / Self Care | Payer: 59 | Source: Ambulatory Visit | Attending: Obstetrics and Gynecology | Admitting: Obstetrics and Gynecology

## 2017-06-06 ENCOUNTER — Other Ambulatory Visit: Payer: Self-pay

## 2017-06-06 DIAGNOSIS — D259 Leiomyoma of uterus, unspecified: Secondary | ICD-10-CM | POA: Diagnosis not present

## 2017-06-06 DIAGNOSIS — Z01812 Encounter for preprocedural laboratory examination: Secondary | ICD-10-CM | POA: Diagnosis present

## 2017-06-06 HISTORY — DX: Headache: R51

## 2017-06-06 HISTORY — DX: Headache, unspecified: R51.9

## 2017-06-06 LAB — BASIC METABOLIC PANEL
ANION GAP: 4 — AB (ref 5–15)
BUN: 11 mg/dL (ref 6–20)
CALCIUM: 9 mg/dL (ref 8.9–10.3)
CHLORIDE: 107 mmol/L (ref 101–111)
CO2: 25 mmol/L (ref 22–32)
Creatinine, Ser: 0.71 mg/dL (ref 0.44–1.00)
GFR calc non Af Amer: >60 mL/min (ref >60)
Glucose, Bld: 99 mg/dL (ref 65–99)
POTASSIUM: 4.4 mmol/L (ref 3.5–5.1)
Sodium: 136 mmol/L (ref 135–145)

## 2017-06-06 LAB — TYPE AND SCREEN
ABO/RH(D): A POS
Antibody Screen: NEGATIVE

## 2017-06-06 LAB — CBC
HEMATOCRIT: 31.5 % — AB (ref 36.0–46.0)
HEMOGLOBIN: 9.6 g/dL — AB (ref 12.0–15.0)
MCH: 25.9 pg — AB (ref 26.0–34.0)
MCHC: 30.5 g/dL (ref 30.0–36.0)
MCV: 85.1 fL (ref 78.0–100.0)
PLATELETS: 225 10*3/uL (ref 150–400)
RBC: 3.7 MIL/uL — AB (ref 3.87–5.11)
RDW: 18.5 % — AB (ref 11.5–15.5)
WBC: 7.8 10*3/uL (ref 4.0–10.5)

## 2017-06-07 NOTE — Pre-Procedure Instructions (Signed)
I spoke with [facility] and informed her of her Hgb 9.6 results from 06/06/17.  She had stopped taking all supplements including the SlowFe in preparation for this surgery.  I informed her that Dr. Quincy Simmonds had recommended she start an over the counter iron sulfate 325 mg with orange juice three times a day and to stop the SlowFe.  See Result note for CBC in epic 06/06/17 at 5:35 PM.  Carol Logan repeated back the instructed and verbalized understanding.

## 2017-06-10 NOTE — H&P (Signed)
Office Visit   06/05/2017 [facility]    Yisroel Ramming, Everardo All, MD  Obstetrics and Gynecology   Fibroids +2 more  Dx   Office Visit   ; Referred by Carlyle Dolly, MD  Reason for Visit   Additional Documentation   Vitals:   BP 114/70 (BP Location: Right Arm, Patient Position: Sitting, Cuff Size: Normal)   Pulse 72   Resp 16   Wt 168 lb (76.2 kg)   LMP 05/30/2017   BMI 27.96 kg/m   BSA 1.87 m   Flowsheets:   MEWS Score,   Anthropometrics     Encounter Info:   Billing Info,   History,   Allergies,   Detailed Report     All Notes   Progress Notes by Nunzio Cobbs, MD at 06/05/2017 11:30 AM   Author: Nunzio Cobbs, MD Author Type: Physician Filed: 06/06/2017 8:23 AM  Note Status: Signed Cosign: Cosign Not Required Encounter Date: 06/05/2017  Editor: Nunzio Cobbs, MD (Physician)  Prior Versions: 1. Archie Balboa CMA (Certified Medical Assistant) at 06/05/2017 12:08 PM - Sign at close encounter    GYNECOLOGY  VISIT   HPI: 41 y.o.   Single  African American  female   [phone] with Patient's last menstrual period was 05/30/2017.   here for  Surgical consultation.   Has fibroids, anemia, dysmenorrhea.  Has completed her childbearing and she declines medical therapy.  Pelvic ultrasound shows multiple small fibroids, EMS 6.69, and normal ovaries. Her EMB was negative in March 2018 with Dr. Leo Grosser. Last Hgb 10 on 05/09/17.  She is on iron bid and takes Seasonale OCPs.  She denies urinary incontinence with cough, sneeze.   No change in health care since prior visit.   Has a 63 year old child to care for.  Her ex-mother in law will help to care for her son.  Her 27 yo son will be with her the day after surgery to care for her.   GYNECOLOGIC HISTORY: Patient's last menstrual period was 05/30/2017. Contraception:  tubal Menopausal hormone therapy:  none Last mammogram:  09/2016 with  [facility]--normal per patient Last pap smear:   09/2016 normal - results in EPIC, 12-02-13 normal per pt.  Remote hx of abnormal pap and had cryo in her early 37s.                 OB History    Gravida Para Term Preterm AB Living   4 2 2  0 2 2   SAB TAB Ectopic Multiple Live Births   0 2 0 0 2             Patient Active Problem List   Diagnosis Date Noted  . 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 Menorrhagia 09/11/2016  . Routine general medical examination at a health care facility 09/21/2015  . Hyperlipidemia 06/22/2014  . Obesity 06/17/2014  . Uterine leiomyoma 06/17/2014  . Allergic rhinitis 06/17/2014        Past Medical History:  Diagnosis Date  . Abnormal uterine bleeding   . Anemia   . Endometriosis   . Fibroid   . Fibroids   . GERD (gastroesophageal reflux disease)   . Headache(784.0)   . No pertinent past medical history   . Ovarian cyst          Past Surgical History:  Procedure  Laterality Date  . FOOT SURGERY    . FRACTURE SURGERY    . TUBAL LIGATION            Current Outpatient Medications  Medication Sig Dispense Refill  . aspirin-acetaminophen-caffeine (EXCEDRIN MIGRAINE) 250-250-65 MG tablet Take 2 tablets daily as needed by mouth for headache.    . ferrous sulfate (SLOW FE) 160 (50 Fe) MG TBCR SR tablet Take 1 tablet (160 mg total) by mouth daily. (Patient taking differently: Take 2 tablets daily by mouth. ) 90 tablet 3  . levonorgestrel-ethinyl estradiol (SEASONALE,INTROVALE,JOLESSA) 0.15-0.03 MG tablet Take 1 tablet by mouth daily. 1 Package 1  . MELATONIN PO Take 1 tablet at bedtime as needed by mouth (sleep).    . Multiple Vitamin (MULTIVITAMIN WITH MINERALS) TABS tablet Take 1 tablet daily by mouth.     No current facility-administered medications for this visit.      ALLERGIES: Patient has no known allergies.       Family History    Problem Relation Age of Onset  . High blood pressure Mother   . Hypertension Mother   . Glaucoma Paternal Uncle   . Alzheimer's disease Paternal Uncle   . Hypertension Sister   . Other Sister        GYN complications  . Other Brother        Homicide  . Hypertension Maternal Grandmother   . Heart disease Maternal Grandmother   . Hypertension Paternal Grandmother   . Stroke Paternal Grandmother   . Stroke Paternal Grandfather   . Ovarian cancer Other   . Cancer Neg Hx   . Depression Neg Hx   . Diabetes Neg Hx   . Drug abuse Neg Hx   . Early death Neg Hx   . Alcohol abuse Neg Hx   . Hyperlipidemia Neg Hx   . Kidney disease Neg Hx     Social History        Socioeconomic History  . Marital status: Single    Spouse name: Not on file  . Number of children: 1  . Years of education: college  . Highest education level: Not on file  Social Needs  . Financial resource strain: Not on file  . Food insecurity - worry: Not on file  . Food insecurity - inability: Not on file  . Transportation needs - medical: Not on file  . Transportation needs - non-medical: Not on file  Occupational History  . 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 Smoking status: Never Smoker  . Smokeless tobacco: Never Used  Substance and Sexual Activity  . Alcohol use: No    Comment: socially- 1 glass of wine monthly  . Drug use: No  . Sexual activity: Yes    Partners: Male    Birth control/protection: Surgical    Comment: tubal  Other Topics Concern  . Not on file  Social History Narrative   Pt lives at home with her spouse and son.   Caffeine Use- Consumes coffee/tea twice a week    ROS:  Pertinent items are noted in HPI.  PHYSICAL EXAMINATION:    BP 114/70 (BP Location: Right Arm, Patient Position: Sitting, Cuff Size: Normal)   Pulse 72   Resp 16   Wt 168 lb (76.2 kg)   LMP 05/30/2017    BMI 27.96 kg/m     General appearance: alert, cooperative and appears stated age Head: Normocephalic, without obvious  abnormality, atraumatic Neck: no adenopathy, supple, symmetrical, trachea midline and thyroid normal to inspection and palpation Lungs: clear to auscultation bilaterally Heart: regular rate and rhythm Abdomen: soft, non-tender, no masses,  no organomegaly Extremities: extremities normal, atraumatic, no cyanosis or edema Skin: Skin color, texture, turgor normal. No rashes or lesions Lymph nodes: Cervical, supraclavicular, and axillary nodes normal. No abnormal inguinal nodes palpated Neurologic: Grossly normal  Pelvic: External genitalia:  no lesions              Urethra:  normal appearing urethra with no masses, tenderness or lesions              Bartholins and Skenes: normal                 Vagina: normal appearing vagina with normal color and discharge, no lesions              Cervix: no lesions.  Small amount of menstrual flow.                Bimanual Exam:  Uterus:   8 week size and somewhat mobile, non-tender.              Adnexa: no mass, fullness, tenderness            Chaperone was present for exam.  ASSESSMENT  Fibroids.  Anemia. Dysmenorrhea. Status post BTL, cervical cerclage, Myomectomy, Cesarean Section.   PLAN  I discussed total laparoscopic total hysterectomy with bilateral salpingectomy, possible bilateral oophorectomy, and cystoscopy.   I reviewed  Risks, benefits, and alternatives.  Risks include but are not limited to bleeding, infection, damage to surrounding organs, pneumonia, reaction to anesthesia, DVT, PE, death, need for reoperation, hernia formation, vaginal cuff dehiscence, continued pain, need to convert to a traditional laparotomy incision to complete the procedure, and menopausal symptoms and decreased libido if ovaries are removed.    If ovaries are diseased or if she has a lot of endometriosis, she wants her ovaries  removed.  We would then do estrogen therapy and testosterone treatment if needed. Surgical expectations and recovery discussed. FLMA papers brought in today.  She will be out of work for 6 weeks.  She will need to stay overnight in the hospital as she does not have anyone to help her at home the night of surgery. Continue Fe po bid.  She will do a magnesium citrate bowel prep.  I discussed Lovenox for DVT prophylaxis. Hospital preop tomorrow.  Will get copy of her mammogram.  An After Visit Summary was printed and given to the patient.

## 2017-06-10 NOTE — Anesthesia Preprocedure Evaluation (Addendum)
Anesthesia Evaluation  Patient identified by MRN, date of birth, ID band Patient awake    Reviewed: Allergy & Precautions, H&P , NPO status , Patient's Chart, lab work & pertinent test results  History of Anesthesia Complications Negative for: history of anesthetic complications  Airway Mallampati: II  TM Distance: >3 FB Neck ROM: Full    Dental  (+) Teeth Intact, Dental Advisory Given   Pulmonary neg pulmonary ROS,    Pulmonary exam normal        Cardiovascular negative cardio ROS   Rhythm:Regular Rate:Normal     Neuro/Psych negative neurological ROS  negative psych ROS   GI/Hepatic Neg liver ROS, GERD  Medicated and Controlled,  Endo/Other  negative endocrine ROS  Renal/GU negative Renal ROS     Musculoskeletal negative musculoskeletal ROS (+)   Abdominal   Peds  Hematology  (+) anemia ,   Anesthesia Other Findings   Reproductive/Obstetrics (+) Pregnancy Scheduled c section for previous myomectomy, cerclage during pregnancy and since removed                            Anesthesia Physical  Anesthesia Plan  ASA: II  Anesthesia Plan: General   Post-op Pain Management:    Induction:   PONV Risk Score and Plan: 3 and Ondansetron, Dexamethasone, Scopolamine patch - Pre-op and Diphenhydramine  Airway Management Planned: Oral ETT  Additional Equipment: None  Intra-op Plan:   Post-operative Plan:   Informed Consent: I have reviewed the patients History and Physical, chart, labs and discussed the procedure including the risks, benefits and alternatives for the proposed anesthesia with the patient or authorized representative who has indicated his/her understanding and acceptance.   Dental advisory given  Plan Discussed with: CRNA, Anesthesiologist and Surgeon  Anesthesia Plan Comments:        Anesthesia Quick Evaluation

## 2017-06-11 ENCOUNTER — Encounter (HOSPITAL_COMMUNITY)
Admission: RE | Disposition: A | Discharge status: Home / Self Care | Payer: Self-pay | Source: Ambulatory Visit | Attending: Obstetrics and Gynecology

## 2017-06-11 ENCOUNTER — Other Ambulatory Visit: Payer: Self-pay

## 2017-06-11 ENCOUNTER — Ambulatory Visit (HOSPITAL_COMMUNITY): Payer: 59 | Admitting: Anesthesiology

## 2017-06-11 ENCOUNTER — Encounter (HOSPITAL_COMMUNITY): Payer: Self-pay | Admitting: *Deleted

## 2017-06-11 ENCOUNTER — Observation Stay (HOSPITAL_COMMUNITY)
Admission: RE | Admit: 2017-06-11 | Discharge: 2017-06-12 | Disposition: A | Discharge status: Home / Self Care | Payer: 59 | Source: Ambulatory Visit | Attending: Obstetrics and Gynecology | Admitting: Obstetrics and Gynecology

## 2017-06-11 DIAGNOSIS — D649 Anemia, unspecified: Secondary | ICD-10-CM | POA: Diagnosis not present

## 2017-06-11 DIAGNOSIS — E669 Obesity, unspecified: Secondary | ICD-10-CM | POA: Insufficient documentation

## 2017-06-11 DIAGNOSIS — Z90710 Acquired absence of both cervix and uterus: Secondary | ICD-10-CM | POA: Diagnosis present

## 2017-06-11 DIAGNOSIS — N736 Female pelvic peritoneal adhesions (postinfective): Secondary | ICD-10-CM | POA: Diagnosis not present

## 2017-06-11 DIAGNOSIS — N946 Dysmenorrhea, unspecified: Secondary | ICD-10-CM | POA: Insufficient documentation

## 2017-06-11 DIAGNOSIS — N7011 Chronic salpingitis: Secondary | ICD-10-CM | POA: Insufficient documentation

## 2017-06-11 DIAGNOSIS — K660 Peritoneal adhesions (postprocedural) (postinfection): Secondary | ICD-10-CM | POA: Diagnosis not present

## 2017-06-11 DIAGNOSIS — Z9851 Tubal ligation status: Secondary | ICD-10-CM | POA: Insufficient documentation

## 2017-06-11 DIAGNOSIS — D259 Leiomyoma of uterus, unspecified: Secondary | ICD-10-CM | POA: Diagnosis not present

## 2017-06-11 DIAGNOSIS — N800 Endometriosis of uterus: Secondary | ICD-10-CM | POA: Diagnosis not present

## 2017-06-11 DIAGNOSIS — Z9889 Other specified postprocedural states: Secondary | ICD-10-CM | POA: Diagnosis not present

## 2017-06-11 DIAGNOSIS — N920 Excessive and frequent menstruation with regular cycle: Secondary | ICD-10-CM | POA: Diagnosis not present

## 2017-06-11 DIAGNOSIS — Z6827 Body mass index (BMI) 27.0-27.9, adult: Secondary | ICD-10-CM | POA: Insufficient documentation

## 2017-06-11 DIAGNOSIS — D251 Intramural leiomyoma of uterus: Principal | ICD-10-CM | POA: Insufficient documentation

## 2017-06-11 DIAGNOSIS — Z793 Long term (current) use of hormonal contraceptives: Secondary | ICD-10-CM | POA: Diagnosis not present

## 2017-06-11 DIAGNOSIS — Z7982 Long term (current) use of aspirin: Secondary | ICD-10-CM | POA: Insufficient documentation

## 2017-06-11 DIAGNOSIS — Z79899 Other long term (current) drug therapy: Secondary | ICD-10-CM | POA: Diagnosis not present

## 2017-06-11 HISTORY — PX: CYSTOSCOPY: SHX5120

## 2017-06-11 HISTORY — PX: TOTAL LAPAROSCOPIC HYSTERECTOMY WITH SALPINGECTOMY: SHX6742

## 2017-06-11 HISTORY — PX: LYSIS OF ADHESION: SHX5961

## 2017-06-11 LAB — PREGNANCY, URINE: Preg Test, Ur: NEGATIVE

## 2017-06-11 SURGERY — HYSTERECTOMY, TOTAL, LAPAROSCOPIC, WITH SALPINGECTOMY
Anesthesia: General | Site: Bladder

## 2017-06-11 MED ORDER — ONDANSETRON HCL 4 MG/2ML IJ SOLN
INTRAMUSCULAR | Status: AC
  Filled 2017-06-11: qty 2

## 2017-06-11 MED ORDER — SCOPOLAMINE 1 MG/3DAYS TD PT72
1.0000 | MEDICATED_PATCH | Freq: Once | TRANSDERMAL | Status: DC
  Administered 2017-06-11: 1.5 mg via TRANSDERMAL

## 2017-06-11 MED ORDER — LIDOCAINE HCL (CARDIAC) 20 MG/ML IV SOLN
INTRAVENOUS | Status: AC
  Filled 2017-06-11: qty 5

## 2017-06-11 MED ORDER — LACTATED RINGERS IV SOLN: INTRAVENOUS | Status: DC

## 2017-06-11 MED ORDER — DEXAMETHASONE SODIUM PHOSPHATE 10 MG/ML IJ SOLN
INTRAMUSCULAR | Status: AC
  Filled 2017-06-11: qty 1

## 2017-06-11 MED ORDER — LACTATED RINGERS IV SOLN
INTRAVENOUS | Status: DC
  Administered 2017-06-11 (×2): via INTRAVENOUS

## 2017-06-11 MED ORDER — PROPOFOL 10 MG/ML IV BOLUS
INTRAVENOUS | Status: AC
  Filled 2017-06-11: qty 20

## 2017-06-11 MED ORDER — EPHEDRINE 5 MG/ML INJ
INTRAVENOUS | Status: AC
  Filled 2017-06-11: qty 10

## 2017-06-11 MED ORDER — ONDANSETRON HCL 4 MG/2ML IJ SOLN
INTRAMUSCULAR | Status: DC | PRN
  Administered 2017-06-11: 4 mg via INTRAVENOUS

## 2017-06-11 MED ORDER — MIDAZOLAM HCL 2 MG/2ML IJ SOLN
INTRAMUSCULAR | Status: AC
  Filled 2017-06-11: qty 2

## 2017-06-11 MED ORDER — SODIUM CHLORIDE 0.9 % IR SOLN
Status: DC | PRN
  Administered 2017-06-11: 3000 mL

## 2017-06-11 MED ORDER — ROCURONIUM BROMIDE 10 MG/ML (PF) SYRINGE
PREFILLED_SYRINGE | INTRAVENOUS | Status: DC | PRN
  Administered 2017-06-11: 10 mg via INTRAVENOUS
  Administered 2017-06-11 (×2): 20 mg via INTRAVENOUS
  Administered 2017-06-11: 50 mg via INTRAVENOUS

## 2017-06-11 MED ORDER — ONDANSETRON HCL 4 MG/2ML IJ SOLN
4.0000 mg | Freq: Four times a day (QID) | INTRAMUSCULAR | Status: DC | PRN
  Administered 2017-06-11: 4 mg via INTRAVENOUS
  Filled 2017-06-11: qty 2

## 2017-06-11 MED ORDER — MORPHINE SULFATE (PF) 4 MG/ML IV SOLN
2.0000 mg | INTRAVENOUS | Status: DC | PRN
  Administered 2017-06-11 (×2): 2 mg via INTRAVENOUS
  Filled 2017-06-11 (×2): qty 1

## 2017-06-11 MED ORDER — MIDAZOLAM HCL 5 MG/5ML IJ SOLN
INTRAMUSCULAR | Status: DC | PRN
  Administered 2017-06-11: 2 mg via INTRAVENOUS

## 2017-06-11 MED ORDER — SUGAMMADEX SODIUM 200 MG/2ML IV SOLN
INTRAVENOUS | Status: AC
  Filled 2017-06-11: qty 2

## 2017-06-11 MED ORDER — MENTHOL 3 MG MT LOZG: 1.0000 | LOZENGE | OROMUCOSAL | Status: DC | PRN

## 2017-06-11 MED ORDER — PROMETHAZINE HCL 25 MG/ML IJ SOLN: 6.2500 mg | INTRAMUSCULAR | Status: DC | PRN

## 2017-06-11 MED ORDER — ROPIVACAINE HCL 5 MG/ML IJ SOLN
INTRAMUSCULAR | Status: AC
  Filled 2017-06-11: qty 30

## 2017-06-11 MED ORDER — CEFOTETAN DISODIUM-DEXTROSE 2-2.08 GM-%(50ML) IV SOLR
2.0000 g | INTRAVENOUS | Status: AC
  Administered 2017-06-11: 2 g via INTRAVENOUS

## 2017-06-11 MED ORDER — FENTANYL CITRATE (PF) 250 MCG/5ML IJ SOLN
INTRAMUSCULAR | Status: DC | PRN
  Administered 2017-06-11: 100 ug via INTRAVENOUS
  Administered 2017-06-11 (×3): 50 ug via INTRAVENOUS

## 2017-06-11 MED ORDER — OXYCODONE-ACETAMINOPHEN 5-325 MG PO TABS
1.0000 | ORAL_TABLET | ORAL | Status: DC | PRN
  Administered 2017-06-11: 2 via ORAL
  Administered 2017-06-12 (×2): 1 via ORAL
  Filled 2017-06-11: qty 2
  Filled 2017-06-11 (×2): qty 1

## 2017-06-11 MED ORDER — BUPIVACAINE HCL (PF) 0.25 % IJ SOLN
INTRAMUSCULAR | Status: AC
  Filled 2017-06-11: qty 30

## 2017-06-11 MED ORDER — HYDROMORPHONE HCL 1 MG/ML IJ SOLN: 0.2500 mg | INTRAMUSCULAR | Status: DC | PRN

## 2017-06-11 MED ORDER — DEXAMETHASONE SODIUM PHOSPHATE 10 MG/ML IJ SOLN
INTRAMUSCULAR | Status: DC | PRN
  Administered 2017-06-11: 10 mg via INTRAVENOUS

## 2017-06-11 MED ORDER — ROCURONIUM BROMIDE 100 MG/10ML IV SOLN
INTRAVENOUS | Status: AC
  Filled 2017-06-11: qty 1

## 2017-06-11 MED ORDER — ONDANSETRON HCL 4 MG PO TABS: 4.0000 mg | ORAL_TABLET | Freq: Four times a day (QID) | ORAL | Status: DC | PRN

## 2017-06-11 MED ORDER — CEFOTETAN DISODIUM-DEXTROSE 2-2.08 GM-%(50ML) IV SOLR
INTRAVENOUS | Status: AC
  Filled 2017-06-11: qty 50

## 2017-06-11 MED ORDER — BUPIVACAINE HCL (PF) 0.25 % IJ SOLN
INTRAMUSCULAR | Status: DC | PRN
  Administered 2017-06-11: 15 mL

## 2017-06-11 MED ORDER — SCOPOLAMINE 1 MG/3DAYS TD PT72
MEDICATED_PATCH | TRANSDERMAL | Status: AC
  Filled 2017-06-11: qty 1

## 2017-06-11 MED ORDER — LACTATED RINGERS IV SOLN
INTRAVENOUS | Status: DC
  Administered 2017-06-11 (×2): via INTRAVENOUS

## 2017-06-11 MED ORDER — SODIUM CHLORIDE 0.9 % IJ SOLN
INTRAMUSCULAR | Status: AC
  Filled 2017-06-11: qty 50

## 2017-06-11 MED ORDER — FENTANYL CITRATE (PF) 250 MCG/5ML IJ SOLN
INTRAMUSCULAR | Status: AC
  Filled 2017-06-11: qty 5

## 2017-06-11 MED ORDER — PROPOFOL 10 MG/ML IV BOLUS
INTRAVENOUS | Status: DC | PRN
  Administered 2017-06-11: 180 mg via INTRAVENOUS

## 2017-06-11 MED ORDER — STERILE WATER FOR IRRIGATION IR SOLN
Status: DC | PRN
  Administered 2017-06-11: 1000 mL via INTRAVESICAL

## 2017-06-11 MED ORDER — LIDOCAINE 2% (20 MG/ML) 5 ML SYRINGE
INTRAMUSCULAR | Status: DC | PRN
  Administered 2017-06-11: 100 mg via INTRAVENOUS

## 2017-06-11 MED ORDER — EPHEDRINE SULFATE-NACL 50-0.9 MG/10ML-% IV SOSY
PREFILLED_SYRINGE | INTRAVENOUS | Status: DC | PRN
  Administered 2017-06-11: 15 mg via INTRAVENOUS

## 2017-06-11 MED ORDER — ROPIVACAINE HCL 5 MG/ML IJ SOLN
INTRAMUSCULAR | Status: DC | PRN
  Administered 2017-06-11: 60 mL

## 2017-06-11 MED ORDER — ENOXAPARIN SODIUM 40 MG/0.4ML ~~LOC~~ SOLN
40.0000 mg | SUBCUTANEOUS | Status: AC
  Administered 2017-06-11: 40 mg via SUBCUTANEOUS
  Filled 2017-06-11: qty 0.4

## 2017-06-11 MED ORDER — KETOROLAC TROMETHAMINE 30 MG/ML IJ SOLN
30.0000 mg | Freq: Four times a day (QID) | INTRAMUSCULAR | Status: DC
  Administered 2017-06-11 – 2017-06-12 (×4): 30 mg via INTRAVENOUS
  Filled 2017-06-11 (×4): qty 1

## 2017-06-11 MED ORDER — IBUPROFEN 600 MG PO TABS
600.0000 mg | ORAL_TABLET | Freq: Four times a day (QID) | ORAL | Status: DC | PRN
  Administered 2017-06-12: 600 mg via ORAL
  Filled 2017-06-11: qty 1

## 2017-06-11 MED ORDER — SUGAMMADEX SODIUM 200 MG/2ML IV SOLN
INTRAVENOUS | Status: DC | PRN
  Administered 2017-06-11: 200 mg via INTRAVENOUS

## 2017-06-11 SURGICAL SUPPLY — 60 items
ADH SKN CLS APL DERMABOND .7 (GAUZE/BANDAGES/DRESSINGS) ×3
APL SRG 38 LTWT LNG FL B (MISCELLANEOUS)
APPLICATOR ARISTA FLEXITIP XL (MISCELLANEOUS) IMPLANT
BLADE SURG 10 STRL SS (BLADE) IMPLANT
CABLE HIGH FREQUENCY MONO STRZ (ELECTRODE) ×5 IMPLANT
CELL SAVER LIPIGURD (MISCELLANEOUS) IMPLANT
COVER BACK TABLE 60X90IN (DRAPES) IMPLANT
COVER LIGHT HANDLE  1/PK (MISCELLANEOUS) ×2
COVER LIGHT HANDLE 1/PK (MISCELLANEOUS) IMPLANT
COVER MAYO STAND STRL (DRAPES) ×5 IMPLANT
DECANTER SPIKE VIAL GLASS SM (MISCELLANEOUS) ×10 IMPLANT
DERMABOND ADVANCED (GAUZE/BANDAGES/DRESSINGS) ×2
DERMABOND ADVANCED .7 DNX12 (GAUZE/BANDAGES/DRESSINGS) ×3 IMPLANT
DEVICE RETRIEVAL ALEXIS 14 (MISCELLANEOUS) IMPLANT
DRAPE STERI URO 9X17 APER PCH (DRAPES) ×2 IMPLANT
DURAPREP 26ML APPLICATOR (WOUND CARE) ×5 IMPLANT
EXTRT SYSTEM ALEXIS 14CM (MISCELLANEOUS)
EXTRT SYSTEM ALEXIS 17CM (MISCELLANEOUS)
GLOVE BIO SURGEON STRL SZ 6.5 (GLOVE) ×8 IMPLANT
GLOVE BIO SURGEONS STRL SZ 6.5 (GLOVE) ×2
GLOVE BIOGEL PI IND STRL 7.0 (GLOVE) ×9 IMPLANT
GLOVE BIOGEL PI INDICATOR 7.0 (GLOVE) ×6
HEMOSTAT ARISTA ABSORB 3G PWDR (MISCELLANEOUS) IMPLANT
LIGASURE VESSEL 5MM BLUNT TIP (ELECTROSURGICAL) ×5 IMPLANT
NEEDLE INSUFFLATION 120MM (ENDOMECHANICALS) ×5 IMPLANT
OCCLUDER COLPOPNEUMO (BALLOONS) ×5 IMPLANT
PACK LAPAROSCOPY BASIN (CUSTOM PROCEDURE TRAY) ×5 IMPLANT
PACK ROBOTIC GOWN (GOWN DISPOSABLE) ×5 IMPLANT
PACK TRENDGUARD 450 HYBRID PRO (MISCELLANEOUS) IMPLANT
PACK TRENDGUARD 600 HYBRD PROC (MISCELLANEOUS) IMPLANT
POUCH LAPAROSCOPIC INSTRUMENT (MISCELLANEOUS) ×5 IMPLANT
PROTECTOR NERVE ULNAR (MISCELLANEOUS) ×10 IMPLANT
SCISSORS LAP 5X35 DISP (ENDOMECHANICALS) ×5 IMPLANT
SET CYSTO W/LG BORE CLAMP LF (SET/KITS/TRAYS/PACK) ×5 IMPLANT
SET IRRIG TUBING LAPAROSCOPIC (IRRIGATION / IRRIGATOR) ×5 IMPLANT
SET TRI-LUMEN FLTR TB AIRSEAL (TUBING) ×5 IMPLANT
SLEEVE ADV FIXATION 5X100MM (TROCAR) ×5 IMPLANT
SUT VIC AB 0 CT1 27 (SUTURE) ×10
SUT VIC AB 0 CT1 27XBRD ANBCTR (SUTURE) ×6 IMPLANT
SUT VICRYL 0 UR6 27IN ABS (SUTURE) IMPLANT
SUT VICRYL 4-0 PS2 18IN ABS (SUTURE) ×5 IMPLANT
SUT VLOC 180 0 9IN  GS21 (SUTURE) ×2
SUT VLOC 180 0 9IN GS21 (SUTURE) IMPLANT
SYR 10ML LL (SYRINGE) ×10 IMPLANT
SYR 50ML LL SCALE MARK (SYRINGE) ×10 IMPLANT
SYSTEM CARTER THOMASON II (TROCAR) ×2 IMPLANT
SYSTEM CONTND EXTRCTN KII BLLN (MISCELLANEOUS) IMPLANT
TIP RUMI ORANGE 6.7MMX12CM (TIP) IMPLANT
TIP UTERINE 5.1X6CM LAV DISP (MISCELLANEOUS) IMPLANT
TIP UTERINE 6.7X10CM GRN DISP (MISCELLANEOUS) IMPLANT
TIP UTERINE 6.7X6CM WHT DISP (MISCELLANEOUS) IMPLANT
TIP UTERINE 6.7X8CM BLUE DISP (MISCELLANEOUS) ×2 IMPLANT
TOWEL OR 17X24 6PK STRL BLUE (TOWEL DISPOSABLE) ×10 IMPLANT
TRAY FOLEY CATH SILVER 14FR (SET/KITS/TRAYS/PACK) ×5 IMPLANT
TRENDGUARD 450 HYBRID PRO PACK (MISCELLANEOUS) ×5
TRENDGUARD 600 HYBRID PROC PK (MISCELLANEOUS)
TROCAR ADV FIXATION 5X100MM (TROCAR) ×5 IMPLANT
TROCAR PORT AIRSEAL 8X120 (TROCAR) ×5 IMPLANT
TROCAR XCEL NON-BLD 5MMX100MML (ENDOMECHANICALS) ×5 IMPLANT
WARMER LAPAROSCOPE (MISCELLANEOUS) ×5 IMPLANT

## 2017-06-11 NOTE — Progress Notes (Signed)
Update to History and Physical  Still having vaginal bleeding.  Hgb 9.6. She has type and screen.   OK to proceed with surgery.  She understands there is a risk of conversion to laparotomy if needed.  She is prepared for potential blood transfusion.

## 2017-06-11 NOTE — Brief Op Note (Signed)
06/11/2017  10:35 AM  PATIENT:  Carol Logan  41 y.o. female  PRE-OPERATIVE DIAGNOSIS:  Fibroids, Dysmenorrhea, Anemia  POST-OPERATIVE DIAGNOSIS:  Fibroids, Dysmenorrhea, Anemia  PROCEDURE:  Procedure(s): TOTAL LAPAROSCOPIC HYSTERECTOMY WITH SALPINGECTOMY (Bilateral) CYSTOSCOPY (N/A) LYSIS OF OMENTAL ADHESIONS (N/A)  SURGEON:  Surgeon(s) and Role:    * Amundson Raliegh Ip, MD - Primary    * Megan Salon, MD - Assisting  PHYSICIAN ASSISTANT:  NA  ASSISTANTS: Megan Salon, MD   ANESTHESIA:   local, general and Intraperitoneal ropivicaine  EBL:  125 mL   BLOOD ADMINISTERED:none  DRAINS: none   LOCAL MEDICATIONS USED:  MARCAINE     SPECIMEN:  Source of Specimen:  uterus, cervix, bilateral tubes.  DISPOSITION OF SPECIMEN:  PATHOLOGY  COUNTS:  YES  TOURNIQUET:  * No tourniquets in log *  DICTATION: .Note written in [place]: Admit for overnight observation  PATIENT DISPOSITION:  PACU - hemodynamically stable.   Delay start of Pharmacological VTE agent (>24hrs) due to surgical blood loss or risk of bleeding: not applicable

## 2017-06-11 NOTE — Anesthesia Postprocedure Evaluation (Signed)
Anesthesia Post Note  Patient: Carol Logan  Procedure(s) Performed: TOTAL LAPAROSCOPIC HYSTERECTOMY WITH SALPINGECTOMY (Bilateral Abdomen) CYSTOSCOPY (N/A Bladder) LYSIS OF OMENTAL ADHESIONS (N/A Abdomen)     Patient location during evaluation: [facility] Anesthesia Type: General Level of consciousness: awake, awake and alert and oriented Pain management: satisfactory to patient Vital Signs Assessment: post-procedure vital signs reviewed and stable Respiratory status: spontaneous breathing, nonlabored ventilation and respiratory function stable Cardiovascular status: stable Postop Assessment: no apparent nausea or vomiting and adequate PO intake Anesthetic complications: no    Last Vitals:  Vitals:   06/11/17 1220 06/11/17 1345  BP: 131/84 133/81  Pulse: 77 70  Resp: 18 16  Temp: 36.5 C (!) 36.4 C  SpO2: 100% 100%    Last Pain:  Vitals:   06/11/17 1345  TempSrc: Oral  PainSc:    Pain Goal: Patients Stated Pain Goal: 8 (06/11/17 1314)               Talitha Givens

## 2017-06-11 NOTE — Anesthesia Procedure Notes (Signed)
Procedure Name: Intubation Date/Time: 06/11/2017 7:30 AM Performed by: Talbot Grumbling, CRNA Pre-anesthesia Checklist: Patient identified, Emergency Drugs available, Suction available and Patient being monitored Patient Re-evaluated:Patient Re-evaluated prior to induction Oxygen Delivery Method: Circle system utilized Preoxygenation: Pre-oxygenation with 100% oxygen Induction Type: IV induction Ventilation: Mask ventilation without difficulty Laryngoscope Size: Miller and 2 Grade View: Grade I Tube type: Oral Tube size: 7.0 mm Number of attempts: 1 Airway Equipment and Method: Stylet Placement Confirmation: ETT inserted through vocal cords under direct vision,  positive ETCO2 and breath sounds checked- equal and bilateral Secured at: 21 cm Tube secured with: Tape Dental Injury: Teeth and Oropharynx as per pre-operative assessment

## 2017-06-11 NOTE — Progress Notes (Signed)
Day of Surgery Procedure(s) (LRB): TOTAL LAPAROSCOPIC HYSTERECTOMY WITH SALPINGECTOMY (Bilateral) CYSTOSCOPY (N/A) LYSIS OF OMENTAL ADHESIONS (N/A)  Subjective: Patient reports tolerating PO and no problems voiding.   Taking Morphine and Toradol for pain.  Objective: I have reviewed patient's vital signs and intake and output. Vitals:   06/11/17 1345 06/11/17 1745  BP: 133/81 136/86  Pulse: 70 85  Resp: 16 16  Temp: (!) 97.5 F (36.4 C) 98.5 F (36.9 C)  SpO2: 100% 100%    I/O - 1820 cc/1925 cc.   General: alert and cooperative Resp: clear to auscultation bilaterally Cardio: regular rate and rhythm, S1, S2 normal, no murmur, click, rub or gallop GI: soft, non-tender; bowel sounds normal; no masses,  no organomegaly and incision: clean, dry and intact Extremities: PAS and ted hose on. DPs 2+ bilaterally.  Vaginal Bleeding: none  Assessment: s/p Procedure(s): TOTAL LAPAROSCOPIC HYSTERECTOMY WITH SALPINGECTOMY (Bilateral) CYSTOSCOPY (N/A) LYSIS OF OMENTAL ADHESIONS (N/A): progressing well  Plan: Advance to PO medication CBC and BMP in am.   Surgical findings and procedure reviewed with patient.  Anticipated discharge tomorrow am.    LOS: 0 days    Arloa Koh 06/11/2017, 6:05 PM

## 2017-06-11 NOTE — Transfer of Care (Signed)
Immediate Anesthesia Transfer of Care Note  Patient: Carol Logan  Procedure(s) Performed: TOTAL LAPAROSCOPIC HYSTERECTOMY WITH SALPINGECTOMY (Bilateral Abdomen) CYSTOSCOPY (N/A Bladder) LYSIS OF OMENTAL ADHESIONS (N/A Abdomen)  Patient Location: PACU  Anesthesia Type:General  Level of Consciousness: sedated  Airway & Oxygen Therapy: Patient Spontanous Breathing and Patient connected to nasal cannula oxygen  Post-op Assessment: Report given to RN and Post -op Vital signs reviewed and stable  Post vital signs: Reviewed and stable  Last Vitals:  Vitals:   06/11/17 0626  BP: 112/75  Pulse: 74  Resp: 16  Temp: 36.9 C  SpO2: 99%    Last Pain:  Vitals:   06/11/17 0626  TempSrc: Oral  PainSc: 3          Complications: No apparent anesthesia complications

## 2017-06-11 NOTE — Op Note (Signed)
OPERATIVE REPORT   PREOPERATIVE DIAGNOSIS: Fibroids, dysmenorrhea, anemia.  POSTOPERATIVE DIAGNOSIS:  Fibroids, dysmenorrhea, anemia.  PROCEDURES: Total laparoscopic hysterectomy with bilateral salpingectomy, lysis of omental adhesions, cystoscopy  SURGEON: [name], M.D.  ASSISTANT:  Megan Salon, M.D.  ANESTHESIA: General endotracheal, intraperitoneal ropivicaine 30 mL diluted in 30 mL of normal saline, local with 0.25% Marcaine.  IVF:   1500 cc LR.   ESTIMATED BLOOD LOSS:   125 cc.   URINE OUTPUT:  700 cc.   COMPLICATIONS: None.  INDICATIONS FOR THE PROCEDURE:    The patient is a 41 year old Para 83 African American female status post bilateral tubal ligation, who presents with menorrhagia, dysmenorrhea, and anemia.  Pelvic ultrasound demonstrates multiple small fibroids and normal ovaries.  She has previously had a benign endometrial biopsy earlier this year.  Her hemoglogin is 9.6 and she is taking iron therapy.  She declines further medical therapy for her fibroids and future childbearing.  She is requesting hysterectomy procedure with removal of her fallopian tubes.  A plan is made to proceed with a total laparoscopic hysterectomy with bilateral salpingectomy, and cystoscopy after risks, benefits, and alternatives are reviewed.  FINDINGS:    Exam under anesthesia reveals an 8 week size uterus which is mobile.  No adnexal masses are appreciated.  Laparoscopy revealed a multifibroid uterus.  Her left fallopian tube was adherent to her left ovary, and her right fallopian tube appeared to be consistent with tubal ligation and old suture material of the right tube.  The bilateral ovaries were normal.  The upper abdomen was normal and demonstrated a normal liver.  There were adhesions of the omentum to the anterior abdominal wall below the umbilicus, and these were 100% lysed during the surgery.  There was a small Master's window of the right posterior cul de sac.   The appendix was retrocecal and the tip could not be visualized.   Cystoscopy at the termination of the procedure showed the bladder to be normal throughout 360 degrees including the bladder dome and trigone. There was no evidence of any foreign body in the bladder or the urethra. There was no evidence of any lesions  of the bladder or the urethra.  Both of the ureters were noted to be patent bilaterally.  SPECIMENS:    The uterus, cervix, and bilateral tubes were went to pathology.   DESCRIPTION OF PROCEDURE:   The patient was reidentified in the preoperative hold area.  She did receive  Cefotetan IV for antibiotic prophylaxis. She received TED hose and PAS stockings for DVT prophylaxis.  In the operating room, the patient was placed in the dorsal lithotomy position on the operating room table.  Her legs were placed in the Rutledge stirrups and her arms were both tucked at her sides. The patient received general endotracheal anesthesia. The abdomen and vagina were then sterilely prepped and she was sterilely draped.  A speculum was placed in the vagina and a single-tooth tenaculum was placed on the anterior cervical lip. A figure-of-eight suture of 0 Vicryl was placed on each the anterior and the posterior cervical lips. The uterus was sounded to 8 cm. The cervix was then dilated with [facility] dilators. A # 8 RUMI tip with a KOH ring was then placed through the cervix and into the uterine cavity without difficulty. The remaining vaginal instruments were then removed. A Foley catheter was placed inside the bladder.  Attention was turned to the abdomen where the umbilical region was injected with  0.25% Marcaine and a small incision created.  A Veress needle was then used to insufflate the abdomen with CO2 gas after a saline drop test was performed and the fluid flowed freely.  A 5 mm umbilical incision was created with a scalpel after the skin. A 5 mm camera port was then placed using the  Optiview. 5 mm incisions were then created in the left upper abdomen and the left lower abdomen after the skin was injected locally with 0.25% Marcaine.  The 5 mm trocars were then placed under visualization of the laparoscope.  An 8 mm trocar was similarly placed in the right lower quadrant after injecting with Marcaine and incising with a scalpel.   Ropivicine was placed inside the peritoneal cavity.   The patient was placed in Trendelenburg position. An inspection of the abdomen and pelvis was performed. The findings are as noted above.   A laparoscopic monopolar scissors was used to lyse the omental adhesions.  Hemostasis was good.   The bilateral ureters were identified.  The left fallopian tube adhesions were cauterized with monopolar cautery.  The tube then was grasped and the Ligasure was used to cauterize and cut through the mesosalpinx.  The left utero-ovarian ligament was similarly cauterized and cut with the Ligasure. The left round ligament was then cauterized and divided with same instrument. Dissection was performed to the anterior and posterior leaves of the broad ligaments using the monopolar laparoscopic scissors. The incision was carried across the anterior cul- de-sac along the vesicouterine fold and the bladder was dissected away from the cervix using monopolar cautery scissors. The peritoneum was taken down posteriorly. The left uterine artery was skeletonized at this time using sharp dissection and monopolar cautery.   It was then cauterized and cut with the Ligasure instrument.  Attention was turned to the patient's right-hand side at this time. The same procedure that was performed on the left side was repeated on the right side with respect to the isolation, cautery, and transection of the vessels and the bladder flap dissection.      The KOH ring was nicely visible. The colpotomy incision was performed with the laparoscopic monopolar scissors in a circumferential  fashion. The specimen was then removed from the peritoneal cavity and was sent to Pathology. The balloon occluder was placed in the vagina. The vaginal cuff was sutured at this time using a running suture of 0 V-Loc. The vagina was closed from the patient's right hand side to the left hand side and then back 2 sutures towards the midline. This provided good full-thickness closure of the vaginal cuff.  The laparoscopic needle for suturing was removed from the peritoneal cavity.  The pelvis was irrigated and suctioned.  The pneumoperitoneal was let down.  There was good hemostasis of the operative sites and pedicles.  The 8 mm trocar was removed and the [employer] instrument was used to close the fascia with a 0/0 vicryl suture.   The remaining left sided laparoscopic trocars were removed under visualization of the laparoscope. The CO2 pneumoperitoneum was released. The patient received manual breaths to remove any remaining CO2 gas and then the umbilical trocar was removed.  The patient's 3-way Foley catheter was removed at this time and cystoscopy was performed and the findings are as noted above. The Foley catheter was left out.   Final inspection of the vagina demonstrated good hemostasis of the vaginal cuff.  All skin incisions were closed with subcuticular sutures of 4-0 Vicryl. Dermabond was placed  over the incisions.  This concluded the patient's procedure. She was extubated and escorted to the recovery room in stable and awake condition. There were no complications to the procedure. All needle, instrument, and sponge counts were correct.

## 2017-06-12 ENCOUNTER — Encounter (HOSPITAL_COMMUNITY): Payer: Self-pay | Admitting: Obstetrics and Gynecology

## 2017-06-12 DIAGNOSIS — D251 Intramural leiomyoma of uterus: Secondary | ICD-10-CM | POA: Diagnosis not present

## 2017-06-12 LAB — CBC
HCT: 29.5 % — ABNORMAL LOW (ref 36.0–46.0)
HEMOGLOBIN: 9.2 g/dL — AB (ref 12.0–15.0)
MCH: 27 pg (ref 26.0–34.0)
MCHC: 31.2 g/dL (ref 30.0–36.0)
MCV: 86.5 fL (ref 78.0–100.0)
PLATELETS: 204 10*3/uL (ref 150–400)
RBC: 3.41 MIL/uL — AB (ref 3.87–5.11)
RDW: 18.9 % — ABNORMAL HIGH (ref 11.5–15.5)
WBC: 9.4 10*3/uL (ref 4.0–10.5)

## 2017-06-12 LAB — BASIC METABOLIC PANEL
ANION GAP: 4 — AB (ref 5–15)
BUN: 15 mg/dL (ref 6–20)
CO2: 24 mmol/L (ref 22–32)
Calcium: 8.4 mg/dL — ABNORMAL LOW (ref 8.9–10.3)
Chloride: 109 mmol/L (ref 101–111)
Creatinine, Ser: 0.86 mg/dL (ref 0.44–1.00)
Glucose, Bld: 117 mg/dL — ABNORMAL HIGH (ref 65–99)
POTASSIUM: 4.7 mmol/L (ref 3.5–5.1)
SODIUM: 137 mmol/L (ref 135–145)

## 2017-06-12 MED ORDER — IBUPROFEN 600 MG PO TABS: 600.0000 mg | ORAL_TABLET | Freq: Four times a day (QID) | ORAL | 0 refills | Status: DC | PRN

## 2017-06-12 MED ORDER — OXYCODONE-ACETAMINOPHEN 5-325 MG PO TABS: 1.0000 | ORAL_TABLET | ORAL | 0 refills | Status: DC | PRN

## 2017-06-12 NOTE — Progress Notes (Signed)
1 Day Post-Op Procedure(s) (LRB): TOTAL LAPAROSCOPIC HYSTERECTOMY WITH SALPINGECTOMY (Bilateral) CYSTOSCOPY (N/A) LYSIS OF OMENTAL ADHESIONS (N/A)  Subjective: Patient reports tolerating PO and no problems voiding.   Ambulating in halls.  No flatus.  Feels ready for discharge.  Objective: I have reviewed patient's vital signs, intake and output and labs. Vitals:   06/11/17 2355 06/12/17 0425  BP: 121/79 104/62  Pulse: 88 87  Resp: 18 18  Temp: 98.3 F (36.8 C) 99.1 F (37.3 C)  SpO2: 100% 100%   Hgb 9.2.  General: alert and cooperative Resp: clear to auscultation bilaterally Cardio: regular rate and rhythm, S1, S2 normal, no murmur, click, rub or gallop GI: soft, non-tender; bowel sounds normal; no masses,  no organomegaly and incision: clean, dry and intact Vaginal Bleeding: none  Assessment: s/p Procedure(s): TOTAL LAPAROSCOPIC HYSTERECTOMY WITH SALPINGECTOMY (Bilateral) CYSTOSCOPY (N/A) LYSIS OF OMENTAL ADHESIONS (N/A): progressing well  Ready for discharge.  Plan: Discharge home  Follow up in 6 days.  Instruction and precautions given.  Rx for Percocet and Motrin.   LOS: 0 days    Carol Logan 06/12/2017, 7:19 AM

## 2017-06-12 NOTE — Discharge Instructions (Signed)

## 2017-06-13 NOTE — Discharge Summary (Signed)
Physician Discharge Summary  Patient ID: Carol Logan MRN: [id number] DOB/AGE: 1976-01-30 41 y.o.  Admit date: 06/11/2017 Discharge date:  06/12/17  Admission Diagnoses: 1.  Uterine fibroids.  2.  Dysmenorrhea. 3.  Anemia.  Discharge Diagnoses:  1.  Uterine fibroids.  2.  Dysmenorrhea. 3.  Anemia. 4.  Status post total laparoscopic hysterectomy with bilateral salpingectomy, lysis of omental adhesions, cystoscopy.  Active Problems:   Status post laparoscopic hysterectomy   Discharged Condition: good  Hospital Course:  The patient was admitted on 06/11/17 for a total laparoscopic hysterectomy with bilateral salpingectomy, lysis of omental adhesions, cystoscopy which were performed without complication while under general anesthesia.  The patient's post op course was uneventful.  She received morphine and Toradol for pain control initially, and this was converted over to Percocet and Motrin when the patient began taking po well.  She ambulated independently and wore PAS and Ted hose for DVT prophylaxis while in bed.  She did receive Lovenox for DVT prophylaxis preop. The patient voided well post op. Her vital signs remained stable and she demonstrated no signs of infection during her hospitalization.  The patient's post op day one Hgb was 9.2.   She was tolerating the this well.  She had very minimal vaginal bleeding, and her incision(s) demonstrated no signs of erythema or significant drainage.  She was found to be in good condition and ready for discharge on post op day one.   Consults: None  Significant Diagnostic Studies: labs: See Hospital Course.  Treatments: surgery:  Total laparoscopic hysterectomy with bilateral salpingectomy, lysis of omental adhesions, cystoscopy.  Discharge Exam: Blood pressure 115/65, pulse 73, temperature 98.2 F (36.8 C), temperature source Oral, resp. rate 20, height 5\' 5"  (1.651 m), weight 167 lb (75.8 kg), last menstrual period 05/30/2017, SpO2  99 %. General: alert and cooperative Resp: clear to auscultation bilaterally Cardio: regular rate and rhythm, S1, S2 normal, no murmur, click, rub or gallop GI: soft, non-tender; bowel sounds normal; no masses,  no organomegaly and incision: clean, dry and intact Vaginal Bleeding: none   Disposition: 01-Home or Self Care Discharge instructions were reviewed in verba and written form.   Allergies as of 06/12/2017   No Known Allergies     Medication List    STOP taking these medications   aspirin-acetaminophen-caffeine 250-250-65 MG tablet Commonly known as:  EXCEDRIN MIGRAINE   ferrous sulfate 160 (50 Fe) MG Tbcr SR tablet Commonly known as:  SLOW FE   levonorgestrel-ethinyl estradiol 0.15-0.03 MG tablet Commonly known as:  SEASONALE,INTROVALE,JOLESSA     TAKE these medications   ferrous sulfate 325 (65 FE) MG EC tablet Take 325 mg 3 (three) times daily with meals by mouth.   ibuprofen 600 MG tablet Commonly known as:  ADVIL,MOTRIN Take 1 tablet (600 mg total) every 6 (six) hours as needed by mouth (mild pain).   MELATONIN PO Take 1 tablet at bedtime as needed by mouth (sleep).   multivitamin with minerals Tabs tablet Take 1 tablet daily by mouth.   oxyCODONE-acetaminophen 5-325 MG tablet Commonly known as:  PERCOCET/ROXICET Take 1-2 tablets every 4 (four) hours as needed by mouth for severe pain (moderate to severe pain (when tolerating fluids)).      Follow-up Information    Nunzio Cobbs, MD In 6 days.   Specialty:  Obstetrics and Gynecology Contact information: [address] [place] [address] [phone]           Signed: Hazel Sams  Quincy Simmonds 06/13/2017, 5:39 AM

## 2017-06-18 ENCOUNTER — Other Ambulatory Visit: Payer: Self-pay

## 2017-06-18 ENCOUNTER — Ambulatory Visit (INDEPENDENT_AMBULATORY_CARE_PROVIDER_SITE_OTHER): Payer: 59 | Admitting: Obstetrics and Gynecology

## 2017-06-18 ENCOUNTER — Encounter: Payer: Self-pay | Admitting: Obstetrics and Gynecology

## 2017-06-18 VITALS — BP 112/62 | HR 82 | Resp 16 | Wt 166.4 lb

## 2017-06-18 DIAGNOSIS — Z9889 Other specified postprocedural states: Secondary | ICD-10-CM

## 2017-06-18 NOTE — Progress Notes (Signed)
GYNECOLOGY  VISIT   HPI: 41 y.o.   Single  African American  female   [phone] with Patient's last menstrual period was 05/30/2017.   here for 1 week follow up TOTAL LAPAROSCOPIC HYSTERECTOMY WITH SALPINGECTOMY (Bilateral Abdomen) CYSTOSCOPY (N/A Bladder) LYSIS OF OMENTAL ADHESIONS (N/A Abdomen).   Stopped all pain medication 3 days ago.  Had a lot of nausea.   Using a heating pad and Tylenol.   Usually can take Aleve.   No vaginal bleeding.  Voiding well, BM returned, and eating well.   Feels tired.  Asking is she needs to restart her iron.    Final pathology - benign cervix, leiomyomata, adenomyosis, endometriosis.  GYNECOLOGIC HISTORY: Patient's last menstrual period was 05/30/2017. Contraception:  Tubal/Hysterectomy Menopausal hormone therapy:  none Last mammogram: 09/2016 with [facility]--normal per patient Last pap smear:   09/2016 normal - results in EPIC, 12-02-13 normal per pt.  Remote hx of abnormal pap and had cryo in her early 38s.         OB History    Gravida Para Term Preterm AB Living   4 2 2  0 2 2   SAB TAB Ectopic Multiple Live Births   0 2 0 0 2         Patient Active Problem List   Diagnosis Date Noted  . Status post laparoscopic hysterectomy 06/11/2017  . 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 Menorrhagia 09/11/2016  . Routine general medical examination at a health care facility 09/21/2015  . Hyperlipidemia 06/22/2014  . Obesity 06/17/2014  . Uterine leiomyoma 06/17/2014  . Allergic rhinitis 06/17/2014    Past Medical History:  Diagnosis Date  . Abnormal uterine bleeding   . Anemia   . Endometriosis   . Fibroid   . Fibroids   . Headache    with periods  . No pertinent past medical history   . Ovarian cyst   . SVD (spontaneous vaginal delivery)    x 2    Past Surgical History:  Procedure Laterality Date  . CERVICAL CERCLAGE N/A 04/01/2013   Procedure: CERCLAGE  CERVICAL;  Surgeon: [name], MD;  Location: [facility];  Service: Gynecology;  Laterality: N/A;  . CESAREAN SECTION N/A 09/18/2013   Procedure: CESAREAN SECTION;  Surgeon: [name], MD;  Location: [facility];  Service: Obstetrics;  Laterality: N/A;  . CYSTOSCOPY N/A 06/11/2017   Procedure: CYSTOSCOPY;  Surgeon: Nunzio Cobbs, MD;  Location: [facility];  Service: Gynecology;  Laterality: N/A;  . FOOT SURGERY Bilateral    buinonectomy  . FRACTURE SURGERY Right 1992   knee surgery  . LYSIS OF ADHESION N/A 06/11/2017   Procedure: LYSIS OF OMENTAL ADHESIONS;  Surgeon: Nunzio Cobbs, MD;  Location: [facility];  Service: Gynecology;  Laterality: N/A;  . MYOMECTOMY  10/11/2011   Procedure: MYOMECTOMY;  Surgeon: [name], MD;  Location: [facility];  Service: Gynecology;  Laterality: N/A;  . TOTAL LAPAROSCOPIC HYSTERECTOMY WITH SALPINGECTOMY Bilateral 06/11/2017   Procedure: TOTAL LAPAROSCOPIC HYSTERECTOMY WITH SALPINGECTOMY;  Surgeon: Nunzio Cobbs, MD;  Location: [facility];  Service: Gynecology;  Laterality: Bilateral;  . TUBAL LIGATION    . WISDOM TOOTH EXTRACTION      Current Outpatient Medications  Medication Sig Dispense Refill  . ferrous sulfate 325 (65 FE) MG EC tablet Take 325 mg 3 (three) times daily with meals by mouth.    Marland Kitchen  ibuprofen (ADVIL,MOTRIN) 600 MG tablet Take 1 tablet (600 mg total) every 6 (six) hours as needed by mouth (mild pain). 30 tablet 0  . MELATONIN PO Take 1 tablet at bedtime as needed by mouth (sleep).    . Multiple Vitamin (MULTIVITAMIN WITH MINERALS) TABS tablet Take 1 tablet daily by mouth.    . oxyCODONE-acetaminophen (PERCOCET/ROXICET) 5-325 MG tablet Take 1-2 tablets every 4 (four) hours as needed by mouth for severe pain (moderate to severe pain (when tolerating fluids)). 30 tablet 0   No current facility-administered medications for this visit.      ALLERGIES: Patient has no known allergies.  Family History   Problem Relation Age of Onset  . High blood pressure Mother   . Hypertension Mother   . Glaucoma Paternal Uncle   . Alzheimer's disease Paternal Uncle   . Hypertension Sister   . Other Sister        GYN complications  . Other Brother        Homicide  . Hypertension Maternal Grandmother   . Heart disease Maternal Grandmother   . Hypertension Paternal Grandmother   . Stroke Paternal Grandmother   . Stroke Paternal Grandfather   . Ovarian cancer Other   . Cancer Neg Hx   . Depression Neg Hx   . Diabetes Neg Hx   . Drug abuse Neg Hx   . Early death Neg Hx   . Alcohol abuse Neg Hx   . Hyperlipidemia Neg Hx   . Kidney disease Neg Hx     Social History   Socioeconomic History  . Marital status: Single    Spouse name: Not on file  . Number of children: 1  . Years of education: college  . Highest education level: Not on file  Social Needs  . Financial resource strain: Not on file  . Food insecurity - worry: Not on file  . Food insecurity - inability: Not on file  . Transportation needs - medical: Not on file  . Transportation needs - non-medical: Not on file  Occupational History  . 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 Smoking status: Never Smoker  . Smokeless tobacco: Never Used  Substance and Sexual Activity  . Alcohol use: No    Comment: socially- 1 glass of wine monthly  . Drug use: No  . Sexual activity: Yes    Partners: Male    Birth control/protection: Surgical    Comment: tubal  Other Topics Concern  . Not on file  Social History Narrative   Pt lives at home with her spouse and son.   Caffeine Use- Consumes coffee/tea twice a week    ROS:  Pertinent items are noted in HPI.  PHYSICAL EXAMINATION:    BP 112/62 (BP Location: Right Arm, Patient Position: Sitting, Cuff Size: Normal)   Pulse 82   Resp 16   Wt 166 lb 6.4 oz (75.5 kg)   LMP 05/30/2017   BMI 27.69 kg/m     General appearance:  alert, cooperative and appears stated age   Abdomen: incisions intact, abdomen is soft, non-tender, no masses,  no organomegaly  Chaperone was present for exam.  ASSESSMENT  Status post TLH, bilateral salpingectomy, cystoscopy for fibroids, adenomyosis, endometriosis. Hx anemia.   PLAN  Check CBC.  Try Aleve 2 po bid prn pain.  Continue decreased activity/pelvic rest. Surgical findings, procedure, and pathology report reviewed. Return for 6 week post op  visit.    An After Visit Summary was printed and given to the patient.

## 2017-06-19 LAB — CBC
HEMATOCRIT: 36.4 % (ref 34.0–46.6)
Hemoglobin: 11.4 g/dL (ref 11.1–15.9)
MCH: 26.3 pg — ABNORMAL LOW (ref 26.6–33.0)
MCHC: 31.3 g/dL — AB (ref 31.5–35.7)
MCV: 84 fL (ref 79–97)
PLATELETS: 255 10*3/uL (ref 150–379)
RBC: 4.34 x10E6/uL (ref 3.77–5.28)
RDW: 19.5 % — ABNORMAL HIGH (ref 12.3–15.4)
WBC: 6.5 10*3/uL (ref 3.4–10.8)

## 2017-07-09 NOTE — Telephone Encounter (Signed)
Ok to close encounter. 

## 2017-07-19 ENCOUNTER — Telehealth: Payer: Self-pay | Admitting: Obstetrics & Gynecology

## 2017-07-19 ENCOUNTER — Ambulatory Visit: Payer: 59 | Admitting: Obstetrics and Gynecology

## 2017-07-19 ENCOUNTER — Ambulatory Visit: Payer: 59 | Admitting: Obstetrics & Gynecology

## 2017-07-19 NOTE — Telephone Encounter (Signed)
Called and spoke with patient to reschedule her 6 week post op appointment for today that was cancelled due to an office power outage. Appointment rescheduled to Friday, 07/20/17, at 4:15 PM. Patient appreciative as she is concerned about returning to work soon.

## 2017-07-20 ENCOUNTER — Ambulatory Visit (INDEPENDENT_AMBULATORY_CARE_PROVIDER_SITE_OTHER): Payer: 59 | Admitting: Obstetrics & Gynecology

## 2017-07-20 ENCOUNTER — Encounter: Payer: Self-pay | Admitting: Obstetrics & Gynecology

## 2017-07-20 ENCOUNTER — Ambulatory Visit: Payer: 59 | Admitting: Obstetrics and Gynecology

## 2017-07-20 VITALS — BP 110/62 | HR 88 | Resp 16 | Ht 65.0 in | Wt 165.0 lb

## 2017-07-20 DIAGNOSIS — Z9889 Other specified postprocedural states: Secondary | ICD-10-CM

## 2017-07-20 DIAGNOSIS — D219 Benign neoplasm of connective and other soft tissue, unspecified: Secondary | ICD-10-CM

## 2017-07-20 DIAGNOSIS — N920 Excessive and frequent menstruation with regular cycle: Secondary | ICD-10-CM

## 2017-07-20 NOTE — Progress Notes (Signed)
Post Operative Visit  Procedure: Total laparoscopic hysterectomy with Salpingectomy, cystoscopy, lysis of omental adhesions  Days Post-op:  44  Subjective: Doing well.  Took narcotics about two weeks and motrin another week or so.  Nothing in about three weeks.  Denies vaginal bleeding or discharge.  States she feels better than she has in several years.  Ready to go back to work.  Has not been SA.  Reviewed 8 weeks of pelvic rest and work requirements.  Feels she is ready for full time work.  Bladder and bowel function is normal.    Objective: BP 110/62 (BP Location: Right Arm, Patient Position: Sitting, Cuff Size: Normal)   Pulse 88   Resp 16   Ht 5\' 5"  (1.651 m)   Wt 165 lb (74.8 kg)   LMP 05/30/2017   BMI 27.46 kg/m   EXAM General: alert, cooperative and no distress Resp: clear to auscultation bilaterally Cardio: regular rate and rhythm, S1, S2 normal, no murmur, click, rub or gallop GI: soft, non-tender; bowel sounds normal; no masses,  no organomegaly and incision: clean, dry and intact Extremities: extremities normal, atraumatic, no cyanosis or edema Vaginal Bleeding: none  Gyn:  NAEFG, vagina without lesions/masses, cuff healing well, no visible or palpable sutures, cuff without masses, firmness  Assessment: s/p TLH/bilateral salpingectomy/cystoscopy  Plan: Recheck 10/19 for AEX with Dr. Quincy Simmonds.  Advised to call with any concerns/questions. Pt is released to return to full time work without restrictions starting 07/23/17 Pt is reminded of pelvic rest restriction and no heavy lifting restriction for 8 full weeks post op.

## 2017-08-04 ENCOUNTER — Other Ambulatory Visit: Payer: Self-pay

## 2017-08-04 ENCOUNTER — Encounter (HOSPITAL_COMMUNITY): Payer: Self-pay | Admitting: *Deleted

## 2017-08-04 ENCOUNTER — Inpatient Hospital Stay (HOSPITAL_COMMUNITY)
Admission: AD | Admit: 2017-08-04 | Discharge: 2017-08-05 | Disposition: A | Discharge status: Home / Self Care | Payer: 59 | Source: Ambulatory Visit | Attending: Obstetrics and Gynecology | Admitting: Obstetrics and Gynecology

## 2017-08-04 DIAGNOSIS — K8000 Calculus of gallbladder with acute cholecystitis without obstruction: Secondary | ICD-10-CM | POA: Diagnosis not present

## 2017-08-04 DIAGNOSIS — Z90710 Acquired absence of both cervix and uterus: Secondary | ICD-10-CM | POA: Diagnosis not present

## 2017-08-04 DIAGNOSIS — K8062 Calculus of gallbladder and bile duct with acute cholecystitis without obstruction: Secondary | ICD-10-CM

## 2017-08-04 DIAGNOSIS — G8918 Other acute postprocedural pain: Secondary | ICD-10-CM | POA: Diagnosis present

## 2017-08-04 LAB — COMPREHENSIVE METABOLIC PANEL
ALT: 12 U/L — AB (ref 14–54)
AST: 18 U/L (ref 15–41)
Albumin: 4.1 g/dL (ref 3.5–5.0)
Alkaline Phosphatase: 68 U/L (ref 38–126)
Anion gap: 8 (ref 5–15)
BUN: 13 mg/dL (ref 6–20)
CHLORIDE: 104 mmol/L (ref 101–111)
CO2: 24 mmol/L (ref 22–32)
CREATININE: 0.86 mg/dL (ref 0.44–1.00)
Calcium: 9.2 mg/dL (ref 8.9–10.3)
GFR calc Af Amer: >60 mL/min (ref >60)
GFR calc non Af Amer: >60 mL/min (ref >60)
GLUCOSE: 113 mg/dL — AB (ref 65–99)
Potassium: 5 mmol/L (ref 3.5–5.1)
Sodium: 136 mmol/L (ref 135–145)
Total Bilirubin: 0.3 mg/dL (ref 0.3–1.2)
Total Protein: 7.8 g/dL (ref 6.5–8.1)

## 2017-08-04 LAB — CBC WITH DIFFERENTIAL/PLATELET
BASOS ABS: 0 10*3/uL (ref 0.0–0.1)
Basophils Relative: 0 %
EOS ABS: 0 10*3/uL (ref 0.0–0.7)
EOS PCT: 0 %
HCT: 37.7 % (ref 36.0–46.0)
Hemoglobin: 12 g/dL (ref 12.0–15.0)
LYMPHS PCT: 27 %
Lymphs Abs: 2 10*3/uL (ref 0.7–4.0)
MCH: 26.4 pg (ref 26.0–34.0)
MCHC: 31.8 g/dL (ref 30.0–36.0)
MCV: 83 fL (ref 78.0–100.0)
Monocytes Absolute: 0 10*3/uL — ABNORMAL LOW (ref 0.1–1.0)
Monocytes Relative: 0 %
Neutro Abs: 5.5 10*3/uL (ref 1.7–7.7)
Neutrophils Relative %: 73 %
PLATELETS: 211 10*3/uL (ref 150–400)
RBC: 4.54 MIL/uL (ref 3.87–5.11)
RDW: 15.7 % — ABNORMAL HIGH (ref 11.5–15.5)
WBC: 7.6 10*3/uL (ref 4.0–10.5)

## 2017-08-04 LAB — URINALYSIS, ROUTINE W REFLEX MICROSCOPIC
BILIRUBIN URINE: NEGATIVE
Glucose, UA: NEGATIVE mg/dL
HGB URINE DIPSTICK: NEGATIVE
Ketones, ur: NEGATIVE mg/dL
Leukocytes, UA: NEGATIVE
NITRITE: NEGATIVE
PROTEIN: NEGATIVE mg/dL
Specific Gravity, Urine: 1.017 (ref 1.005–1.030)
pH: 7 (ref 5.0–8.0)

## 2017-08-04 NOTE — MAU Note (Addendum)
Had lap assist hysterectomy 11/19. Has had occ where had this pain after surgery and mentioned to doctor. Was told just adjusting after surgery. Today pain in abdomen is worse for 4hrs. Vomited 3 times no diarrhea. Has this pain daily as day goes on but usually resolves after hour or so at night. Stomach bloated today

## 2017-08-04 NOTE — MAU Provider Note (Signed)
Chief Complaint: postoperative pain   First Provider Initiated Contact with Patient 08/04/17 2320      SUBJECTIVE HPI: Carol Logan is a 42 y.o. [id number] at 8 weeks post-op TLH/BS on 06/11/17 who presents to [facility] reporting severe abd pain since this afternoon. Has has similar pain since the surgery, btu it has been relatively mild and only last about 1 hour. Talked to Dr. Quincy Simmonds about it and she attributed it to normal post-op pain.  This afternoon around 4:00 she started having 10/10 sharp low abd pain and generalized abd cramping and aching that brought her to tears. Pain has decreased slightly, but is still severe.   Location: Generalized abd Quality: See above Severity: 10/10 on pain scale at worst, 7/10 now Duration: 8 weeks Context: Post-op Course: Worsening Timing: constant Modifying factors: Hasn't tried anything for the pain. Is not longer taking Percocet and IBU that were Rx'd after surgery. Worse when lying on right side.  Associated signs and symptoms: Pos for N/V x 3 and two loose stools today and bloating. Neg for change in appetite, fever, chills, constipation, urinary complaints, VB, vaginal discharge.  Past Medical History:  Diagnosis Date  . Abnormal uterine bleeding   . Anemia   . Endometriosis   . Fibroid   . Fibroids   . Headache    with periods  . No pertinent past medical history   . Ovarian cyst   . SVD (spontaneous vaginal delivery)    x 2   OB History  Gravida Para Term Preterm AB Living  4 2 2  0 2 2  SAB TAB Ectopic Multiple Live Births  0 2 0 0 2    # Outcome Date GA Lbr Len/2nd Weight Sex Delivery Anes PTL Lv  4 Term 09/18/13 [redacted]w[redacted]d  7 lb 1 oz (3.204 kg) M CS-LTranv Spinal  LIV  3 Term      Vag-Spont   LIV  2 TAB           1 TAB              Past Surgical History:  Procedure Laterality Date  . ABDOMINAL HYSTERECTOMY    . CERVICAL CERCLAGE N/A 04/01/2013   Procedure: CERCLAGE CERVICAL;  Surgeon: [name], MD;   Location: [facility];  Service: Gynecology;  Laterality: N/A;  . CESAREAN SECTION N/A 09/18/2013   Procedure: CESAREAN SECTION;  Surgeon: [name], MD;  Location: [facility];  Service: Obstetrics;  Laterality: N/A;  . CYSTOSCOPY N/A 06/11/2017   Procedure: CYSTOSCOPY;  Surgeon: Nunzio Cobbs, MD;  Location: [facility];  Service: Gynecology;  Laterality: N/A;  . FOOT SURGERY Bilateral    buinonectomy  . FRACTURE SURGERY Right 1992   knee surgery  . LYSIS OF ADHESION N/A 06/11/2017   Procedure: LYSIS OF OMENTAL ADHESIONS;  Surgeon: Nunzio Cobbs, MD;  Location: [facility];  Service: Gynecology;  Laterality: N/A;  . MYOMECTOMY  10/11/2011   Procedure: MYOMECTOMY;  Surgeon: [name], MD;  Location: [facility];  Service: Gynecology;  Laterality: N/A;  . TOTAL LAPAROSCOPIC HYSTERECTOMY WITH SALPINGECTOMY Bilateral 06/11/2017   Procedure: TOTAL LAPAROSCOPIC HYSTERECTOMY WITH SALPINGECTOMY;  Surgeon: Nunzio Cobbs, MD;  Location: [facility];  Service: Gynecology;  Laterality: Bilateral;  . TUBAL LIGATION    . WISDOM TOOTH EXTRACTION     Social History   Socioeconomic History  . Marital status: Single    Spouse name: Not on file  .  Number of children: 1  . Years of education: college  . Highest education level: Not on file  Social Needs  . Financial resource strain: Not on file  . Food insecurity - worry: Not on file  . Food insecurity - inability: Not on file  . Transportation needs - medical: Not on file  . Transportation needs - non-medical: Not on file  Occupational History  . 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 Smoking status: Never Smoker  . Smokeless tobacco: Never Used  Substance and Sexual Activity  . Alcohol use: No    Comment: socially- 1 glass of wine monthly  . Drug use: No  . Sexual activity: Yes    Partners: Male    Birth control/protection: Surgical    Comment: TLH  Other  Topics Concern  . Not on file  Social History Narrative   Pt lives at home with her spouse and son.   Caffeine Use- Consumes coffee/tea twice a week   Family History  Problem Relation Age of Onset  . High blood pressure Mother   . Hypertension Mother   . Glaucoma Paternal Uncle   . Alzheimer's disease Paternal Uncle   . Hypertension Sister   . Other Sister        GYN complications  . Other Brother        Homicide  . Hypertension Maternal Grandmother   . Heart disease Maternal Grandmother   . Hypertension Paternal Grandmother   . Stroke Paternal Grandmother   . Stroke Paternal Grandfather   . Ovarian cancer Other   . Cancer Neg Hx   . Depression Neg Hx   . Diabetes Neg Hx   . Drug abuse Neg Hx   . Early death Neg Hx   . Alcohol abuse Neg Hx   . Hyperlipidemia Neg Hx   . Kidney disease Neg Hx    No current facility-administered medications on file prior to encounter.    Current Outpatient Medications on File Prior to Encounter  Medication Sig Dispense Refill  . Multiple Vitamin (MULTIVITAMIN WITH MINERALS) TABS tablet Take 1 tablet daily by mouth.     No Known Allergies  I have reviewed patient's Past Medical Hx, Surgical Hx, Family Hx, Social Hx, medications and allergies.   Review of Systems  OBJECTIVE Patient Vitals for the past 24 hrs:  BP Temp Pulse Height Weight  08/04/17 2157 135/84 98.8 F (37.1 C) 82 5\' 5"  (1.651 m) 166 lb (75.3 kg)   Constitutional: Well-developed, well-nourished female in mild distress.  Cardiovascular: normal rate Respiratory: normal rate and effort.  GI: Abd soft, mild-mod TTP, mostly immediately below umbilicus. Pos BS x 4. Neg for rebaound tenderness or mass. Mild guarding. Neg tenderness at McBurney's point. Neg Murphy's sign.  MS: Extremities nontender, no edema, normal ROM Neurologic: Alert and oriented x 4.  GU: Neg CVAT.  BIMANUAL EXAM: NEFG, physiologic discharge, no blood noted, cervix surgically absent. Mod tenderness on  exam, primary midline. No guarding on pelvic exam.  LAB RESULTS Results for orders placed or performed during the hospital encounter of 08/04/17 (from the past 24 hour(s))  Urinalysis, Routine w reflex microscopic     Status: Abnormal   Collection Time: 08/04/17 10:05 PM  Result Value Ref Range   Color, Urine YELLOW YELLOW   APPearance HAZY (A) CLEAR   Specific Gravity, Urine 1.017 1.005 - 1.030   pH 7.0 5.0 - 8.0  Glucose, UA NEGATIVE NEGATIVE mg/dL   Hgb urine dipstick NEGATIVE NEGATIVE   Bilirubin Urine NEGATIVE NEGATIVE   Ketones, ur NEGATIVE NEGATIVE mg/dL   Protein, ur NEGATIVE NEGATIVE mg/dL   Nitrite NEGATIVE NEGATIVE   Leukocytes, UA NEGATIVE NEGATIVE  CBC with Differential/Platelet     Status: Abnormal   Collection Time: 08/04/17 10:38 PM  Result Value Ref Range   WBC 7.6 4.0 - 10.5 K/uL   RBC 4.54 3.87 - 5.11 MIL/uL   Hemoglobin 12.0 12.0 - 15.0 g/dL   HCT 37.7 36.0 - 46.0 %   MCV 83.0 78.0 - 100.0 fL   MCH 26.4 26.0 - 34.0 pg   MCHC 31.8 30.0 - 36.0 g/dL   RDW 15.7 (H) 11.5 - 15.5 %   Platelets 211 150 - 400 K/uL   Neutrophils Relative % 73 %   Neutro Abs 5.5 1.7 - 7.7 K/uL   Lymphocytes Relative 27 %   Lymphs Abs 2.0 0.7 - 4.0 K/uL   Monocytes Relative 0 %   Monocytes Absolute 0.0 (L) 0.1 - 1.0 K/uL   Eosinophils Relative 0 %   Eosinophils Absolute 0.0 0.0 - 0.7 K/uL   Basophils Relative 0 %   Basophils Absolute 0.0 0.0 - 0.1 K/uL  Comprehensive metabolic panel     Status: Abnormal   Collection Time: 08/04/17 10:38 PM  Result Value Ref Range   Sodium 136 135 - 145 mmol/L   Potassium 5.0 3.5 - 5.1 mmol/L   Chloride 104 101 - 111 mmol/L   CO2 24 22 - 32 mmol/L   Glucose, Bld 113 (H) 65 - 99 mg/dL   BUN 13 6 - 20 mg/dL   Creatinine, Ser 0.86 0.44 - 1.00 mg/dL   Calcium 9.2 8.9 - 10.3 mg/dL   Total Protein 7.8 6.5 - 8.1 g/dL   Albumin 4.1 3.5 - 5.0 g/dL   AST 18 15 - 41 U/L   ALT 12 (L) 14 - 54 U/L   Alkaline Phosphatase 68 38 - 126 U/L   Total  Bilirubin 0.3 0.3 - 1.2 mg/dL   GFR calc non Af Amer >60 >60 mL/min   GFR calc Af Amer >60 >60 mL/min   Anion gap 8 5 - 15  Amylase     Status: None   Collection Time: 08/04/17 10:38 PM  Result Value Ref Range   Amylase 70 28 - 100 U/L  Lipase, blood     Status: None   Collection Time: 08/04/17 10:38 PM  Result Value Ref Range   Lipase 23 11 - 51 U/L    IMAGING Ct Abdomen Pelvis W Contrast  Result Date: 08/05/2017 CLINICAL DATA:  42 year old female with abdominal pain, nausea vomiting. Total laparoscopic hysterectomy on 06/11/2017. EXAM: CT ABDOMEN AND PELVIS WITH CONTRAST TECHNIQUE: Multidetector CT imaging of the abdomen and pelvis was performed using the standard protocol following bolus administration of intravenous contrast. CONTRAST:  164mL ISOVUE-300 IOPAMIDOL (ISOVUE-300) INJECTION 61% COMPARISON:  Pelvic ultrasound dated 05/24/2017 FINDINGS: Lower chest: The visualized lung bases are clear. No intra-abdominal free air or free fluid. Hepatobiliary: The liver is unremarkable. No intrahepatic biliary duct dilatation. The gallbladder is mildly distended. There are multiple large stones in the gallbladder each measuring approximately 2 cm and extending from the gallbladder fundus into the gallbladder neck. Smaller stones noted in the gallbladder fundus. There may be mild thickening of the gallbladder wall and mild pericholecystic stranding. Ultrasound is recommended for better evaluation of possible early acute cholecystitis. There is a 3  mm stone in the central CBD at the head of the pancreas (coronal image 49). Pancreas: Unremarkable. No pancreatic ductal dilatation or surrounding inflammatory changes. Spleen: Normal in size without focal abnormality. Adrenals/Urinary Tract: Adrenal glands are unremarkable. Kidneys are normal, without renal calculi, focal lesion, or hydronephrosis. Bladder is unremarkable. Stomach/Bowel: Moderate stool noted throughout the colon. There is no bowel obstruction  or active inflammation. Normal appendix. Vascular/Lymphatic: Mild atherosclerotic disease. The abdominal aorta and IVC are otherwise unremarkable. No portal venous gas. No adenopathy. Reproductive: Hysterectomy. The left ovary is unremarkable. There is a 2.2 x 1.5 cm dominant follicle or corpus luteum in the right ovary. Other: None Musculoskeletal: No acute or significant osseous findings. IMPRESSION: 1. Multiple large stones in the gallbladder as well as a 3 mm calculus in the central CBD. Probable trace pericholecystic fluid. Further evaluation with ultrasound recommended to evaluate for possibility of early cholecystitis. 2. Moderate colonic stool burden. No bowel obstruction or active inflammation. Normal appendix. 3. Hysterectomy. A 2.2 cm dominant follicle or corpus luteum in the right ovary. Electronically Signed   By: Anner Crete M.D.   On: 08/05/2017 03:11    MAU COURSE Orders Placed This Encounter  Procedures  . CT Abdomen Pelvis W Contrast  . Urinalysis, Routine w reflex microscopic  . CBC with Differential/Platelet  . Comprehensive metabolic panel  . Amylase  . Lipase, blood  . Insert peripheral IV   Meds ordered this encounter  Medications  . 0.9 %  sodium chloride infusion  . ondansetron (ZOFRAN) injection 4 mg  . ketorolac (TORADOL) 30 MG/ML injection 30 mg   Discussed Hx, labs, exam w/ Dr. Talbert Nan. Agrees w/ POC. New orders: CT Abd Pelvis.   Discussed CT results w/ Dr. Talbert Nan. New orders: Consult General Surgery. Consulted Dr. Harlow Asa. Reviewed Hx, exam, Labs, CT. Pt does not need transfer to ED tonight. Should F/U w/ GI in next 1-2 weeks and will then need Cholecystectomy 2/2 common bile duct stone. Dr. Talbert Nan notified. Pt can call [facility] office Monday morning for referral.   MDM -  ASSESSMENT 1. Calculus of gallbladder and bile duct with acute cholecystitis without obstruction   2. S/P laparoscopic hysterectomy     PLAN Discharge home in stable  condition. Cholecystitis precautions Rx Percocet, Zofran Miralax PRN for constipation/bloating.   Follow-up Information    [facility] Follow up.   Specialty:  Gastroenterology Why:  Call Dr. Elza Rafter office Monday morning to arrange referral to Gastroenterologist Contact information: [place] [id number] [facility] Follow up.   Specialty:  Emergency Medicine Why:  as needed if symptoms worsen Contact information: [place] [id number] [place] [address] [phone]          Allergies as of 08/05/2017   No Known Allergies     Medication List    TAKE these medications   multivitamin with minerals Tabs tablet Take 1 tablet daily by mouth.   ondansetron 4 MG tablet Commonly known as:  ZOFRAN Take 1 tablet (4 mg total) by mouth every 8 (eight) hours as needed for nausea or vomiting.   oxyCODONE-acetaminophen 5-325 MG tablet Commonly known as:  PERCOCET/ROXICET Take 1-2 tablets by mouth every 6 (six) hours as needed.   polyethylene glycol powder powder Commonly known as:  GLYCOLAX/MIRALAX Take 17 g by mouth daily as needed for moderate constipation.        Tamala Julian, [place], [place] 08/05/2017  12:55 AM

## 2017-08-05 ENCOUNTER — Telehealth: Payer: Self-pay | Admitting: Obstetrics and Gynecology

## 2017-08-05 ENCOUNTER — Encounter (HOSPITAL_COMMUNITY): Payer: Self-pay | Admitting: Radiology

## 2017-08-05 ENCOUNTER — Inpatient Hospital Stay (HOSPITAL_COMMUNITY): Payer: 59

## 2017-08-05 DIAGNOSIS — Z90710 Acquired absence of both cervix and uterus: Secondary | ICD-10-CM

## 2017-08-05 DIAGNOSIS — K8062 Calculus of gallbladder and bile duct with acute cholecystitis without obstruction: Secondary | ICD-10-CM

## 2017-08-05 DIAGNOSIS — K8050 Calculus of bile duct without cholangitis or cholecystitis without obstruction: Secondary | ICD-10-CM

## 2017-08-05 DIAGNOSIS — K8020 Calculus of gallbladder without cholecystitis without obstruction: Secondary | ICD-10-CM

## 2017-08-05 LAB — AMYLASE: AMYLASE: 70 U/L (ref 28–100)

## 2017-08-05 LAB — LIPASE, BLOOD: LIPASE: 23 U/L (ref 11–51)

## 2017-08-05 MED ORDER — ONDANSETRON HCL 4 MG PO TABS: 4.0000 mg | ORAL_TABLET | Freq: Three times a day (TID) | ORAL | 0 refills | Status: AC | PRN

## 2017-08-05 MED ORDER — KETOROLAC TROMETHAMINE 30 MG/ML IJ SOLN
30.0000 mg | Freq: Once | INTRAMUSCULAR | Status: AC
  Administered 2017-08-05: 30 mg via INTRAVENOUS
  Filled 2017-08-05: qty 1

## 2017-08-05 MED ORDER — SODIUM CHLORIDE 0.9 % IV SOLN
INTRAVENOUS | Status: DC
  Administered 2017-08-05: 01:00:00 via INTRAVENOUS

## 2017-08-05 MED ORDER — IOPAMIDOL (ISOVUE-300) INJECTION 61%
100.0000 mL | Freq: Once | INTRAVENOUS | Status: AC | PRN
  Administered 2017-08-05: 100 mL via INTRAVENOUS

## 2017-08-05 MED ORDER — ONDANSETRON HCL 4 MG/2ML IJ SOLN
4.0000 mg | Freq: Once | INTRAMUSCULAR | Status: AC | PRN
  Administered 2017-08-05: 4 mg via INTRAVENOUS
  Filled 2017-08-05: qty 2

## 2017-08-05 MED ORDER — POLYETHYLENE GLYCOL 3350 17 GM/SCOOP PO POWD: 17.0000 g | Freq: Every day | ORAL | 2 refills | Status: AC | PRN

## 2017-08-05 MED ORDER — OXYCODONE-ACETAMINOPHEN 5-325 MG PO TABS
1.0000 | ORAL_TABLET | Freq: Four times a day (QID) | ORAL | 0 refills | Status: DC | PRN
Start: 2017-08-05 — End: 2017-08-17

## 2017-08-05 NOTE — Telephone Encounter (Signed)
This patient is 2 months s/p TLH/BS. She presented to the MAU this weekend with abdominal pain. Evaluation revealed gallstones and a large stone in her common bile duct. Recommendation is for her to see GI in the next 1-2 weeks, then surgery consult for cholecystectomy. She will need to have the common bile duct stone removed prior to surgery. Please call [facility] and set her up for an appointment in the next 1-2 weeks.

## 2017-08-05 NOTE — Progress Notes (Signed)
Bimanual exam

## 2017-08-05 NOTE — Progress Notes (Signed)
Marlou Porch CNM in earlier to discuss test results and d/c plan. Written and verbal d/c instructions given and understanding voiced

## 2017-08-05 NOTE — Discharge Instructions (Signed)
Cholelithiasis °Cholelithiasis is a form of gallbladder disease in which gallstones form in the gallbladder. The gallbladder is an organ that stores bile. Bile is made in the liver, and it helps to digest fats. Gallstones begin as small crystals and slowly grow into stones. They may cause no symptoms until the gallbladder tightens (contracts) and a gallstone is blocking the duct (gallbladder attack), which can cause pain. Cholelithiasis is also referred to as gallstones. °There are two main types of gallstones: °· Cholesterol stones. These are made of hardened cholesterol and are usually yellow-green in color. They are the most common type of gallstone. Cholesterol is a white, waxy, fat-like substance that is made in the liver. °· Pigment stones. These are dark in color and are made of a red-yellow substance that forms when hemoglobin from red blood cells breaks down (bilirubin). ° °What are the causes? °This condition may be caused by an imbalance in the substances that bile is made of. This can happen if the bile: °· Has too much bilirubin. °· Has too much cholesterol. °· Does not have enough bile salts. These salts help the body absorb and digest fats. ° °In some cases, this condition can also be caused by the gallbladder not emptying completely or often enough. °What increases the risk? °The following factors may make you more likely to develop this condition: °· Being female. °· Having multiple pregnancies. Health care providers sometimes advise removing diseased gallbladders before future pregnancies. °· Eating a diet that is heavy in fried foods, fat, and refined carbohydrates, like white bread and white rice. °· Being obese. °· Being older than age 40. °· Prolonged use of medicines that contain female hormones (estrogen). °· Having diabetes mellitus. °· Rapidly losing weight. °· Having a family history of gallstones. °· Being of American Indian or Mexican descent. °· Having an intestinal disease such as  Crohn disease. °· Having metabolic syndrome. °· Having cirrhosis. °· Having severe types of anemia such as sickle cell anemia. ° °What are the signs or symptoms? °In most cases, there are no symptoms. These are known as silent gallstones. If a gallstone blocks the bile ducts, it can cause a gallbladder attack. The main symptom of a gallbladder attack is sudden pain in the upper right abdomen. The pain usually comes at night or after eating a large meal. The pain can last for one or several hours and can spread to the right shoulder or chest. °If the bile duct is blocked for more than a few hours, it can cause infection or inflammation of the gallbladder, liver, or pancreas, which may cause: °· Nausea. °· Vomiting. °· Abdominal pain that lasts for 5 hours or more. °· Fever or chills. °· Yellowing of the skin or the whites of the eyes (jaundice). °· Dark urine. °· Light-colored stools. ° °How is this diagnosed? °This condition may be diagnosed based on: °· A physical exam. °· Your medical history. °· An ultrasound of your gallbladder. °· CT scan. °· MRI. °· Blood tests to check for signs of infection or inflammation. °· A scan of your gallbladder and bile ducts (biliary system) using nonharmful radioactive material and special cameras that can see the radioactive material (cholescintigram). This test checks to see how your gallbladder contracts and whether bile ducts are blocked. °· Inserting a small tube with a camera on the end (endoscope) through your mouth to inspect bile ducts and check for blockages (endoscopic retrograde cholangiopancreatogram). ° °How is this treated? °Treatment for gallstones depends on the   severity of the condition. Silent gallstones do not need treatment. If the gallstones cause a gallbladder attack or other symptoms, treatment may be required. Options for treatment include:  Surgery to remove the gallbladder (cholecystectomy). This is the most common treatment.  Medicines to dissolve  gallstones. These are most effective at treating small gallstones. You may need to take medicines for up to 6-12 months.  Shock wave treatment (extracorporeal biliary lithotripsy). In this treatment, an ultrasound machine sends shock waves to the gallbladder to break gallstones into smaller pieces. These pieces can then be passed into the intestines or be dissolved by medicine. This is rarely used.  Removing gallstones through endoscopic retrograde cholangiopancreatogram. A small basket can be attached to the endoscope and used to capture and remove gallstones.  Follow these instructions at home:  Take over-the-counter and prescription medicines only as told by your health care provider.  Maintain a healthy weight and follow a healthy diet. This includes: ? Reducing fatty foods, such as fried food. ? Reducing refined carbohydrates, like white bread and white rice. ? Increasing fiber. Aim for foods like almonds, fruit, and beans.  Keep all follow-up visits as told by your health care provider. This is important. Contact a health care provider if:  You think you have had a gallbladder attack.  You have been diagnosed with silent gallstones and you develop abdominal pain or indigestion. Get help right away if:  You have pain from a gallbladder attack that lasts for more than 2 hours.  You have abdominal pain that lasts for more than 5 hours.  You have a fever or chills.  You have persistent nausea and vomiting.  You develop jaundice.  You have dark urine or light-colored stools. Summary  Cholelithiasis (also called gallstones) is a form of gallbladder disease in which gallstones form in the gallbladder.  This condition is caused by an imbalance in the substances that make up bile. This can happen if the bile has too much cholesterol, too much bilirubin, or not enough bile salts.  You are more likely to develop this condition if you are female, pregnant, using medicines with  estrogen, obese, older than age 40, or have a family history of gallstones. You may also develop gallstones if you have diabetes, an intestinal disease, cirrhosis, or metabolic syndrome.  Treatment for gallstones depends on the severity of the condition. Silent gallstones do not need treatment.  If gallstones cause a gallbladder attack or other symptoms, treatment may be needed. The most common treatment is surgery to remove the gallbladder. This information is not intended to replace advice given to you by your health care provider. Make sure you discuss any questions you have with your health care provider. Document Released: 07/06/2005 Document Revised: 03/26/2016 Document Reviewed: 03/26/2016 Elsevier Interactive Patient Education  2018 [employer] Laparoscopic Cholecystectomy Laparoscopic cholecystectomy is surgery to remove the gallbladder. The gallbladder is a pear-shaped organ that lies beneath the liver on the right side of the body. The gallbladder stores bile, which is a fluid that helps the body to digest fats. Cholecystectomy is often done for inflammation of the gallbladder (cholecystitis). This condition is usually caused by a buildup of gallstones (cholelithiasis) in the gallbladder. Gallstones can block the flow of bile, which can result in inflammation and pain. In severe cases, emergency surgery may be required. This procedure is done though small incisions in your abdomen (laparoscopic surgery). A thin scope with a camera (laparoscope) is inserted through one incision. Thin surgical instruments are inserted   through the other incisions. In some cases, a laparoscopic procedure may be turned into a type of surgery that is done through a larger incision (open surgery). Tell a health care provider about:  Any allergies you have.  All medicines you are taking, including vitamins, herbs, eye drops, creams, and over-the-counter medicines.  Any problems you or family members have had  with anesthetic medicines.  Any blood disorders you have.  Any surgeries you have had.  Any medical conditions you have.  Whether you are pregnant or may be pregnant. What are the risks? Generally, this is a safe procedure. However, problems may occur, including:  Infection.  Bleeding.  Allergic reactions to medicines.  Damage to other structures or organs.  A stone remaining in the common bile duct. The common bile duct carries bile from the gallbladder into the small intestine.  A bile leak from the cyst duct that is clipped when your gallbladder is removed.  What happens before the procedure? Staying hydrated Follow instructions from your health care provider about hydration, which may include:  Up to 2 hours before the procedure - you may continue to drink clear liquids, such as water, clear fruit juice, black coffee, and plain tea.  Eating and drinking restrictions Follow instructions from your health care provider about eating and drinking, which may include:  8 hours before the procedure - stop eating heavy meals or foods such as meat, fried foods, or fatty foods.  6 hours before the procedure - stop eating light meals or foods, such as toast or cereal.  6 hours before the procedure - stop drinking milk or drinks that contain milk.  2 hours before the procedure - stop drinking clear liquids.  Medicines  Ask your health care provider about: ? Changing or stopping your regular medicines. This is especially important if you are taking diabetes medicines or blood thinners. ? Taking medicines such as aspirin and ibuprofen. These medicines can thin your blood. Do not take these medicines before your procedure if your health care provider instructs you not to.  You may be given antibiotic medicine to help prevent infection. General instructions  Let your health care provider know if you develop a cold or an infection before surgery.  Plan to have someone take you  home from the hospital or clinic.  Ask your health care provider how your surgical site will be marked or identified. What happens during the procedure?  To reduce your risk of infection: ? Your health care team will wash or sanitize their hands. ? Your skin will be washed with soap. ? Hair may be removed from the surgical area.  An IV tube may be inserted into one of your veins.  You will be given one or more of the following: ? A medicine to help you relax (sedative). ? A medicine to make you fall asleep (general anesthetic).  A breathing tube will be placed in your mouth.  Your surgeon will make several small cuts (incisions) in your abdomen.  The laparoscope will be inserted through one of the small incisions. The camera on the laparoscope will send images to a TV screen (monitor) in the operating room. This lets your surgeon see inside your abdomen.  Air-like gas will be pumped into your abdomen. This will expand your abdomen to give the surgeon more room to perform the surgery.  Other tools that are needed for the procedure will be inserted through the other incisions. The gallbladder will be removed through one of   the incisions.  Your common bile duct may be examined. If stones are found in the common bile duct, they may be removed.  After your gallbladder has been removed, the incisions will be closed with stitches (sutures), staples, or skin glue.  Your incisions may be covered with a bandage (dressing). The procedure may vary among health care providers and hospitals. What happens after the procedure?  Your blood pressure, heart rate, breathing rate, and blood oxygen level will be monitored until the medicines you were given have worn off.  You will be given medicines as needed to control your pain.  Do not drive for 24 hours if you were given a sedative. This information is not intended to replace advice given to you by your health care provider. Make sure you  discuss any questions you have with your health care provider. Document Released: 07/10/2005 Document Revised: 01/30/2016 Document Reviewed: 12/27/2015 Elsevier Interactive Patient Education  2018 Elsevier Inc.  

## 2017-08-06 NOTE — Addendum Note (Signed)
Addended by: Reesa Chew E on: 08/06/2017 12:35 PM   Modules accepted: Orders

## 2017-08-06 NOTE — Telephone Encounter (Signed)
Urgent referral placed to [facility]. Called and scheduled patient an appointment on 08/08/2017 at 10:15 am with Dr.Perry for evaluation. Spoke with patient. Notified of appointment date and time. Patient is agreeable.  Routing to provider for final review. Patient agreeable to disposition. Will close encounter.

## 2017-08-08 ENCOUNTER — Ambulatory Visit (INDEPENDENT_AMBULATORY_CARE_PROVIDER_SITE_OTHER): Payer: 59 | Admitting: Internal Medicine

## 2017-08-08 ENCOUNTER — Encounter: Payer: Self-pay | Admitting: Internal Medicine

## 2017-08-08 VITALS — BP 102/62 | HR 89 | Ht 65.0 in | Wt 170.0 lb

## 2017-08-08 DIAGNOSIS — K8020 Calculus of gallbladder without cholecystitis without obstruction: Secondary | ICD-10-CM | POA: Diagnosis not present

## 2017-08-08 DIAGNOSIS — R109 Unspecified abdominal pain: Secondary | ICD-10-CM

## 2017-08-08 NOTE — Patient Instructions (Signed)
You will be contacted by [facility] to schedule an appointment

## 2017-08-08 NOTE — Progress Notes (Signed)
HISTORY OF PRESENT ILLNESS:  Carol Logan is a 42 y.o. 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 Talbert Nan regarding abdominal pain, abnormal CT scan with gallstones, and possible choledocholithiasis. Patient underwent hysterectomy in November. Just prior and thereafter she has been having problems with intermittent unpredictable postprandial abdominal pain which may last for an hour or longer. She thought this was part of her recovery process from her hysterectomy. Symptoms progressed this past weekend with an episode lasting up to 5 hours for which she was evaluated in the emergency room. Has bilateral evaluation she underwent contrast-enhanced CT scan of the abdomen and pelvis. She was found to have multiple large stones in the gallbladder measuring up to 2 cm and extending from the gallbladder fundus into the gallbladder neck. She was also felt to have mild gallbladder wall thickening and mild cholecystic stranding. Was also question of a 3 mm stone in the central common duct. However, no ductal dilation and her liver tests were completely normal while having pain. She was treated with symptomatic therapies, discharge, and told to follow up in this office. No surgical appointment arranged. Patient continues with intermittent pain related to meals. She is on no particular diet and has an Affinity toward fatty foods. She has had some nausea and vomiting. Some weight loss related to food avoidance pattern.. No fevers, dark urine, or jaundice.  REVIEW OF SYSTEMS:  All non-GI ROS negative unless otherwise stated in the history of present illness except for menstrual pain, muscle cramps, fatigue  Past Medical History:  Diagnosis Date  . Abnormal uterine bleeding   . Anemia   . Endometriosis   . Fibroid   . Fibroids   . Headache    with periods  . No pertinent past medical history   . Ovarian cyst   . SVD (spontaneous vaginal delivery)    x 2    Past Surgical  History:  Procedure Laterality Date  . ABDOMINAL HYSTERECTOMY    . CERVICAL CERCLAGE N/A 04/01/2013   Procedure: CERCLAGE CERVICAL;  Surgeon: [name], MD;  Location: [facility];  Service: Gynecology;  Laterality: N/A;  . CESAREAN SECTION N/A 09/18/2013   Procedure: CESAREAN SECTION;  Surgeon: [name], MD;  Location: [facility];  Service: Obstetrics;  Laterality: N/A;  . CYSTOSCOPY N/A 06/11/2017   Procedure: CYSTOSCOPY;  Surgeon: Nunzio Cobbs, MD;  Location: [facility];  Service: Gynecology;  Laterality: N/A;  . FOOT SURGERY Bilateral    buinonectomy  . FRACTURE SURGERY Right 1992   knee surgery  . LYSIS OF ADHESION N/A 06/11/2017   Procedure: LYSIS OF OMENTAL ADHESIONS;  Surgeon: Nunzio Cobbs, MD;  Location: [facility];  Service: Gynecology;  Laterality: N/A;  . MYOMECTOMY  10/11/2011   Procedure: MYOMECTOMY;  Surgeon: [name], MD;  Location: [facility];  Service: Gynecology;  Laterality: N/A;  . TOTAL LAPAROSCOPIC HYSTERECTOMY WITH SALPINGECTOMY Bilateral 06/11/2017   Procedure: TOTAL LAPAROSCOPIC HYSTERECTOMY WITH SALPINGECTOMY;  Surgeon: Nunzio Cobbs, MD;  Location: [facility];  Service: Gynecology;  Laterality: Bilateral;  . TUBAL LIGATION    . WISDOM TOOTH EXTRACTION      Social History TAMIKO LEOPARD  reports that  has never smoked. she has never used smokeless tobacco. She reports that she does not drink alcohol or use drugs.  family history includes Alzheimer's disease in her paternal uncle; Glaucoma in her paternal uncle; Heart disease in her maternal grandmother;  High blood pressure in her mother; Hypertension in her maternal grandmother, mother, paternal grandmother, and sister; Other in her brother and sister; Ovarian cancer in her other; Stroke in her paternal grandfather and paternal grandmother.  No Known Allergies     PHYSICAL EXAMINATION: Vital signs: BP 102/62   Pulse 89   Ht 5\' 5"  (1.651 m)   Wt 170 lb (77.1 kg)    LMP 05/30/2017   BMI 28.29 kg/m   Constitutional: generally well-appearing, no acute distress Psychiatric: alert and oriented x3, cooperative Eyes: extraocular movements intact, anicteric, conjunctiva pink Mouth: oral pharynx moist, no lesions Neck: supple no lymphadenopathy Cardiovascular: heart regular rate and rhythm, no murmur Lungs: clear to auscultation bilaterally Abdomen: soft, mild tenderness in the right upper quadrant palpation without rebound, nondistended, no obvious ascites, no peritoneal signs, normal bowel sounds, no organomegaly Rectal: Omitted Extremities: no clubbing, cyanosis, or lower extremity edema bilaterally Skin: no lesions on visible extremities Neuro: No focal deficits. Creel nurse intact  ASSESSMENT:  #1. Symptomatic cholelithiasis. Worsening with time. May have had mild cholecystitis with mild gallbladder wall thickening and mild pericholecystic fluid #2. Question tiny CBD stone on CT. This may actually be a stone in the cystic duct. If she does have a tiny CBD stone it is not clinically significant at present with absence of ductal dilation and normal liver tests in the presence of pain.   PLAN:  #1. Recommend urgent general surgical referral for surgical opinion on laparoscopic cholecystectomy with IOC for symptomatic cholelithiasis and possible diminutive choledocholithe. I see no need for preoperative MRCP in this case #2. If IOC positive then post cholecystectomy ERCP. I discussed with the patient the prospect's of ERCP preop if I felt that she had significant symptomatic choledocholithiasis. I don't. As such, ERCP and its associated risks may be avoided with negative IOC. I reviewed the ERCP procedure in detail as well as providing an educational brochure should this be necessary. Also recommended that she proceed to the hospital should she develop severe intractable abdominal pain prior to her surgical evaluation  A copy of this consultation note has  been sent to Dr. Talbert Nan

## 2017-08-10 ENCOUNTER — Telehealth: Payer: Self-pay | Admitting: Internal Medicine

## 2017-08-10 NOTE — Telephone Encounter (Signed)
Carol Logan looks like you would have made this [facility] referral at [place]. Pt calling wanting to know about that appt.

## 2017-08-10 NOTE — Telephone Encounter (Signed)
Spoke with patient and told her I had faxed her surgical referral to [facility] and she should hear from them the first of the week.  Patient agreed.

## 2017-08-10 NOTE — Telephone Encounter (Signed)
Pt states she is having pain again, she is waiting on an appt with [facility]. Per the OV note with Dr. Henrene Pastor if pt is having such bad pain she should return to the hospital. Pt verbalized understanding.

## 2017-08-14 DIAGNOSIS — R109 Unspecified abdominal pain: Secondary | ICD-10-CM | POA: Diagnosis present

## 2017-08-14 DIAGNOSIS — R1031 Right lower quadrant pain: Secondary | ICD-10-CM | POA: Diagnosis not present

## 2017-08-14 DIAGNOSIS — R1011 Right upper quadrant pain: Secondary | ICD-10-CM | POA: Diagnosis not present

## 2017-08-14 DIAGNOSIS — K8080 Other cholelithiasis without obstruction: Secondary | ICD-10-CM | POA: Insufficient documentation

## 2017-08-14 NOTE — ED Triage Notes (Addendum)
Patient reports last Saturday she was diagnosed with gallstones. She followed up with gastroenterologist on Wednesday. She is scheduled to see Dr. Johney Maine, [facility] tomorrow about removing gallbladder. Patient was informed to come to ED if pain got worse. Yesterday, she developed left flank pain. Patient has had nausea with vomiting. Denies any fever. Denies any urinary symptoms.

## 2017-08-15 ENCOUNTER — Ambulatory Visit: Payer: Self-pay | Admitting: Surgery

## 2017-08-15 ENCOUNTER — Encounter (HOSPITAL_COMMUNITY): Payer: Self-pay | Admitting: Family Medicine

## 2017-08-15 ENCOUNTER — Emergency Department (HOSPITAL_COMMUNITY)
Admission: EM | Admit: 2017-08-15 | Discharge: 2017-08-15 | Disposition: A | Discharge status: Home / Self Care | Payer: 59 | Attending: Emergency Medicine | Admitting: Emergency Medicine

## 2017-08-15 DIAGNOSIS — R1031 Right lower quadrant pain: Secondary | ICD-10-CM

## 2017-08-15 DIAGNOSIS — R1011 Right upper quadrant pain: Secondary | ICD-10-CM

## 2017-08-15 DIAGNOSIS — K8080 Other cholelithiasis without obstruction: Secondary | ICD-10-CM

## 2017-08-15 HISTORY — DX: Calculus of gallbladder without cholecystitis without obstruction: K80.20

## 2017-08-15 LAB — URINALYSIS, ROUTINE W REFLEX MICROSCOPIC
Bilirubin Urine: NEGATIVE
Glucose, UA: NEGATIVE mg/dL
Hgb urine dipstick: NEGATIVE
Ketones, ur: NEGATIVE mg/dL
Leukocytes, UA: NEGATIVE
Nitrite: NEGATIVE
Protein, ur: NEGATIVE mg/dL
Specific Gravity, Urine: 1.014 (ref 1.005–1.030)
pH: 6 (ref 5.0–8.0)

## 2017-08-15 LAB — COMPREHENSIVE METABOLIC PANEL
ALT: 11 U/L — ABNORMAL LOW (ref 14–54)
AST: 22 U/L (ref 15–41)
Albumin: 3.8 g/dL (ref 3.5–5.0)
Alkaline Phosphatase: 52 U/L (ref 38–126)
Anion gap: 5 (ref 5–15)
BUN: 14 mg/dL (ref 6–20)
CO2: 25 mmol/L (ref 22–32)
Calcium: 9.1 mg/dL (ref 8.9–10.3)
Chloride: 107 mmol/L (ref 101–111)
Creatinine, Ser: 0.77 mg/dL (ref 0.44–1.00)
GFR calc Af Amer: >60 mL/min (ref >60)
GFR calc non Af Amer: >60 mL/min (ref >60)
Glucose, Bld: 80 mg/dL (ref 65–99)
Potassium: 4.5 mmol/L (ref 3.5–5.1)
Sodium: 137 mmol/L (ref 135–145)
Total Bilirubin: 0.3 mg/dL (ref 0.3–1.2)
Total Protein: 7.5 g/dL (ref 6.5–8.1)

## 2017-08-15 LAB — CBC WITH DIFFERENTIAL/PLATELET
Basophils Absolute: 0 10*3/uL (ref 0.0–0.1)
Basophils Relative: 0 %
Eosinophils Absolute: 0.1 10*3/uL (ref 0.0–0.7)
Eosinophils Relative: 2 %
HCT: 35.3 % — ABNORMAL LOW (ref 36.0–46.0)
Hemoglobin: 11.6 g/dL — ABNORMAL LOW (ref 12.0–15.0)
Lymphocytes Relative: 46 %
Lymphs Abs: 2.7 10*3/uL (ref 0.7–4.0)
MCH: 27.5 pg (ref 26.0–34.0)
MCHC: 32.9 g/dL (ref 30.0–36.0)
MCV: 83.6 fL (ref 78.0–100.0)
Monocytes Absolute: 0.5 10*3/uL (ref 0.1–1.0)
Monocytes Relative: 8 %
Neutro Abs: 2.5 10*3/uL (ref 1.7–7.7)
Neutrophils Relative %: 44 %
Platelets: 191 10*3/uL (ref 150–400)
RBC: 4.22 MIL/uL (ref 3.87–5.11)
RDW: 15.6 % — ABNORMAL HIGH (ref 11.5–15.5)
WBC: 5.8 10*3/uL (ref 4.0–10.5)

## 2017-08-15 LAB — LIPASE, BLOOD: Lipase: 29 U/L (ref 11–51)

## 2017-08-15 LAB — POC URINE PREG, ED: Preg Test, Ur: NEGATIVE

## 2017-08-15 MED ORDER — MORPHINE SULFATE (PF) 4 MG/ML IV SOLN
4.0000 mg | Freq: Once | INTRAVENOUS | Status: AC
  Administered 2017-08-15: 4 mg via INTRAVENOUS
  Filled 2017-08-15: qty 1

## 2017-08-15 MED ORDER — MORPHINE SULFATE (PF) 4 MG/ML IV SOLN: 4.0000 mg | INTRAVENOUS | Status: DC | PRN

## 2017-08-15 NOTE — ED Provider Notes (Addendum)
[facility] DEPT Provider Note   CSN: [id number] Arrival date & time: 08/14/17  2300     History   Chief Complaint Chief Complaint  Patient presents with  . Cholelithiasis  . Flank Pain    HPI Carol Logan is a 42 y.o. female.  HPI 42 year old female comes in with chief complaint of abdominal pain. Patient has history of cholelithiasis, and she is to see the general surgeons later today.  Patient reports that she started having recurrent abdominal pain 1 day ago.  Abdominal pain is located in the left flank region, and also the right upper and right lower quadrant.  Patient's pain is similar to her gallbladder pain, except for the flank pain is new.  Patient denies any associated nausea, vomiting, fevers, chills.  Patient also denies any burning with urination, blood in the urine, frequent urination and she does not have any history of renal stones.  Patient also denies any vaginal discharge or bleeding and has no risk factors for STDs.   Past Medical History:  Diagnosis Date  . Abnormal uterine bleeding   . Anemia   . Endometriosis   . Fibroid   . Fibroids   . Gallstones   . Headache    with periods  . No pertinent past medical history   . Ovarian cyst   . SVD (spontaneous vaginal delivery)    x 2    Patient Active Problem List   Diagnosis Date Noted  . Status post laparoscopic hysterectomy 06/11/2017  . 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 Menorrhagia 09/11/2016  . Routine general medical examination at a health care facility 09/21/2015  . Hyperlipidemia 06/22/2014  . Obesity 06/17/2014  . Uterine leiomyoma 06/17/2014  . Allergic rhinitis 06/17/2014    Past Surgical History:  Procedure Laterality Date  . ABDOMINAL HYSTERECTOMY    . CERVICAL CERCLAGE N/A 04/01/2013   Procedure: CERCLAGE CERVICAL;  Surgeon: [name], MD;  Location: [facility];  Service: Gynecology;   Laterality: N/A;  . CESAREAN SECTION N/A 09/18/2013   Procedure: CESAREAN SECTION;  Surgeon: [name], MD;  Location: [facility];  Service: Obstetrics;  Laterality: N/A;  . CYSTOSCOPY N/A 06/11/2017   Procedure: CYSTOSCOPY;  Surgeon: Nunzio Cobbs, MD;  Location: [facility];  Service: Gynecology;  Laterality: N/A;  . FOOT SURGERY Bilateral    buinonectomy  . FRACTURE SURGERY Right 1992   knee surgery  . LYSIS OF ADHESION N/A 06/11/2017   Procedure: LYSIS OF OMENTAL ADHESIONS;  Surgeon: Nunzio Cobbs, MD;  Location: [facility];  Service: Gynecology;  Laterality: N/A;  . MYOMECTOMY  10/11/2011   Procedure: MYOMECTOMY;  Surgeon: [name], MD;  Location: [facility];  Service: Gynecology;  Laterality: N/A;  . TOTAL LAPAROSCOPIC HYSTERECTOMY WITH SALPINGECTOMY Bilateral 06/11/2017   Procedure: TOTAL LAPAROSCOPIC HYSTERECTOMY WITH SALPINGECTOMY;  Surgeon: Nunzio Cobbs, MD;  Location: [facility];  Service: Gynecology;  Laterality: Bilateral;  . TUBAL LIGATION    . WISDOM TOOTH EXTRACTION      OB History    Gravida Para Term Preterm AB Living   _0 0 2 2   SAB TAB Ectopic Multiple Live Births   0 2 0 0 2       Home Medications    Prior to Admission medications   Medication Sig Start Date End Date Taking? Authorizing Provider  ibuprofen (ADVIL,MOTRIN) 200  MG tablet Take 400 mg by mouth every 6 (six) hours as needed for moderate pain.   Yes [provider]  MELATONIN PO Take 1 tablet by mouth at bedtime as needed (sleep).   Yes [provider]  Multiple Vitamin (MULTIVITAMIN WITH MINERALS) TABS tablet Take 1 tablet daily by mouth.   Yes [provider]  ondansetron (ZOFRAN) 4 MG tablet Take 1 tablet (4 mg total) by mouth every 8 (eight) hours as needed for nausea or vomiting. 08/05/17  Yes Smith, [place], CNM  oxyCODONE-acetaminophen (PERCOCET/ROXICET) 5-325 MG tablet Take 1-2 tablets by mouth every 6 (six) hours as needed.  08/05/17  Yes Smith, [place], CNM  polyethylene glycol powder (GLYCOLAX/MIRALAX) powder Take 17 g by mouth daily as needed for moderate constipation. 08/05/17  Yes Tamala Julian, [place], CNM    Family History Family History  Problem Relation Age of Onset  . High blood pressure Mother   . Hypertension Mother   . Glaucoma Paternal Uncle   . Alzheimer's disease Paternal Uncle   . Hypertension Sister   . Other Sister        GYN complications  . Other Brother        Homicide  . Hypertension Maternal Grandmother   . Heart disease Maternal Grandmother   . Hypertension Paternal Grandmother   . Stroke Paternal Grandmother   . Stroke Paternal Grandfather   . Ovarian cancer Other   . Depression Neg Hx   . Diabetes Neg Hx   . Drug abuse Neg Hx   . Early death Neg Hx   . Alcohol abuse Neg Hx   . Hyperlipidemia Neg Hx   . Kidney disease Neg Hx   . Colon cancer Neg Hx   . Stomach cancer Neg Hx     Social History Social History   Tobacco Use  . Smoking status: Never Smoker  . Smokeless tobacco: Never Used  Substance Use Topics  . Alcohol use: No    Comment: socially- 1 glass of wine monthly  . Drug use: No     Allergies   Patient has no known allergies.   Review of Systems Review of Systems  Gastrointestinal: Positive for abdominal pain.  Genitourinary: Positive for flank pain. Negative for dysuria.  All other systems reviewed and are negative.    Physical Exam Updated Vital Signs BP 128/84 (BP Location: Left Arm)   Pulse 68   Temp 98.2 F (36.8 C) (Oral)   Resp 16   Ht '5\' 5"'$  (1.651 m)   Wt 74.8 kg (165 lb)   LMP 05/30/2017   SpO2 98%   BMI 27.46 kg/m   Physical Exam  Constitutional: She is oriented to person, place, and time. She appears well-developed.  HENT:  Head: Normocephalic and atraumatic.  Eyes: EOM are normal.  Neck: Normal range of motion. Neck supple.  Cardiovascular: Normal rate.  Pulmonary/Chest: Effort normal.  Abdominal: Bowel sounds are  normal. There is tenderness.  Patient has tenderness over the left flank region, right upper quadrant and right lower quadrant.  Patient has no guarding, specifically she does not have Murphy's or McBurney's.  Neurological: She is alert and oriented to person, place, and time.  Skin: Skin is warm and dry.  Nursing note and vitals reviewed.    ED Treatments / Results  Labs (all labs ordered are listed, but only abnormal results are displayed) Labs Reviewed  CBC WITH DIFFERENTIAL/PLATELET - Abnormal; Notable for the following components:      Result Value  Hemoglobin 11.6 (*)    HCT 35.3 (*)    RDW 15.6 (*)    All other components within normal limits  COMPREHENSIVE METABOLIC PANEL - Abnormal; Notable for the following components:   ALT 11 (*)    All other components within normal limits  URINALYSIS, ROUTINE W REFLEX MICROSCOPIC  LIPASE, BLOOD  POC URINE PREG, ED  I-STAT BETA HCG BLOOD, ED ([facility], [facility], [facility] ONLY)  GC/CHLAMYDIA PROBE AMP () NOT AT [facility]    EKG  EKG Interpretation None       Radiology No results found.  Procedures Procedures (including critical care time)  Medications Ordered in ED Medications  morphine 4 MG/ML injection 4 mg (not administered)  morphine 4 MG/ML injection 4 mg (4 mg Intravenous Given 08/15/17 0211)     Initial Impression / Assessment and Plan / ED Course  I have reviewed the triage vital signs and the nursing notes.  Pertinent labs & imaging results that were available during my care of the patient were reviewed by me and considered in my medical decision making (see chart for details).  Clinical Course as of Aug 15 720  Wed Aug 15, 2017  0718 Lab results are completely reassuring.  LFTs, alk phos, CBC all unremarkable.  UA does not show any hematuria or signs of infection.  Pregnancy test is negative.  Meanwhile, patient received 1 round of morphine and her pain improved dramatically.  Repeat exam is unchanged however, and  there is still tenderness to palpation on the right side of the abdomen.  Patient is to see general surgery later today, therefore we will continue with the expected plan of surgical f.u, WBC: 5.8 [AN]    Clinical Course User Index [AN] Varney Biles, MD    42 year old female comes in with chief complaint of left flank pain and right-sided abdominal pain.  Patient has known history of cholelithiasis, she is to see general surgery today.  CT scan from her prior visit was reviewed by me.  On exam, patient is noted to have right lower quadrant tenderness that is worse than right upper quadrant tenderness.  Patient does not have any peritoneal findings.  Patient is also having flank pain without any UTI-like symptoms and there is no history of renal stones.  Patient had declined pelvic exam, as she had just seen at [facility] last week.  Plan is to get basic labs, and then reassess for potential imaging.  Final Clinical Impressions(s) / ED Diagnoses   Final diagnoses:  RUQ pain  RLQ abdominal pain  Biliary calculus of other site without obstruction    ED Discharge Orders    None       Varney Biles, MD 08/15/17 3567    Varney Biles, MD 08/15/17 [phone]

## 2017-08-15 NOTE — ED Notes (Signed)
Pelvic setup at bedside, EDP notified.

## 2017-08-15 NOTE — H&P (View-Only) (Signed)
Carol Logan Documented: 08/15/2017 9:34 AM Location: [facility] Patient #: [id number] DOB: 10/19/1975 Single / Language: Carol Logan / Race: Black or African American Female  History of Present Illness Carol Hector MD; 08/15/2017 10:31 AM) The patient is a 42 year old female who presents for evaluation of gall stones. Note for "Gall stones": ` ` ` Patient sent for surgical consultation at the request of Dr. Scarlette Shorts  Chief Complaint: Abdominal pain and gallstones.  The patient is a female that had abdominal pain. Had hysterectomy for fibroids in November. Had some abdominal pain 6 weeks later and went to the [facility] emergency room. Workup concerning for perhaps gallstones, bile duct stone. Our group was called. Sounded like the common bile duct issue needed to be addressed with gastroenterology consultation. She was discharged to follow-up with gastroenterology. Dr. Henrene Pastor saw the patient 4 days later. He was skeptical that she had significant choledocholithiasis as her liver function tests were normal, she had no ductal dilatation, and she was asymptomatic in the office.. Surgical consultation requested. She comes in the following week.  Apparently she had abdominal pain to the left flank and lower abdomen and went to the emergency room last night. She was in there for 12 hours. Pain improved after morphine. No leukocytosis. No increase in liver function tests. The held off on any more aggressive radiographic evaluation. She left the hospital was 3 hours ago. She's been up all night and tired. She denies any heartburn or reflux. She notes if she eats it triggers off discomfort in wakes her up at night. She's had intermittent attacks for the past few months. Usually less than hour. The more severe attack last for 5 hours. That made her go to [facility]. Then another attack last night. She normally moves her bowels every day. She's tended to be a  little more constipated but that is improving. He can walk a few miles without difficulty. No heart or lung issues. 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 Stating rather busy. Denies much later heartburn or reflux. No help with Tums or Rolaids. Intermittent crampy abdominal pain. No history of urinary tract infections urine stones. No dysuria. No dark urine. No jaundice. No light-colored stools.  No personal nor family history of GI/colon cancer, inflammatory bowel disease, irritable bowel syndrome, allergy such as Celiac Sprue, dietary/dairy problems, colitis, ulcers nor gastritis. No recent sick contacts/gastroenteritis. No travel outside the country. No changes in diet. No dysphagia to solids or liquids. No significant heartburn or reflux. No hematochezia, hematemesis, coffee ground emesis. No evidence of prior gastric/peptic ulceration.  (Review of systems as stated in this history (HPI) or in the review of systems. Otherwise all other 12 point ROS are negative)   Past Surgical History Carol Hector, MD; 08/15/2017 10:25 AM) Cesarean Section - 1 Foot Surgery Bilateral. TAH-BSO (TOTAL ABDOMINAL HYSTERECTOMY AND BILATERAL SALPINGO-OOPHORECTOMY) (96759) [06/11/2017]: POSTOPERATIVE DIAGNOSIS: Fibroids, dysmenorrhea, anemia. PROCEDURES: Total laparoscopic hysterectomy with bilateral salpingectomy, lysis of omental adhesions, cystoscopy SURGEON: [name], M.D.  Knee Surgery Left. Oral Surgery  Diagnostic Studies History Illene Logan, CMA; 08/15/2017 10:22 AM) Colonoscopy never Mammogram within last year Pap Smear 1-5 years ago  Allergies Carol Logan, RMA; 08/15/2017 9:36 AM) No Known Drug Allergies [08/15/2017]: Allergies Reconciled  Medication History Carol Logan, [place]; 08/15/2017 9:36 AM) Medications Reconciled Percocet (2.5-325MG  Tablet, Oral) Active.  Social History Illene Logan, CMA; 08/15/2017 10:22 AM) Alcohol use  Occasional alcohol use. Caffeine use Coffee, Tea. No drug use  Tobacco use Never smoker.  Family History Illene Logan, [place]; 08/15/2017 10:22 AM) Arthritis Family Members In General, Mother. Bleeding disorder Mother, Sister. Cerebrovascular Accident Family Members In General. Heart Disease Family Members In General.  Pregnancy / Birth History Illene Logan, [place]; 08/15/2017 10:22 AM) Age at menarche 62 years. Contraceptive History Oral contraceptives. Gravida 4 Irregular periods Maternal age 44-25 Para 2  Other Problems Illene Logan, CMA; 08/15/2017 10:22 AM) Cholelithiasis Other disease, cancer, significant illness     Review of Systems [name] CMA; 08/15/2017 10:22 AM) General Present- Chills, Fatigue and Weight Loss. Not Present- Appetite Loss, Fever, Night Sweats and Weight Gain. Skin Present- Dryness. Not Present- Change in Wart/Mole, Hives, Jaundice, New Lesions, Non-Healing Wounds, Rash and Ulcer. Gastrointestinal Present- Abdominal Pain, Bloating, Change in Bowel Habits, Gets full quickly at meals, Nausea and Vomiting. Not Present- Bloody Stool, Chronic diarrhea, Constipation, Difficulty Swallowing, Excessive gas, Hemorrhoids, Indigestion and Rectal Pain. Musculoskeletal Present- Back Pain and Muscle Weakness. Not Present- Joint Pain, Joint Stiffness, Muscle Pain and Swelling of Extremities. Neurological Present- Headaches and Weakness. Not Present- Decreased Memory, Fainting, Numbness, Seizures, Tingling, Tremor and Trouble walking.  Vitals Carol Logan RMA; 08/15/2017 9:35 AM) 08/15/2017 9:35 AM Weight: 170.2 lb Height: 65in Body Surface Area: 1.85 m Body Mass Index: 28.32 kg/m  Temp.: 98.59F  Pulse: 97 (Regular)  BP: 120/78 (Sitting, Left Arm, Standard)      Physical Exam Carol Hector MD; 08/15/2017 10:29 AM)  General Mental Status-Alert. General Appearance-Not in acute distress, Not  Sickly. Orientation-Oriented X3. Hydration-Well hydrated. Voice-Normal. Note: Tired but nontoxic.  Integumentary Global Assessment Upon inspection and palpation of skin surfaces of the - Axillae: non-tender, no inflammation or ulceration, no drainage. and Distribution of scalp and body hair is normal. General Characteristics Temperature - normal warmth is noted.  Head and Neck Head-normocephalic, atraumatic with no lesions or palpable masses. Face Global Assessment - atraumatic, no absence of expression. Neck Global Assessment - no abnormal movements, no bruit auscultated on the right, no bruit auscultated on the left, no decreased range of motion, non-tender. Trachea-midline. Thyroid Gland Characteristics - non-tender.  Eye Eyeball - Left-Extraocular movements intact, No Nystagmus. Eyeball - Right-Extraocular movements intact, No Nystagmus. Cornea - Left-No Hazy. Cornea - Right-No Hazy. Sclera/Conjunctiva - Left-No scleral icterus, No Discharge. Sclera/Conjunctiva - Right-No scleral icterus, No Discharge. Pupil - Left-Direct reaction to light normal. Pupil - Right-Direct reaction to light normal.  ENMT Ears Pinna - Left - no drainage observed, no generalized tenderness observed. Right - no drainage observed, no generalized tenderness observed. Nose and Sinuses External Inspection of the Nose - no destructive lesion observed. Inspection of the nares - Left - quiet respiration. Right - quiet respiration. Mouth and Throat Lips - Upper Lip - no fissures observed, no pallor noted. Lower Lip - no fissures observed, no pallor noted. Nasopharynx - no discharge present. Oral Cavity/Oropharynx - Tongue - no dryness observed. Oral Mucosa - no cyanosis observed. Hypopharynx - no evidence of airway distress observed.  Chest and Lung Exam Inspection Movements - Normal and Symmetrical. Accessory muscles - No use of accessory muscles in  breathing. Palpation Palpation of the chest reveals - Non-tender. Auscultation Breath sounds - Normal and Clear. Note: Some soreness along left lateral chest wall especially on ribs.  Cardiovascular Auscultation Rhythm - Regular. Murmurs & Other Heart Sounds - Auscultation of the heart reveals - No Murmurs and No Systolic Clicks.  Abdomen Inspection Inspection of the abdomen reveals - No Visible peristalsis and No Abnormal pulsations.  Umbilicus - No Bleeding, No Urine drainage. Palpation/Percussion Palpation and Percussion of the abdomen reveal - Soft, Non Tender, No Rebound tenderness, No Rigidity (guarding) and No Cutaneous hyperesthesia. Note: Mild epigastric discomfort more than right upper quadrant. No Murphy sign. Mild discomfort along Pfannenstiel incision especially left corner but not severe. Abdomen soft. Not severely distended. No distasis recti. No umbilical or other anterior abdominal wall hernias  Female Genitourinary Sexual Maturity Tanner 5 - Adult hair pattern. Note: No vaginal bleeding nor discharge  Peripheral Vascular Upper Extremity Inspection - Left - No Cyanotic nailbeds, Not Ischemic. Right - No Cyanotic nailbeds, Not Ischemic.  Neurologic Neurologic evaluation reveals -normal attention span and ability to concentrate, able to name objects and repeat phrases. Appropriate fund of knowledge , normal sensation and normal coordination. Mental Status Affect - not angry, not paranoid. Cranial Nerves-Normal Bilaterally. Gait-Normal.  Neuropsychiatric Mental status exam performed with findings of-able to articulate well with normal speech/language, rate, volume and coherence, thought content normal with ability to perform basic computations and apply abstract reasoning and no evidence of hallucinations, delusions, obsessions or homicidal/suicidal ideation.  Musculoskeletal Global Assessment Spine, Ribs and Pelvis - no instability, subluxation or  laxity. Right Upper Extremity - no instability, subluxation or laxity. Note: Soreness that left shoulder with somewhat decreased range of motion. No obvious crepitus. No obvious weakness or wound  Lymphatic Head & Neck  General Head & Neck Lymphatics: Bilateral - Description - No Localized lymphadenopathy. Axillary  General Axillary Region: Bilateral - Description - No Localized lymphadenopathy. Femoral & Inguinal  Generalized Femoral & Inguinal Lymphatics: Left - Description - No Localized lymphadenopathy. Right - Description - No Localized lymphadenopathy.    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 I think she would benefit from cholecystectomy. Cholangiogram given the question of a common bile duct stone on CT scan. I agree with Dr. I am skeptical that she has significant alone any choledocholithiasis and she does not have any increased liver function tests and she has no jaundice. If she had a small stone most likely passed. The stones remaining her gallbladder actually quite large.  I am guarded that this will solve all of her complaints including her left shoulder and left chest wall pain. But there is no cardiopulmonary issue. She is quite miserable.  I'll try and see if I can fit her in urgently this week. The is taking longer to that, may need ask one of my partners in the hospital to see if they can do it sooner. She'll focus on liquids only. She ready has pain and nausea meds for emergency room visit.  Current Plans You are being scheduled for surgery- Our schedulers will call you.  You should hear from our office's scheduling department within 5 working days about the location, date, and time of surgery. We try to make accommodations for patient's preferences in scheduling surgery, but sometimes the OR schedule or the surgeon's  schedule prevents [place] from making those accommodations.  If you have not heard from our office [phone]) in 5 working days, call the office and ask for your surgeon's nurse.  If you have other questions about your diagnosis, plan, or surgery, call the office and ask for your surgeon's nurse.  Written instructions provided Pt Education - Pamphlet Given - Laparoscopic Gallbladder Surgery: discussed with patient and provided information. The anatomy & physiology of hepatobiliary & pancreatic function was  discussed. The pathophysiology of gallbladder dysfunction was discussed. Natural history risks without surgery was discussed. I feel the risks of no intervention will lead to serious problems that outweigh the operative risks; therefore, I recommended cholecystectomy to remove the pathology. I explained laparoscopic techniques with possible need for an open approach. Probable cholangiogram to evaluate the bilary tract was explained as well.  Risks such as bleeding, infection, abscess, leak, injury to other organs, need for further treatment, heart attack, death, and other risks were discussed. I noted a good likelihood this will help address the problem. Possibility that this will not correct all abdominal symptoms was explained. Goals of post-operative recovery were discussed as well. We will work to minimize complications. An educational handout further explaining the pathology and treatment options was given as well. Questions were answered. The patient expresses understanding & wishes to proceed with surgery.  Pt Education - [facility] Laparosopic Post Op HCI ([facility]) Pt Education - [facility] Good Bowel Health (Shateria Paternostro) Pt Education - Laparoscopic Cholecystectomy: gallbladder  Carol Logan, M.D., F.A.C.S. Gastrointestinal and Minimally Invasive Surgery [facility] [address] [place], [place] [address] [phone] Main / Paging

## 2017-08-15 NOTE — Discharge Instructions (Signed)
See the general surgeons as planned. Avoid fatty foods, fried foods, cheese or dairy products.

## 2017-08-15 NOTE — ED Notes (Signed)
Spoke with EDP Nanavati about pelvic, pelvic set up at bedside, EDP to bedside shortly.

## 2017-08-15 NOTE — H&P (Signed)
Carol Logan Documented: 08/15/2017 9:34 AM Location: [facility] Patient #: [id number] DOB: Feb 03, 1976 Single / Language: Carol Logan / Race: Black or African American Female  History of Present Illness [name] MD; 08/15/2017 10:31 AM) The patient is a 42 year old female who presents for evaluation of gall stones. Note for "Gall stones": ` ` ` Patient sent for surgical consultation at the request of Dr. Scarlette Shorts  Chief Complaint: Abdominal pain and gallstones.  The patient is a female that had abdominal pain. Had hysterectomy for fibroids in November. Had some abdominal pain 6 weeks later and went to the [facility] emergency room. Workup concerning for perhaps gallstones, bile duct stone. Our group was called. Sounded like the common bile duct issue needed to be addressed with gastroenterology consultation. She was discharged to follow-up with gastroenterology. Dr. Henrene Pastor saw the patient 4 days later. He was skeptical that she had significant choledocholithiasis as her liver function tests were normal, she had no ductal dilatation, and she was asymptomatic in the office.. Surgical consultation requested. She comes in the following week.  Apparently she had abdominal pain to the left flank and lower abdomen and went to the emergency room last night. She was in there for 12 hours. Pain improved after morphine. No leukocytosis. No increase in liver function tests. The held off on any more aggressive radiographic evaluation. She left the hospital was 3 hours ago. She's been up all night and tired. She denies any heartburn or reflux. She notes if she eats it triggers off discomfort in wakes her up at night. She's had intermittent attacks for the past few months. Usually less than hour. The more severe attack last for 5 hours. That made her go to [facility]. Then another attack last night. She normally moves her bowels every day. She's tended to be a  little more constipated but that is improving. He can walk a few miles without difficulty. No heart or lung issues. 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 Stating rather busy. Denies much later heartburn or reflux. No help with Tums or Rolaids. Intermittent crampy abdominal pain. No history of urinary tract infections urine stones. No dysuria. No dark urine. No jaundice. No light-colored stools.  No personal nor family history of GI/colon cancer, inflammatory bowel disease, irritable bowel syndrome, allergy such as Celiac Sprue, dietary/dairy problems, colitis, ulcers nor gastritis. No recent sick contacts/gastroenteritis. No travel outside the country. No changes in diet. No dysphagia to solids or liquids. No significant heartburn or reflux. No hematochezia, hematemesis, coffee ground emesis. No evidence of prior gastric/peptic ulceration.  (Review of systems as stated in this history (HPI) or in the review of systems. Otherwise all other 12 point ROS are negative)   Past Surgical History [name], MD; 08/15/2017 10:25 AM) Cesarean Section - 1 Foot Surgery Bilateral. TAH-BSO (TOTAL ABDOMINAL HYSTERECTOMY AND BILATERAL SALPINGO-OOPHORECTOMY) (27741) [06/11/2017]: POSTOPERATIVE DIAGNOSIS: Fibroids, dysmenorrhea, anemia. PROCEDURES: Total laparoscopic hysterectomy with bilateral salpingectomy, lysis of omental adhesions, cystoscopy SURGEON: [name], M.D.  Knee Surgery Left. Oral Surgery  Diagnostic Studies History Illene Regulus, CMA; 08/15/2017 10:22 AM) Colonoscopy never Mammogram within last year Pap Smear 1-5 years ago  Allergies [name], RMA; 08/15/2017 9:36 AM) No Known Drug Allergies [08/15/2017]: Allergies Reconciled  Medication History [name], [place]; 08/15/2017 9:36 AM) Medications Reconciled Percocet (2.5-325MG  Tablet, Oral) Active.  Social History Illene Regulus, CMA; 08/15/2017 10:22 AM) Alcohol use  Occasional alcohol use. Caffeine use Coffee, Tea. No drug use  Tobacco use Never smoker.  Family History Illene Regulus, [place]; 08/15/2017 10:22 AM) Arthritis Family Members In General, Mother. Bleeding disorder Mother, Sister. Cerebrovascular Accident Family Members In General. Heart Disease Family Members In General.  Pregnancy / Birth History Illene Regulus, [place]; 08/15/2017 10:22 AM) Age at menarche 56 years. Contraceptive History Oral contraceptives. Gravida 4 Irregular periods Maternal age 14-25 Para 2  Other Problems Illene Regulus, CMA; 08/15/2017 10:22 AM) Cholelithiasis Other disease, cancer, significant illness     Review of Systems [name] CMA; 08/15/2017 10:22 AM) General Present- Chills, Fatigue and Weight Loss. Not Present- Appetite Loss, Fever, Night Sweats and Weight Gain. Skin Present- Dryness. Not Present- Change in Wart/Mole, Hives, Jaundice, New Lesions, Non-Healing Wounds, Rash and Ulcer. Gastrointestinal Present- Abdominal Pain, Bloating, Change in Bowel Habits, Gets full quickly at meals, Nausea and Vomiting. Not Present- Bloody Stool, Chronic diarrhea, Constipation, Difficulty Swallowing, Excessive gas, Hemorrhoids, Indigestion and Rectal Pain. Musculoskeletal Present- Back Pain and Muscle Weakness. Not Present- Joint Pain, Joint Stiffness, Muscle Pain and Swelling of Extremities. Neurological Present- Headaches and Weakness. Not Present- Decreased Memory, Fainting, Numbness, Seizures, Tingling, Tremor and Trouble walking.  Vitals [name] RMA; 08/15/2017 9:35 AM) 08/15/2017 9:35 AM Weight: 170.2 lb Height: 65in Body Surface Area: 1.85 m Body Mass Index: 28.32 kg/m  Temp.: 98.35F  Pulse: 97 (Regular)  BP: 120/78 (Sitting, Left Arm, Standard)      Physical Exam [name] MD; 08/15/2017 10:29 AM)  General Mental Status-Alert. General Appearance-Not in acute distress, Not  Sickly. Orientation-Oriented X3. Hydration-Well hydrated. Voice-Normal. Note: Tired but nontoxic.  Integumentary Global Assessment Upon inspection and palpation of skin surfaces of the - Axillae: non-tender, no inflammation or ulceration, no drainage. and Distribution of scalp and body hair is normal. General Characteristics Temperature - normal warmth is noted.  Head and Neck Head-normocephalic, atraumatic with no lesions or palpable masses. Face Global Assessment - atraumatic, no absence of expression. Neck Global Assessment - no abnormal movements, no bruit auscultated on the right, no bruit auscultated on the left, no decreased range of motion, non-tender. Trachea-midline. Thyroid Gland Characteristics - non-tender.  Eye Eyeball - Left-Extraocular movements intact, No Nystagmus. Eyeball - Right-Extraocular movements intact, No Nystagmus. Cornea - Left-No Hazy. Cornea - Right-No Hazy. Sclera/Conjunctiva - Left-No scleral icterus, No Discharge. Sclera/Conjunctiva - Right-No scleral icterus, No Discharge. Pupil - Left-Direct reaction to light normal. Pupil - Right-Direct reaction to light normal.  ENMT Ears Pinna - Left - no drainage observed, no generalized tenderness observed. Right - no drainage observed, no generalized tenderness observed. Nose and Sinuses External Inspection of the Nose - no destructive lesion observed. Inspection of the nares - Left - quiet respiration. Right - quiet respiration. Mouth and Throat Lips - Upper Lip - no fissures observed, no pallor noted. Lower Lip - no fissures observed, no pallor noted. Nasopharynx - no discharge present. Oral Cavity/Oropharynx - Tongue - no dryness observed. Oral Mucosa - no cyanosis observed. Hypopharynx - no evidence of airway distress observed.  Chest and Lung Exam Inspection Movements - Normal and Symmetrical. Accessory muscles - No use of accessory muscles in  breathing. Palpation Palpation of the chest reveals - Non-tender. Auscultation Breath sounds - Normal and Clear. Note: Some soreness along left lateral chest wall especially on ribs.  Cardiovascular Auscultation Rhythm - Regular. Murmurs & Other Heart Sounds - Auscultation of the heart reveals - No Murmurs and No Systolic Clicks.  Abdomen Inspection Inspection of the abdomen reveals - No Visible peristalsis and No Abnormal pulsations.  Umbilicus - No Bleeding, No Urine drainage. Palpation/Percussion Palpation and Percussion of the abdomen reveal - Soft, Non Tender, No Rebound tenderness, No Rigidity (guarding) and No Cutaneous hyperesthesia. Note: Mild epigastric discomfort more than right upper quadrant. No Murphy sign. Mild discomfort along Pfannenstiel incision especially left corner but not severe. Abdomen soft. Not severely distended. No distasis recti. No umbilical or other anterior abdominal wall hernias  Female Genitourinary Sexual Maturity Tanner 5 - Adult hair pattern. Note: No vaginal bleeding nor discharge  Peripheral Vascular Upper Extremity Inspection - Left - No Cyanotic nailbeds, Not Ischemic. Right - No Cyanotic nailbeds, Not Ischemic.  Neurologic Neurologic evaluation reveals -normal attention span and ability to concentrate, able to name objects and repeat phrases. Appropriate fund of knowledge , normal sensation and normal coordination. Mental Status Affect - not angry, not paranoid. Cranial Nerves-Normal Bilaterally. Gait-Normal.  Neuropsychiatric Mental status exam performed with findings of-able to articulate well with normal speech/language, rate, volume and coherence, thought content normal with ability to perform basic computations and apply abstract reasoning and no evidence of hallucinations, delusions, obsessions or homicidal/suicidal ideation.  Musculoskeletal Global Assessment Spine, Ribs and Pelvis - no instability, subluxation or  laxity. Right Upper Extremity - no instability, subluxation or laxity. Note: Soreness that left shoulder with somewhat decreased range of motion. No obvious crepitus. No obvious weakness or wound  Lymphatic Head & Neck  General Head & Neck Lymphatics: Bilateral - Description - No Localized lymphadenopathy. Axillary  General Axillary Region: Bilateral - Description - No Localized lymphadenopathy. Femoral & Inguinal  Generalized Femoral & Inguinal Lymphatics: Left - Description - No Localized lymphadenopathy. Right - Description - No Localized lymphadenopathy.    Assessment & Plan [name] MD; 08/15/2017 10:27 AM)  CHRONIC CHOLECYSTITIS WITH CALCULUS (K80.10) Impression: Episodes of biliary colic with increasing frequency and intensity. I think she would benefit from cholecystectomy. Cholangiogram given the question of a common bile duct stone on CT scan. I agree with Dr. I am skeptical that she has significant alone any choledocholithiasis and she does not have any increased liver function tests and she has no jaundice. If she had a small stone most likely passed. The stones remaining her gallbladder actually quite large.  I am guarded that this will solve all of her complaints including her left shoulder and left chest wall pain. But there is no cardiopulmonary issue. She is quite miserable.  I'll try and see if I can fit her in urgently this week. The is taking longer to that, may need ask one of my partners in the hospital to see if they can do it sooner. She'll focus on liquids only. She ready has pain and nausea meds for emergency room visit.  Current Plans You are being scheduled for surgery- Our schedulers will call you.  You should hear from our office's scheduling department within 5 working days about the location, date, and time of surgery. We try to make accommodations for patient's preferences in scheduling surgery, but sometimes the OR schedule or the surgeon's  schedule prevents [place] from making those accommodations.  If you have not heard from our office [phone]) in 5 working days, call the office and ask for your surgeon's nurse.  If you have other questions about your diagnosis, plan, or surgery, call the office and ask for your surgeon's nurse.  Written instructions provided Pt Education - Pamphlet Given - Laparoscopic Gallbladder Surgery: discussed with patient and provided information. The anatomy & physiology of hepatobiliary & pancreatic function was  discussed. The pathophysiology of gallbladder dysfunction was discussed. Natural history risks without surgery was discussed. I feel the risks of no intervention will lead to serious problems that outweigh the operative risks; therefore, I recommended cholecystectomy to remove the pathology. I explained laparoscopic techniques with possible need for an open approach. Probable cholangiogram to evaluate the bilary tract was explained as well.  Risks such as bleeding, infection, abscess, leak, injury to other organs, need for further treatment, heart attack, death, and other risks were discussed. I noted a good likelihood this will help address the problem. Possibility that this will not correct all abdominal symptoms was explained. Goals of post-operative recovery were discussed as well. We will work to minimize complications. An educational handout further explaining the pathology and treatment options was given as well. Questions were answered. The patient expresses understanding & wishes to proceed with surgery.  Pt Education - [facility] Laparosopic Post Op HCI (Chun Sellen) Pt Education - [facility] Good Bowel Health (Aine Strycharz) Pt Education - Laparoscopic Cholecystectomy: gallbladder  [name], M.D., F.A.C.S. Gastrointestinal and Minimally Invasive Surgery [facility] [address] [place], [place] [address] [phone] Main / Paging

## 2017-08-16 ENCOUNTER — Encounter (HOSPITAL_COMMUNITY): Payer: Self-pay | Admitting: *Deleted

## 2017-08-16 ENCOUNTER — Other Ambulatory Visit: Payer: Self-pay

## 2017-08-16 MED ORDER — BUPIVACAINE LIPOSOME 1.3 % IJ SUSP
20.0000 mL | INTRAMUSCULAR | Status: AC
  Administered 2017-08-17: 20 mL
  Filled 2017-08-16: qty 20

## 2017-08-17 ENCOUNTER — Ambulatory Visit (HOSPITAL_COMMUNITY)
Admission: RE | Admit: 2017-08-17 | Discharge: 2017-08-17 | Disposition: A | Discharge status: Home / Self Care | Payer: 59 | Source: Ambulatory Visit | Attending: Surgery | Admitting: Surgery

## 2017-08-17 ENCOUNTER — Ambulatory Visit (HOSPITAL_COMMUNITY): Payer: 59 | Admitting: Certified Registered Nurse Anesthetist

## 2017-08-17 ENCOUNTER — Other Ambulatory Visit: Payer: Self-pay

## 2017-08-17 ENCOUNTER — Encounter (HOSPITAL_COMMUNITY): Payer: Self-pay | Admitting: Certified Registered Nurse Anesthetist

## 2017-08-17 ENCOUNTER — Encounter (HOSPITAL_COMMUNITY)
Admission: RE | Disposition: A | Discharge status: Home / Self Care | Payer: Self-pay | Source: Ambulatory Visit | Attending: Surgery

## 2017-08-17 ENCOUNTER — Ambulatory Visit (HOSPITAL_COMMUNITY): Payer: 59

## 2017-08-17 DIAGNOSIS — K8010 Calculus of gallbladder with chronic cholecystitis without obstruction: Secondary | ICD-10-CM | POA: Insufficient documentation

## 2017-08-17 DIAGNOSIS — Z90710 Acquired absence of both cervix and uterus: Secondary | ICD-10-CM | POA: Diagnosis not present

## 2017-08-17 DIAGNOSIS — K7460 Unspecified cirrhosis of liver: Secondary | ICD-10-CM | POA: Insufficient documentation

## 2017-08-17 DIAGNOSIS — M25512 Pain in left shoulder: Secondary | ICD-10-CM | POA: Diagnosis not present

## 2017-08-17 DIAGNOSIS — K8062 Calculus of gallbladder and bile duct with acute cholecystitis without obstruction: Secondary | ICD-10-CM

## 2017-08-17 DIAGNOSIS — R0789 Other chest pain: Secondary | ICD-10-CM | POA: Diagnosis not present

## 2017-08-17 DIAGNOSIS — Z419 Encounter for procedure for purposes other than remedying health state, unspecified: Secondary | ICD-10-CM

## 2017-08-17 HISTORY — PX: LAPAROSCOPIC CHOLECYSTECTOMY SINGLE SITE WITH INTRAOPERATIVE CHOLANGIOGRAM: SHX6538

## 2017-08-17 SURGERY — LAPAROSCOPIC CHOLECYSTECTOMY SINGLE SITE WITH INTRAOPERATIVE CHOLANGIOGRAM
Anesthesia: General | Site: Abdomen

## 2017-08-17 MED ORDER — LIP MEDEX EX OINT: 1.0000 "application " | TOPICAL_OINTMENT | Freq: Two times a day (BID) | CUTANEOUS | Status: DC

## 2017-08-17 MED ORDER — LACTATED RINGERS IV BOLUS (SEPSIS): 1000.0000 mL | Freq: Once | INTRAVENOUS | Status: DC

## 2017-08-17 MED ORDER — SODIUM CHLORIDE 0.9% FLUSH: 3.0000 mL | Freq: Two times a day (BID) | INTRAVENOUS | Status: DC

## 2017-08-17 MED ORDER — CELECOXIB 200 MG PO CAPS
200.0000 mg | ORAL_CAPSULE | ORAL | Status: AC
  Administered 2017-08-17: 200 mg via ORAL

## 2017-08-17 MED ORDER — PROCHLORPERAZINE EDISYLATE 5 MG/ML IJ SOLN: 5.0000 mg | INTRAMUSCULAR | Status: DC | PRN

## 2017-08-17 MED ORDER — DEXAMETHASONE SODIUM PHOSPHATE 10 MG/ML IJ SOLN
INTRAMUSCULAR | Status: DC | PRN
  Administered 2017-08-17: 10 mg via INTRAVENOUS

## 2017-08-17 MED ORDER — PHENYLEPHRINE 40 MCG/ML (10ML) SYRINGE FOR IV PUSH (FOR BLOOD PRESSURE SUPPORT)
PREFILLED_SYRINGE | INTRAVENOUS | Status: DC | PRN
  Administered 2017-08-17: 80 ug via INTRAVENOUS

## 2017-08-17 MED ORDER — SUGAMMADEX SODIUM 200 MG/2ML IV SOLN
INTRAVENOUS | Status: AC
  Filled 2017-08-17: qty 2

## 2017-08-17 MED ORDER — METRONIDAZOLE IN NACL 5-0.79 MG/ML-% IV SOLN
500.0000 mg | INTRAVENOUS | Status: AC
  Administered 2017-08-17: 500 mg via INTRAVENOUS
  Filled 2017-08-17: qty 100

## 2017-08-17 MED ORDER — METOCLOPRAMIDE HCL 5 MG/ML IJ SOLN: 5.0000 mg | Freq: Four times a day (QID) | INTRAMUSCULAR | Status: DC | PRN

## 2017-08-17 MED ORDER — IOPAMIDOL (ISOVUE-300) INJECTION 61%
INTRAVENOUS | Status: AC
  Filled 2017-08-17: qty 50

## 2017-08-17 MED ORDER — FENTANYL CITRATE (PF) 100 MCG/2ML IJ SOLN: 25.0000 ug | INTRAMUSCULAR | Status: DC | PRN

## 2017-08-17 MED ORDER — GABAPENTIN 300 MG PO CAPS
ORAL_CAPSULE | ORAL | Status: AC
  Administered 2017-08-17: 300 mg via ORAL
  Filled 2017-08-17: qty 1

## 2017-08-17 MED ORDER — CHLORHEXIDINE GLUCONATE CLOTH 2 % EX PADS: 6.0000 | MEDICATED_PAD | Freq: Once | CUTANEOUS | Status: DC

## 2017-08-17 MED ORDER — PROMETHAZINE HCL 25 MG/ML IJ SOLN: 6.2500 mg | INTRAMUSCULAR | Status: DC | PRN

## 2017-08-17 MED ORDER — SODIUM CHLORIDE 0.9 % IR SOLN
Status: DC | PRN
  Administered 2017-08-17: 1000 mL

## 2017-08-17 MED ORDER — KETAMINE HCL-SODIUM CHLORIDE 100-0.9 MG/10ML-% IV SOSY
PREFILLED_SYRINGE | INTRAVENOUS | Status: AC
  Filled 2017-08-17: qty 10

## 2017-08-17 MED ORDER — IOPAMIDOL (ISOVUE-300) INJECTION 61%
INTRAVENOUS | Status: DC | PRN
  Administered 2017-08-17: 25 mL

## 2017-08-17 MED ORDER — DEXAMETHASONE SODIUM PHOSPHATE 10 MG/ML IJ SOLN
INTRAMUSCULAR | Status: AC
  Filled 2017-08-17: qty 1

## 2017-08-17 MED ORDER — SODIUM CHLORIDE 0.9 % IV SOLN: 8.0000 mg | Freq: Four times a day (QID) | INTRAVENOUS | Status: DC | PRN

## 2017-08-17 MED ORDER — OXYCODONE HCL 5 MG PO TABS: 5.0000 mg | ORAL_TABLET | ORAL | Status: DC | PRN

## 2017-08-17 MED ORDER — ONDANSETRON HCL 4 MG/2ML IJ SOLN
INTRAMUSCULAR | Status: AC
  Filled 2017-08-17: qty 2

## 2017-08-17 MED ORDER — MIDAZOLAM HCL 2 MG/2ML IJ SOLN
INTRAMUSCULAR | Status: AC
  Filled 2017-08-17: qty 2

## 2017-08-17 MED ORDER — BUPIVACAINE-EPINEPHRINE (PF) 0.5% -1:200000 IJ SOLN
INTRAMUSCULAR | Status: AC
  Filled 2017-08-17: qty 30

## 2017-08-17 MED ORDER — FENTANYL CITRATE (PF) 100 MCG/2ML IJ SOLN
INTRAMUSCULAR | Status: DC | PRN
  Administered 2017-08-17 (×2): 50 ug via INTRAVENOUS
  Administered 2017-08-17: 100 ug via INTRAVENOUS
  Administered 2017-08-17: 50 ug via INTRAVENOUS

## 2017-08-17 MED ORDER — 0.9 % SODIUM CHLORIDE (POUR BTL) OPTIME
TOPICAL | Status: DC | PRN
  Administered 2017-08-17 (×2): 1000 mL

## 2017-08-17 MED ORDER — KETOROLAC TROMETHAMINE 30 MG/ML IJ SOLN
INTRAMUSCULAR | Status: AC
  Filled 2017-08-17: qty 1

## 2017-08-17 MED ORDER — ONDANSETRON HCL 4 MG/2ML IJ SOLN: 4.0000 mg | Freq: Four times a day (QID) | INTRAMUSCULAR | Status: DC | PRN

## 2017-08-17 MED ORDER — SCOPOLAMINE 1 MG/3DAYS TD PT72
MEDICATED_PATCH | TRANSDERMAL | Status: AC
  Filled 2017-08-17: qty 1

## 2017-08-17 MED ORDER — CEFAZOLIN SODIUM-DEXTROSE 2-4 GM/100ML-% IV SOLN
2.0000 g | INTRAVENOUS | Status: AC
  Administered 2017-08-17: 2 g via INTRAVENOUS

## 2017-08-17 MED ORDER — SODIUM CHLORIDE 0.9 % IV SOLN: 250.0000 mL | INTRAVENOUS | Status: DC | PRN

## 2017-08-17 MED ORDER — FENTANYL CITRATE (PF) 250 MCG/5ML IJ SOLN
INTRAMUSCULAR | Status: AC
  Filled 2017-08-17: qty 5

## 2017-08-17 MED ORDER — ROCURONIUM BROMIDE 50 MG/5ML IV SOSY
PREFILLED_SYRINGE | INTRAVENOUS | Status: DC | PRN
  Administered 2017-08-17: 50 mg via INTRAVENOUS

## 2017-08-17 MED ORDER — KETOROLAC TROMETHAMINE 30 MG/ML IJ SOLN
INTRAMUSCULAR | Status: DC | PRN
  Administered 2017-08-17: 30 mg via INTRAVENOUS

## 2017-08-17 MED ORDER — PHENYLEPHRINE 40 MCG/ML (10ML) SYRINGE FOR IV PUSH (FOR BLOOD PRESSURE SUPPORT)
PREFILLED_SYRINGE | INTRAVENOUS | Status: AC
  Filled 2017-08-17: qty 10

## 2017-08-17 MED ORDER — MAGIC MOUTHWASH: 15.0000 mL | Freq: Four times a day (QID) | ORAL | Status: DC | PRN

## 2017-08-17 MED ORDER — ACETAMINOPHEN 650 MG RE SUPP: 650.0000 mg | RECTAL | Status: DC | PRN

## 2017-08-17 MED ORDER — PROPOFOL 10 MG/ML IV BOLUS
INTRAVENOUS | Status: DC | PRN
  Administered 2017-08-17: 150 mg via INTRAVENOUS

## 2017-08-17 MED ORDER — ACETAMINOPHEN 500 MG PO TABS
1000.0000 mg | ORAL_TABLET | ORAL | Status: AC
  Administered 2017-08-17: 1000 mg via ORAL
  Filled 2017-08-17: qty 2

## 2017-08-17 MED ORDER — LIDOCAINE 2% (20 MG/ML) 5 ML SYRINGE
INTRAMUSCULAR | Status: AC
  Filled 2017-08-17: qty 5

## 2017-08-17 MED ORDER — STERILE WATER FOR IRRIGATION IR SOLN
Status: DC | PRN
  Administered 2017-08-17: 1000 mL

## 2017-08-17 MED ORDER — MIDAZOLAM HCL 2 MG/2ML IJ SOLN
INTRAMUSCULAR | Status: DC | PRN
  Administered 2017-08-17: 2 mg via INTRAVENOUS

## 2017-08-17 MED ORDER — LACTATED RINGERS IV BOLUS (SEPSIS): 1000.0000 mL | Freq: Three times a day (TID) | INTRAVENOUS | Status: DC | PRN

## 2017-08-17 MED ORDER — TRAMADOL HCL 50 MG PO TABS: 50.0000 mg | ORAL_TABLET | Freq: Four times a day (QID) | ORAL | 0 refills | Status: AC | PRN

## 2017-08-17 MED ORDER — PROCHLORPERAZINE MALEATE 5 MG PO TABS: 5.0000 mg | ORAL_TABLET | Freq: Four times a day (QID) | ORAL | 1 refills | Status: AC | PRN

## 2017-08-17 MED ORDER — ONDANSETRON HCL 4 MG/2ML IJ SOLN
INTRAMUSCULAR | Status: DC | PRN
  Administered 2017-08-17: 4 mg via INTRAVENOUS

## 2017-08-17 MED ORDER — BUPIVACAINE-EPINEPHRINE (PF) 0.5% -1:200000 IJ SOLN
INTRAMUSCULAR | Status: DC | PRN
  Administered 2017-08-17: 30 mL

## 2017-08-17 MED ORDER — SODIUM CHLORIDE 0.9% FLUSH: 3.0000 mL | INTRAVENOUS | Status: DC | PRN

## 2017-08-17 MED ORDER — CELECOXIB 200 MG PO CAPS
ORAL_CAPSULE | ORAL | Status: AC
  Administered 2017-08-17: 200 mg via ORAL
  Filled 2017-08-17: qty 1

## 2017-08-17 MED ORDER — LACTATED RINGERS IV SOLN
INTRAVENOUS | Status: DC
  Administered 2017-08-17 (×2): via INTRAVENOUS

## 2017-08-17 MED ORDER — GABAPENTIN 300 MG PO CAPS
300.0000 mg | ORAL_CAPSULE | ORAL | Status: AC
  Administered 2017-08-17: 300 mg via ORAL

## 2017-08-17 MED ORDER — ACETAMINOPHEN 325 MG PO TABS: 650.0000 mg | ORAL_TABLET | ORAL | Status: DC | PRN

## 2017-08-17 MED ORDER — SUGAMMADEX SODIUM 200 MG/2ML IV SOLN
INTRAVENOUS | Status: DC | PRN
  Administered 2017-08-17: 200 mg via INTRAVENOUS

## 2017-08-17 MED ORDER — LIDOCAINE 2% (20 MG/ML) 5 ML SYRINGE
INTRAMUSCULAR | Status: DC | PRN
  Administered 2017-08-17: 100 mg via INTRAVENOUS

## 2017-08-17 MED ORDER — ROCURONIUM BROMIDE 10 MG/ML (PF) SYRINGE
PREFILLED_SYRINGE | INTRAVENOUS | Status: AC
  Filled 2017-08-17: qty 5

## 2017-08-17 SURGICAL SUPPLY — 52 items
APPLIER CLIP 5 13 M/L LIGAMAX5 (MISCELLANEOUS) ×3
APR CLP MED LRG 5 ANG JAW (MISCELLANEOUS) ×1
BAG SPEC RTRVL LRG 6X4 10 (ENDOMECHANICALS)
BLADE CLIPPER SURG (BLADE) ×2 IMPLANT
CANISTER SUCT 3000ML PPV (MISCELLANEOUS) ×3 IMPLANT
CHLORAPREP W/TINT 26ML (MISCELLANEOUS) ×3 IMPLANT
CLIP APPLIE 5 13 M/L LIGAMAX5 (MISCELLANEOUS) ×1 IMPLANT
COVER MAYO STAND STRL (DRAPES) ×3 IMPLANT
COVER SURGICAL LIGHT HANDLE (MISCELLANEOUS) ×3 IMPLANT
DRAPE C-ARM 42X72 X-RAY (DRAPES) ×3 IMPLANT
DRAPE WARM FLUID 44X44 (DRAPE) ×3 IMPLANT
DRSG TEGADERM 4X4.75 (GAUZE/BANDAGES/DRESSINGS) ×3 IMPLANT
ELECT REM PT RETURN 9FT ADLT (ELECTROSURGICAL) ×3
ELECTRODE REM PT RTRN 9FT ADLT (ELECTROSURGICAL) ×1 IMPLANT
ENDOLOOP SUT PDS II  0 18 (SUTURE) ×2
ENDOLOOP SUT PDS II 0 18 (SUTURE) IMPLANT
GAUZE SPONGE 2X2 8PLY STRL LF (GAUZE/BANDAGES/DRESSINGS) ×1 IMPLANT
GLOVE BIO SURGEON STRL SZ7 (GLOVE) ×2 IMPLANT
GLOVE BIO SURGEON STRL SZ7.5 (GLOVE) ×2 IMPLANT
GLOVE BIOGEL PI IND STRL 7.0 (GLOVE) IMPLANT
GLOVE BIOGEL PI IND STRL 7.5 (GLOVE) IMPLANT
GLOVE BIOGEL PI IND STRL 8 (GLOVE) ×1 IMPLANT
GLOVE BIOGEL PI INDICATOR 7.0 (GLOVE) ×2
GLOVE BIOGEL PI INDICATOR 7.5 (GLOVE) ×2
GLOVE BIOGEL PI INDICATOR 8 (GLOVE) ×2
GLOVE ECLIPSE 8.0 STRL XLNG CF (GLOVE) ×3 IMPLANT
GLOVE SURG SS PI 6.0 STRL IVOR (GLOVE) ×2 IMPLANT
GOWN STRL REUS W/ TWL LRG LVL3 (GOWN DISPOSABLE) ×2 IMPLANT
GOWN STRL REUS W/ TWL XL LVL3 (GOWN DISPOSABLE) ×1 IMPLANT
GOWN STRL REUS W/TWL LRG LVL3 (GOWN DISPOSABLE) ×6
GOWN STRL REUS W/TWL XL LVL3 (GOWN DISPOSABLE) ×3
KIT BASIN OR (CUSTOM PROCEDURE TRAY) ×3 IMPLANT
KIT TURNOVER KIT B (KITS) ×2 IMPLANT
NEEDLE 22X1 1/2 (OR ONLY) (NEEDLE) ×3 IMPLANT
NS IRRIG 1000ML POUR BTL (IV SOLUTION) ×5 IMPLANT
PAD ARMBOARD 7.5X6 YLW CONV (MISCELLANEOUS) ×6 IMPLANT
POUCH SPECIMEN RETRIEVAL 10MM (ENDOMECHANICALS) IMPLANT
SCISSORS LAP 5X35 DISP (ENDOMECHANICALS) ×3 IMPLANT
SET CHOLANGIOGRAPH 5 50 .035 (SET/KITS/TRAYS/PACK) ×3 IMPLANT
SET IRRIG TUBING LAPAROSCOPIC (IRRIGATION / IRRIGATOR) ×3 IMPLANT
SHEARS HARMONIC ACE PLUS 36CM (ENDOMECHANICALS) ×3 IMPLANT
SPECIMEN JAR SMALL (MISCELLANEOUS) ×3 IMPLANT
SPONGE GAUZE 2X2 STER 10/PKG (GAUZE/BANDAGES/DRESSINGS) ×2
SUT MNCRL AB 4-0 PS2 18 (SUTURE) ×3 IMPLANT
SUT PDS AB 1 CT  36 (SUTURE) ×8
SUT PDS AB 1 CT 36 (SUTURE) IMPLANT
SUT VICRYL 0 TIES 12 18 (SUTURE) IMPLANT
TRAY LAPAROSCOPIC MC (CUSTOM PROCEDURE TRAY) ×3 IMPLANT
TROCAR 5M 150ML BLDLS (TROCAR) ×3 IMPLANT
TROCAR XCEL NON-BLD 5MMX100MML (ENDOMECHANICALS) ×3 IMPLANT
TUBING INSUFFLATION (TUBING) ×3 IMPLANT
WATER STERILE IRR 1000ML POUR (IV SOLUTION) ×3 IMPLANT

## 2017-08-17 NOTE — Discharge Instructions (Signed)
LAPAROSCOPIC SURGERY: POST OP INSTRUCTIONS ° °###################################################################### ° °EAT °Gradually transition to a high fiber diet with a fiber supplement over the next few weeks after discharge.  Start with a pureed / full liquid diet (see below) ° °WALK °Walk an hour a day.  Control your pain to do that.   ° °CONTROL PAIN °Control pain so that you can walk, sleep, tolerate sneezing/coughing, go up/down stairs. ° °HAVE A BOWEL MOVEMENT DAILY °Keep your bowels regular to avoid problems.  OK to try a laxative to override constipation.  OK to use an antidairrheal to slow down diarrhea.  Call if not better after 2 tries ° °CALL IF YOU HAVE PROBLEMS/CONCERNS °Call if you are still struggling despite following these instructions. °Call if you have concerns not answered by these instructions ° °###################################################################### ° ° ° °1. DIET: Follow a light bland diet the first 24 hours after arrival home, such as soup, liquids, crackers, etc.  Be sure to include lots of fluids daily.  Avoid fast food or heavy meals as your are more likely to get nauseated.  Eat a low fat the next few days after surgery.   °2. Take your usually prescribed home medications unless otherwise directed. °3. PAIN CONTROL: °a. Pain is best controlled by a usual combination of three different methods TOGETHER: °i. Ice/Heat °ii. Over the counter pain medication °iii. Prescription pain medication °b. Most patients will experience some swelling and bruising around the incisions.  Ice packs or heating pads (30-60 minutes up to 6 times a day) will help. Use ice for the first few days to help decrease swelling and bruising, then switch to heat to help relax tight/sore spots and speed recovery.  Some people prefer to use ice alone, heat alone, alternating between ice & heat.  Experiment to what works for you.  Swelling and bruising can take several weeks to resolve.   °c. It is  helpful to take an over-the-counter pain medication regularly for the first few weeks.  Choose one of the following that works best for you: °i. Naproxen (Aleve, etc)  Two 220mg tabs twice a day °ii. Ibuprofen (Advil, etc) Three 200mg tabs four times a day (every meal & bedtime) °iii. Acetaminophen (Tylenol, etc) 500-650mg four times a day (every meal & bedtime) °d. A  prescription for pain medication (such as oxycodone, hydrocodone, etc) should be given to you upon discharge.  Take your pain medication as prescribed.  °i. If you are having problems/concerns with the prescription medicine (does not control pain, nausea, vomiting, rash, itching, etc), please call us [phone] to see if we need to switch you to a different pain medicine that will work better for you and/or control your side effect better. °ii. If you need a refill on your pain medication, please contact your pharmacy.  They will contact our office to request authorization. Prescriptions will not be filled after 5 pm or on week-ends. °4. Avoid getting constipated.  Between the surgery and the pain medications, it is common to experience some constipation.  Increasing fluid intake and taking a fiber supplement (such as Metamucil, Citrucel, FiberCon, MiraLax, etc) 1-2 times a day regularly will usually help prevent this problem from occurring.  A mild laxative (prune juice, Milk of Magnesia, MiraLax, etc) should be taken according to package directions if there are no bowel movements after 48 hours.   °5. Watch out for diarrhea.  If you have many loose bowel movements, simplify your diet to bland foods & liquids for   a few days.  Stop any stool softeners and decrease your fiber supplement.  Switching to mild anti-diarrheal medications (Kayopectate, Pepto Bismol) can help.  If this worsens or does not improve, please call us. °6. Wash / shower every day.  You may shower over the dressings as they are waterproof.  Continue to shower over incision(s)  after the dressing is off. °7. Remove your waterproof bandages 5 days after surgery.  You may leave the incision open to air.  You may replace a dressing/Band-Aid to cover the incision for comfort if you wish.  °8. ACTIVITIES as tolerated:   °a. You may resume regular (light) daily activities beginning the next day--such as daily self-care, walking, climbing stairs--gradually increasing activities as tolerated.  If you can walk 30 minutes without difficulty, it is safe to try more intense activity such as jogging, treadmill, bicycling, low-impact aerobics, swimming, etc. °b. Save the most intensive and strenuous activity for last such as sit-ups, heavy lifting, contact sports, etc  Refrain from any heavy lifting or straining until you are off narcotics for pain control.   °c. DO NOT PUSH THROUGH PAIN.  Let pain be your guide: If it hurts to do something, don't do it.  Pain is your body warning you to avoid that activity for another week until the pain goes down. °d. You may drive when you are no longer taking prescription pain medication, you can comfortably wear a seatbelt, and you can safely maneuver your car and apply brakes. °e. You may have sexual intercourse when it is comfortable.  °9. FOLLOW UP in our office °a. Please call [facility] at [phone] to set up an appointment to see your surgeon in the office for a follow-up appointment approximately 2-3 weeks after your surgery. °b. Make sure that you call for this appointment the day you arrive home to insure a convenient appointment time. °10. IF YOU HAVE DISABILITY OR FAMILY LEAVE FORMS, BRING THEM TO THE OFFICE FOR PROCESSING.  DO NOT GIVE THEM TO YOUR DOCTOR. ° ° °WHEN TO CALL US [phone]: °1. Poor pain control °2. Reactions / problems with new medications (rash/itching, nausea, etc)  °3. Fever over 101.5 F (38.5 C) °4. Inability to urinate °5. Nausea and/or vomiting °6. Worsening swelling or bruising °7. Continued bleeding from incision. °8. Increased  pain, redness, or drainage from the incision ° ° The clinic staff is available to answer your questions during regular business hours (8:30am-5pm).  Please don’t hesitate to call and ask to speak to one of our nurses for clinical concerns.  ° If you have a medical emergency, go to the nearest emergency room or call 911. ° A surgeon from [facility] is always on call at the hospitals ° ° °[facility] °[address], [address], [place], [place]  [address] ? °MAIN: [phone] ? TOLL FREE: [phone] ?  °FAX [phone] °[email or web address] ° ° ° °Cholecystitis °Cholecystitis is inflammation of the gallbladder. It is often called a gallbladder attack. The gallbladder is a pear-shaped organ that lies beneath the liver on the right side of the body. The gallbladder stores bile, which is a fluid that helps the body to digest fats. If bile builds up in your gallbladder, your gallbladder becomes inflamed. This condition may occur suddenly (be acute). Repeat episodes of acute cholecystitis or prolonged episodes may lead to a long-term (chronic) condition. Cholecystitis is serious and it requires treatment. °What are the causes? °The most common cause of this   condition is gallstones. Gallstones can block the tube (duct) that carries bile out of your gallbladder. This causes bile to build up. Other causes of this condition include: °· Damage to the gallbladder due to a decrease in blood flow. °· Infections in the bile ducts. °· Scars or kinks in the bile ducts. °· Tumors in the liver, pancreas, or gallbladder. ° °What increases the risk? °This condition is more likely to develop in: °· People who have sickle cell disease. °· People who take birth control pills or use estrogen. °· People who have alcoholic liver disease. °· People who have liver cirrhosis. °· People who have their nutrition delivered through a vein (parenteral nutrition). °· People who do not eat or drink  (do fasting) for a long period of time. °· People who are obese. °· People who have rapid weight loss. °· People who are pregnant. °· People who have increased triglyceride levels. °· People who have pancreatitis. ° °What are the signs or symptoms? °Symptoms of this condition include: °· Abdominal pain, especially in the upper right area of the abdomen. °· Abdominal tenderness or bloating. °· Nausea. °· Vomiting. °· Fever. °· Chills. °· Yellowing of the skin and the whites of the eyes (jaundice). ° °How is this diagnosed? °This condition is diagnosed with a medical history and physical exam. You may also have other tests, including: °· Imaging tests, such as: °? An ultrasound of the gallbladder. °? A CT scan of the abdomen. °? A gallbladder nuclear scan (HIDA scan). This scan allows your health care provider to see the bile moving from your liver to your gallbladder and to your small intestine. °? MRI. °· Blood tests, such as: °? A complete blood count, because the white blood cell count may be higher than normal. °? Liver function tests, because some levels may be higher than normal with certain types of gallstones. ° °How is this treated? °Treatment may include: °· Fasting for a certain amount of time. °· IV fluids. °· Medicine to treat pain or vomiting. °· Antibiotic medicine. °· Surgery to remove your gallbladder (cholecystectomy). This may happen immediately or at a later time. ° °Follow these instructions at home: °Home care will depend on your treatment. In general: °· Take over-the-counter and prescription medicines only as told by your health care provider. °· If you were prescribed an antibiotic medicine, take it as told by your health care provider. Do not stop taking the antibiotic even if you start to feel better. °· Follow instructions from your health care provider about what to eat or drink. When you are allowed to eat, avoid eating or drinking anything that triggers your symptoms. °· Keep all  follow-up visits as told by your health care provider. This is important. ° °Contact a health care provider if: °· Your pain is not controlled with medicine. °· You have a fever. °Get help right away if: °· Your pain moves to another part of your abdomen or to your back. °· You continue to have symptoms or you develop new symptoms even with treatment. °This information is not intended to replace advice given to you by your health care provider. Make sure you discuss any questions you have with your health care provider. °Document Released: 07/10/2005 Document Revised: 11/18/2015 Document Reviewed: 10/21/2014 °[employer] Interactive Patient Education © 2018 [employer] ° °

## 2017-08-17 NOTE — Transfer of Care (Signed)
Immediate Anesthesia Transfer of Care Note  Patient: Carol Logan  Procedure(s) Performed: LAPAROSCOPIC CHOLECYSTECTOMY SINGLE SITE WITH INTRAOPERATIVE CHOLANGIOGRAM (N/A Abdomen)  Patient Location: PACU  Anesthesia Type:General  Level of Consciousness: drowsy and patient cooperative  Airway & Oxygen Therapy: Patient Spontanous Breathing and Patient connected to face mask oxygen  Post-op Assessment: Report given to RN and Post -op Vital signs reviewed and stable  Post vital signs: Reviewed and stable  Last Vitals:  Vitals:   08/17/17 1351 08/17/17 1617  BP: 118/87 (!) 141/87  Pulse: 62 77  Resp: 18 13  Temp: 36.7 C 36.7 C  SpO2: 100% 100%    Last Pain:  Vitals:   08/17/17 1617  TempSrc:   PainSc: (P) Asleep      Patients Stated Pain Goal: 0 (48/25/00 3704)  Complications: No apparent anesthesia complications

## 2017-08-17 NOTE — Interval H&P Note (Signed)
History and Physical Interval Note:  08/17/2017 2:04 PM  Carol Logan  has presented today for surgery, with the diagnosis of symptomatic biliary colic probable chronic cholecystitis  The various methods of treatment have been discussed with the patient and family. After consideration of risks, benefits and other options for treatment, the patient has consented to  Procedure(s): [place] CHOLANGIOGRAM (N/A) as a surgical intervention .  The patient's history has been reviewed, patient examined, no change in status, stable for surgery.  I have reviewed the patient's chart and labs.  Questions were answered to the patient's satisfaction.    I have re-reviewed the the patient's records, history, medications, and allergies.  I have re-examined the patient.  I again discussed intraoperative plans and goals of post-operative recovery.  The patient agrees to proceed.  Carol Logan  07-17-1976 [id number]  Patient Care Team: Carlyle Dolly, MD as PCP - General (Family Medicine) Nunzio Cobbs, MD as Consulting Physician (Obstetrics and Gynecology) Michael Boston, MD as Consulting Physician (General Surgery) Irene Shipper, MD as Consulting Physician (Gastroenterology)  Patient Active Problem List   Diagnosis Date Noted  . Status post laparoscopic hysterectomy 06/11/2017  . 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 Menorrhagia 09/11/2016  . Routine general medical examination at a health care facility 09/21/2015  . Hyperlipidemia 06/22/2014  . Obesity 06/17/2014  . Uterine leiomyoma 06/17/2014  . Allergic rhinitis 06/17/2014    Past Medical History:  Diagnosis Date  . Abnormal uterine bleeding   . Anemia   . Endometriosis   . Fibroid   . Fibroids   . Gallstones   . Headache    with periods  . No pertinent past medical history   . Ovarian cyst   . SVD  (spontaneous vaginal delivery)    x 2    Past Surgical History:  Procedure Laterality Date  . ABDOMINAL HYSTERECTOMY    . CERVICAL CERCLAGE N/A 04/01/2013   Procedure: CERCLAGE CERVICAL;  Surgeon: [name], MD;  Location: [facility];  Service: Gynecology;  Laterality: N/A;  . CESAREAN SECTION N/A 09/18/2013   Procedure: CESAREAN SECTION;  Surgeon: [name], MD;  Location: [facility];  Service: Obstetrics;  Laterality: N/A;  . CYSTOSCOPY N/A 06/11/2017   Procedure: CYSTOSCOPY;  Surgeon: Nunzio Cobbs, MD;  Location: [facility];  Service: Gynecology;  Laterality: N/A;  . FOOT SURGERY Bilateral    buinonectomy  . FRACTURE SURGERY Right 1992   knee surgery  . LYSIS OF ADHESION N/A 06/11/2017   Procedure: LYSIS OF OMENTAL ADHESIONS;  Surgeon: Nunzio Cobbs, MD;  Location: [facility];  Service: Gynecology;  Laterality: N/A;  . MYOMECTOMY  10/11/2011   Procedure: MYOMECTOMY;  Surgeon: [name], MD;  Location: [facility];  Service: Gynecology;  Laterality: N/A;  . TOTAL LAPAROSCOPIC HYSTERECTOMY WITH SALPINGECTOMY Bilateral 06/11/2017   Procedure: TOTAL LAPAROSCOPIC HYSTERECTOMY WITH SALPINGECTOMY;  Surgeon: Nunzio Cobbs, MD;  Location: [facility];  Service: Gynecology;  Laterality: Bilateral;  . TUBAL LIGATION    . WISDOM TOOTH EXTRACTION      Social History   Socioeconomic History  . Marital status: Single    Spouse name: Not on file  . Number of children: 1  . Years of education: college  . Highest education level: Not on file  Social Needs  . Financial resource strain: Not on  file  . Food insecurity - worry: Not on file  . Food insecurity - inability: Not on file  . Transportation needs - medical: Not on file  . Transportation needs - non-medical: Not on file  Occupational History  . 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 Smoking status: Never Smoker  . Smokeless tobacco: Never  Used  Substance and Sexual Activity  . Alcohol use: No    Comment: socially- 1 glass of wine monthly  . Drug use: No  . Sexual activity: Yes    Partners: Male    Birth control/protection: Surgical    Comment: [facility]  Other Topics Concern  . Not on file  Social History Narrative   Pt lives at home with her spouse and son.   Caffeine Use- Consumes coffee/tea twice a week    Family History  Problem Relation Age of Onset  . High blood pressure Mother   . Hypertension Mother   . Glaucoma Paternal Uncle   . Alzheimer's disease Paternal Uncle   . Hypertension Sister   . Other Sister        GYN complications  . Other Brother        Homicide  . Hypertension Maternal Grandmother   . Heart disease Maternal Grandmother   . Hypertension Paternal Grandmother   . Stroke Paternal Grandmother   . Stroke Paternal Grandfather   . Ovarian cancer Other   . Depression Neg Hx   . Diabetes Neg Hx   . Drug abuse Neg Hx   . Early death Neg Hx   . Alcohol abuse Neg Hx   . Hyperlipidemia Neg Hx   . Kidney disease Neg Hx   . Colon cancer Neg Hx   . Stomach cancer Neg Hx     Medications Prior to Admission  Medication Sig Dispense Refill Last Dose  . ibuprofen (ADVIL,MOTRIN) 200 MG tablet Take 400 mg by mouth every 6 (six) hours as needed for moderate pain.   Past Week at Unknown time  . MELATONIN PO Take 1 tablet by mouth at bedtime as needed (sleep).   Past Week at Unknown time  . Multiple Vitamin (MULTIVITAMIN WITH MINERALS) TABS tablet Take 1 tablet daily by mouth.   Past Month at Unknown time  . ondansetron (ZOFRAN) 4 MG tablet Take 1 tablet (4 mg total) by mouth every 8 (eight) hours as needed for nausea or vomiting. 30 tablet 0 Past Week at Unknown time  . oxyCODONE-acetaminophen (PERCOCET/ROXICET) 5-325 MG tablet Take 1-2 tablets by mouth every 6 (six) hours as needed. 15 tablet 0 Past Week at Unknown time  . polyethylene glycol powder (GLYCOLAX/MIRALAX) powder Take 17 g by mouth daily as  needed for moderate constipation. 500 g 2 Unknown at Unknown time    Current Facility-Administered Medications  Medication Dose Route Frequency Provider Last Rate Last Dose  . bupivacaine liposome (EXPAREL) 1.3 % injection 266 mg  20 mL Infiltration To Rosemarie Beath, MD      . Derrill Memo ON 08/18/2017] ceFAZolin (ANCEF) IVPB 2g/100 mL premix  2 g Intravenous On Call to OR Michael Boston, MD       And  . Derrill Memo ON 08/18/2017] metroNIDAZOLE (FLAGYL) IVPB 500 mg  500 mg Intravenous On Call to OR Michael Boston, MD      . Chlorhexidine Gluconate Cloth 2 % PADS 6 each  6 each Topical Once Michael Boston, MD  And  . Chlorhexidine Gluconate Cloth 2 % PADS 6 each  6 each Topical Once Michael Boston, MD      . lactated ringers infusion   Intravenous Continuous Catalina Gravel, MD 10 mL/hr at 08/17/17 1359       No Known Allergies  BP 118/87   Pulse 62   Temp 98.1 F (36.7 C) (Oral)   Resp 18   LMP 05/30/2017   SpO2 100%   Labs: No results found for this or any previous visit (from the past 48 hour(s)).  Imaging / Studies: Ct Abdomen Pelvis W Contrast  Result Date: 08/05/2017 CLINICAL DATA:  42 year old female with abdominal pain, nausea vomiting. Total laparoscopic hysterectomy on 06/11/2017. EXAM: CT ABDOMEN AND PELVIS WITH CONTRAST TECHNIQUE: Multidetector CT imaging of the abdomen and pelvis was performed using the standard protocol following bolus administration of intravenous contrast. CONTRAST:  116mL ISOVUE-300 IOPAMIDOL (ISOVUE-300) INJECTION 61% COMPARISON:  Pelvic ultrasound dated 05/24/2017 FINDINGS: Lower chest: The visualized lung bases are clear. No intra-abdominal free air or free fluid. Hepatobiliary: The liver is unremarkable. No intrahepatic biliary duct dilatation. The gallbladder is mildly distended. There are multiple large stones in the gallbladder each measuring approximately 2 cm and extending from the gallbladder fundus into the gallbladder neck. Smaller stones  noted in the gallbladder fundus. There may be mild thickening of the gallbladder wall and mild pericholecystic stranding. Ultrasound is recommended for better evaluation of possible early acute cholecystitis. There is a 3 mm stone in the central CBD at the head of the pancreas (coronal image 49). Pancreas: Unremarkable. No pancreatic ductal dilatation or surrounding inflammatory changes. Spleen: Normal in size without focal abnormality. Adrenals/Urinary Tract: Adrenal glands are unremarkable. Kidneys are normal, without renal calculi, focal lesion, or hydronephrosis. Bladder is unremarkable. Stomach/Bowel: Moderate stool noted throughout the colon. There is no bowel obstruction or active inflammation. Normal appendix. Vascular/Lymphatic: Mild atherosclerotic disease. The abdominal aorta and IVC are otherwise unremarkable. No portal venous gas. No adenopathy. Reproductive: Hysterectomy. The left ovary is unremarkable. There is a 2.2 x 1.5 cm dominant follicle or corpus luteum in the right ovary. Other: None Musculoskeletal: No acute or significant osseous findings. IMPRESSION: 1. Multiple large stones in the gallbladder as well as a 3 mm calculus in the central CBD. Probable trace pericholecystic fluid. Further evaluation with ultrasound recommended to evaluate for possibility of early cholecystitis. 2. Moderate colonic stool burden. No bowel obstruction or active inflammation. Normal appendix. 3. Hysterectomy. A 2.2 cm dominant follicle or corpus luteum in the right ovary. Electronically Signed   By: Anner Crete M.D.   On: 08/05/2017 03:11     .[name], M.D., F.A.C.S. Gastrointestinal and Minimally Invasive Surgery [facility] [address] [place], [place] [address] [phone] Main / Paging  08/17/2017 2:04 PM     [name]

## 2017-08-17 NOTE — Anesthesia Procedure Notes (Signed)
Procedure Name: Intubation Date/Time: 08/17/2017 2:37 PM Performed by: Genelle Bal, CRNA Pre-anesthesia Checklist: Patient identified, Emergency Drugs available, Suction available and Patient being monitored Patient Re-evaluated:Patient Re-evaluated prior to induction Oxygen Delivery Method: Circle system utilized Preoxygenation: Pre-oxygenation with 100% oxygen Induction Type: IV induction Ventilation: Mask ventilation without difficulty Laryngoscope Size: Miller and 2 Grade View: Grade I Tube type: Oral Tube size: 7.0 mm Number of attempts: 1 Airway Equipment and Method: Stylet and Oral airway Placement Confirmation: ETT inserted through vocal cords under direct vision,  positive ETCO2 and breath sounds checked- equal and bilateral Secured at: 21 cm Tube secured with: Tape Dental Injury: Teeth and Oropharynx as per pre-operative assessment

## 2017-08-17 NOTE — Op Note (Signed)
08/17/2017  PATIENT:  Carol Logan  42 y.o. female  Patient Care Team: Carlyle Dolly, MD as PCP - General (Family Medicine) Yisroel Ramming, Everardo All, MD as Consulting Physician (Obstetrics and Gynecology) Michael Boston, MD as Consulting Physician (General Surgery) Irene Shipper, MD as Consulting Physician (Gastroenterology)  PRE-OPERATIVE DIAGNOSIS:    Chronic Calculus cholecystitis  POST-OPERATIVE DIAGNOSIS:   Chronic Calculus cholecystitis  Liver: Fatty steatohepatitis  PROCEDURE:  SINGLE SITE Laparoscopic cholecystectomy with intraoperative cholangiogram  SURGEON:  [name], MD, FACS.  ASSISTANT: RNFA   ANESTHESIA:    General with endotracheal intubation Local anesthetic as a field block  EBL:  (See Anesthesia Intraoperative Record) Total I/O In: 1200 [I.V.:1200] Out: 20 [Blood:20]  Delay start of Pharmacological VTE agent (>24hrs) due to surgical blood loss or risk of bleeding:  no  DRAINS: None   SPECIMEN: Gallbladder    DISPOSITION OF SPECIMEN:  PATHOLOGY  COUNTS:  YES  PLAN OF CARE: Discharge to home after PACU  PATIENT DISPOSITION:  PACU - hemodynamically stable.  INDICATION: 42 year old female with intermittent episodes of nausea vomiting.  Dwindling tolerance of oral intake to the point of having problems even with liquids.  The chronic right upper quadrant pain.  Concerning for end-stage chronic cholecystitis.  I recommended urgent cholecystectomy  The anatomy & physiology of hepatobiliary & pancreatic function was discussed.  The pathophysiology of gallbladder dysfunction was discussed.  Natural history risks without surgery was discussed.   I feel the risks of no intervention will lead to serious problems that outweigh the operative risks; therefore, I recommended cholecystectomy to remove the pathology.  I explained laparoscopic techniques with possible need for an open approach.  Probable cholangiogram to evaluate the bilary tract  was explained as well.    Risks such as bleeding, infection, abscess, leak, injury to other organs, need for further treatment, heart attack, death, and other risks were discussed.  I noted a good likelihood this will help address the problem.  Possibility that this will not correct all abdominal symptoms was explained.  Goals of post-operative recovery were discussed as well.  We will work to minimize complications.  An educational handout further explaining the pathology and treatment options was given as well.  Questions were answered.  The patient expresses understanding & wishes to proceed with surgery.  OR FINDINGS: Enlarged gallbladder with very large gallstones within it.  Thickened with moderate adhesions consistent with chronic cholecystitis.  Cholangiogram with lobular ellipsoid hyperlucency in distal common bile duct most likely consistent with gas bubble.  Nonobstructing.  Liver: Cirrhosis  DESCRIPTION:   The patient was identified & brought in the operating room. The patient was positioned supine with arms tucked. SCDs were active during the entire case. The patient underwent general anesthesia without any difficulty.  The abdomen was prepped and draped in a sterile fashion. A Surgical Timeout confirmed our plan.  I made a transverse curvilinear incision through the superior umbilical fold.  I placed a 61mm long port through the supraumbilical fascia using a modified Hassan cutdown technique with umbilical stalk fascial countertraction. I began carbon dioxide insufflation.  No change in end tidal CO2 measurement.   Camera inspection revealed no injury. There were no adhesions to the anterior abdominal wall supraumbilically.  I proceeded to continue with single site technique. I placed a #5 port in left upper aspect of the wound. I placed a 5 mm atraumatic grasper in the right inferior aspect of the wound.  I turned attention  to the right upper quadrant.  Moderate adhesions of greater  omentum and mesocolon freed off to expose the gallbladder better.  The gallbladder fundus was elevated cephalad. I freed adhesions to the ventral surface of the gallbladder off carefully.   Duodenal sweep carefully freed off to expose the infundibulum.  I freed the peritoneal coverings between the gallbladder and the liver on the posteriolateral and anteriomedial walls. I alternated between Harmonic & blunt Maryland dissection to help get a good critical view of the cystic artery and cystic duct. I did further dissection to free a few centimeters of the gallbladder off the liver bed to get a good critical view of the infundibulum and cystic duct. I dissected out the cystic artery; and, after getting a good 360 view, ligated the anterior & posterior branches of the cystic artery close on the infundibulum using the Harmonic ultrasonic dissection.  I skeletonized the cystic duct.  I placed a clip on the infundibulum. I did a partial cystic duct-otomy and ensured patency. I placed a 5 [place] cholangiocatheter through a puncture site at the right subcostal ridge of the abdominal wall and directed it into the cystic duct.  We ran a cholangiogram with dilute radio-opaque contrast and continuous fluoroscopy.  On the first run there was leaking of contrast.  Therefore I repeated dissection and dissected on the cystic duct more proximally to reach the cystic/common bile duct junction.  Replaced the cholangiogram catheter a little more deeply.  Contrast flowed from a side branch consistent with cystic duct cannulization. Contrast flowed up the common hepatic duct into the right and left intrahepatic chains out to secondary radicals. Contrast flowed down the common bile duct easily across the normal ampulla into the duodenum.  Possible hyperlucency lobular and ellipsoid changing borders most likely consistent with possible gas bubble in the distal common bile duct.  Nonobstructing and no change in flow.  No ductal  dilatation.  Do not seem consistent with a stone.  Otherwise biliary anatomy was rather classic and consistent with a normal cholangiogram.  I removed the cholangiocatheter.  Because the cystic duct seemed somewhat thinned out and inflamed, I decided to ligate the cystic duct stump with an Endoloop.  Before that, I freed the gallbladder from its remaining few attachments on the liver bed so that I could lasso the entire gallbladder down the infundibulum and the base of the cystic duct cleanly with a 0 PDS Endoloop.  I placed clips on the cystic duct x3.  I completed cystic duct transection. I freed the gallbladder from its remaining attachments to the liver. I ensured hemostasis on the gallbladder fossa of the liver and elsewhere. I inspected the rest of the abdomen & detected no injury nor bleeding elsewhere.  I removed the gallbladder out the supraumbilical fascia.  She had very large stones I had open the fascia to the 3 cm I closed the fascia transversely using  #1 PDS interrupted stitches. I closed the skin using 4-0 monocryl stitch.  Sterile dressing was applied. The patient was extubated & arrived in the PACU in stable condition..  I had discussed postoperative care with the patient in the holding area. I made an attempt to locate family to discuss patient's status and recommendations.  No one is available at this time.    [name], M.D., F.A.C.S. Gastrointestinal and Minimally Invasive Surgery [facility] [address], [place], [place] [address] [phone] Main / Paging  08/17/2017 4:13 PM

## 2017-08-17 NOTE — Anesthesia Preprocedure Evaluation (Signed)
Anesthesia Evaluation  Patient identified by MRN, date of birth, ID band Patient awake    Reviewed: Allergy & Precautions, NPO status , Patient's Chart, lab work & pertinent test results  Airway Mallampati: II  TM Distance: >3 FB Neck ROM: Full    Dental  (+) Teeth Intact, Dental Advisory Given, Chipped,    Pulmonary neg pulmonary ROS,    Pulmonary exam normal breath sounds clear to auscultation       Cardiovascular negative cardio ROS Normal cardiovascular exam Rhythm:Regular Rate:Normal     Neuro/Psych  Headaches, negative psych ROS   GI/Hepatic negative GI ROS, Symptomatic biliary colic probable chronic cholecystitis   Endo/Other  negative endocrine ROS  Renal/GU negative Renal ROS     Musculoskeletal negative musculoskeletal ROS (+)   Abdominal   Peds  Hematology  (+) Blood dyscrasia, anemia ,   Anesthesia Other Findings Day of surgery medications reviewed with the patient.  Reproductive/Obstetrics                             Anesthesia Physical Anesthesia Plan  ASA: II  Anesthesia Plan: General   Post-op Pain Management:    Induction: Intravenous  PONV Risk Score and Plan: 4 or greater and Scopolamine patch - Pre-op, Midazolam, Dexamethasone and Ondansetron  Airway Management Planned: Oral ETT  Additional Equipment:   Intra-op Plan:   Post-operative Plan: Extubation in OR  Informed Consent: I have reviewed the patients History and Physical, chart, labs and discussed the procedure including the risks, benefits and alternatives for the proposed anesthesia with the patient or authorized representative who has indicated his/her understanding and acceptance.   Dental advisory given  Plan Discussed with: CRNA  Anesthesia Plan Comments: (Risks/benefits of general anesthesia discussed with patient including risk of damage to teeth, lips, gum, and tongue, nausea/vomiting,  allergic reactions to medications, and the possibility of heart attack, stroke and death.  All patient questions answered.  Patient wishes to proceed.)        Anesthesia Quick Evaluation

## 2017-08-20 ENCOUNTER — Encounter (HOSPITAL_COMMUNITY): Payer: Self-pay | Admitting: Surgery

## 2017-08-20 NOTE — Anesthesia Postprocedure Evaluation (Signed)
Anesthesia Post Note  Patient: [name]  Procedure(s) Performed: LAPAROSCOPIC CHOLECYSTECTOMY SINGLE SITE WITH INTRAOPERATIVE CHOLANGIOGRAM (N/A Abdomen)     Patient location during evaluation: PACU Anesthesia Type: General Level of consciousness: awake and alert Pain management: pain level controlled Vital Signs Assessment: post-procedure vital signs reviewed and stable Respiratory status: spontaneous breathing, nonlabored ventilation, respiratory function stable and patient connected to nasal cannula oxygen Cardiovascular status: blood pressure returned to baseline and stable Postop Assessment: no apparent nausea or vomiting Anesthetic complications: no    Last Vitals:  Vitals:   08/17/17 1651 08/17/17 1653  BP: (!) 151/109 (!) 160/99  Pulse: 85 82  Resp: 17 19  Temp:  36.7 C  SpO2: 100% 99%    Last Pain:  Vitals:   08/17/17 1653  TempSrc:   PainSc: Asleep                 Thomasenia Dowse

## 2017-08-21 ENCOUNTER — Telehealth: Payer: Self-pay

## 2017-08-21 NOTE — Telephone Encounter (Signed)
Patient scheduled with Dr. Johney Maine at [facility] 08/15/2017

## 2017-10-01 ENCOUNTER — Encounter: Payer: Self-pay | Admitting: Obstetrics and Gynecology

## 2017-10-29 ENCOUNTER — Encounter: Payer: Self-pay | Admitting: Obstetrics and Gynecology

## 2017-11-13 ENCOUNTER — Encounter: Payer: Self-pay | Admitting: Obstetrics and Gynecology

## 2018-05-16 ENCOUNTER — Ambulatory Visit: Payer: 59 | Admitting: Obstetrics and Gynecology

## 2019-02-26 ENCOUNTER — Other Ambulatory Visit: Payer: Self-pay

## 2019-02-26 ENCOUNTER — Emergency Department (HOSPITAL_COMMUNITY)
Admission: EM | Admit: 2019-02-26 | Discharge: 2019-02-26 | Disposition: A | Discharge status: Home / Self Care | Payer: 59 | Attending: Emergency Medicine | Admitting: Emergency Medicine

## 2019-02-26 ENCOUNTER — Encounter (HOSPITAL_COMMUNITY): Payer: Self-pay

## 2019-02-26 DIAGNOSIS — Y999 Unspecified external cause status: Secondary | ICD-10-CM | POA: Insufficient documentation

## 2019-02-26 DIAGNOSIS — R079 Chest pain, unspecified: Secondary | ICD-10-CM | POA: Insufficient documentation

## 2019-02-26 DIAGNOSIS — M25519 Pain in unspecified shoulder: Secondary | ICD-10-CM | POA: Diagnosis not present

## 2019-02-26 DIAGNOSIS — Y9389 Activity, other specified: Secondary | ICD-10-CM | POA: Insufficient documentation

## 2019-02-26 DIAGNOSIS — Z79899 Other long term (current) drug therapy: Secondary | ICD-10-CM | POA: Diagnosis not present

## 2019-02-26 DIAGNOSIS — M542 Cervicalgia: Secondary | ICD-10-CM | POA: Insufficient documentation

## 2019-02-26 DIAGNOSIS — Y92414 Local residential or business street as the place of occurrence of the external cause: Secondary | ICD-10-CM | POA: Insufficient documentation

## 2019-02-26 MED ORDER — LIDOCAINE 5 % EX PTCH: 1.0000 | MEDICATED_PATCH | CUTANEOUS | 0 refills | Status: AC

## 2019-02-26 MED ORDER — METHOCARBAMOL 500 MG PO TABS: 500.0000 mg | ORAL_TABLET | Freq: Two times a day (BID) | ORAL | 0 refills | Status: AC

## 2019-02-26 MED ORDER — DICLOFENAC SODIUM 1 % TD GEL: 4.0000 g | Freq: Four times a day (QID) | TRANSDERMAL | 0 refills | Status: AC
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
## 2019-02-26 NOTE — Discharge Instructions (Addendum)
Expect your soreness to increase over the next 2-3 days. Take it easy, but do not lay around too much as this may make any stiffness worse.  Antiinflammatory medications: Take 600 mg of ibuprofen every 6 hours or 440 mg (over the counter dose) to 500 mg (prescription dose) of naproxen every 12 hours for the next 3 days. After this time, these medications may be used as needed for pain. Take these medications with food to avoid upset stomach. Choose only one of these medications, do not take them together. Acetaminophen (generic for Tylenol): Should you continue to have additional pain while taking the ibuprofen or naproxen, you may add in acetaminophen as needed. Your daily total maximum amount of acetaminophen from all sources should be limited to 4000mg /day for persons without liver problems, or 2000mg /day for those with liver problems. Diclofenac gel: This is a topical anti-inflammatory medication and can be applied directly to the painful region.  Do not use on the face or genitals.  This medication may be used as an alternative to oral anti-inflammatory medications, such as ibuprofen or naproxen. Methocarbamol: Methocarbamol (generic for Robaxin) is a muscle relaxer and can help relieve stiff muscles or muscle spasms.  Do not drive or perform other dangerous activities while taking this medication as it can cause drowsiness as well as changes in reaction time and judgement. Lidocaine patches: These are available via either prescription or over-the-counter. The over-the-counter option may be more economical one and are likely just as effective. There are multiple over-the-counter brands, such as Salonpas. Exercises: Be sure to perform the attached exercises starting with three times a week and working up to performing them daily. This is an essential part of preventing long term problems.  Follow up: Follow up with a primary care provider for any future management of these complaints. Be sure to follow up  within 7-10 days. Return: Return to the ED should symptoms worsen.  For prescription assistance, may try using prescription discount sites or apps, such as goodrx.com
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
## 2020-03-03 ENCOUNTER — Other Ambulatory Visit: Payer: Self-pay

## 2020-03-04 ENCOUNTER — Ambulatory Visit: Payer: No Typology Code available for payment source | Admitting: Nurse Practitioner

## 2020-03-08 ENCOUNTER — Other Ambulatory Visit (HOSPITAL_COMMUNITY): Payer: Self-pay | Admitting: Obstetrics and Gynecology

## 2020-03-08 ENCOUNTER — Other Ambulatory Visit: Payer: Self-pay | Admitting: Obstetrics and Gynecology

## 2020-03-08 DIAGNOSIS — Z1231 Encounter for screening mammogram for malignant neoplasm of breast: Secondary | ICD-10-CM

## 2020-03-11 ENCOUNTER — Ambulatory Visit: Payer: No Typology Code available for payment source

## 2020-03-11 ENCOUNTER — Ambulatory Visit: Payer: 59 | Admitting: Nurse Practitioner

## 2020-03-24 ENCOUNTER — Ambulatory Visit
Admission: RE | Admit: 2020-03-24 | Discharge: 2020-03-24 | Disposition: A | Discharge status: Home / Self Care | Payer: No Typology Code available for payment source | Source: Ambulatory Visit | Attending: Obstetrics and Gynecology | Admitting: Obstetrics and Gynecology

## 2020-03-24 ENCOUNTER — Other Ambulatory Visit: Payer: Self-pay

## 2020-03-24 DIAGNOSIS — Z1231 Encounter for screening mammogram for malignant neoplasm of breast: Secondary | ICD-10-CM

## 2020-03-25 ENCOUNTER — Encounter: Payer: Self-pay | Admitting: Nurse Practitioner

## 2020-03-25 ENCOUNTER — Telehealth: Payer: Self-pay | Admitting: Nurse Practitioner

## 2020-03-25 NOTE — Telephone Encounter (Signed)
Pt was no show for appt 03/04/20. 1st occurrence for new patient appointment. Letter mailed.

## 2020-03-25 NOTE — Telephone Encounter (Signed)
Do not schedule with me if she has another no show. Thank you

## 2020-07-30 ENCOUNTER — Other Ambulatory Visit: Payer: Self-pay

## 2020-07-30 ENCOUNTER — Encounter (HOSPITAL_COMMUNITY): Payer: Self-pay

## 2020-07-30 DIAGNOSIS — Z20822 Contact with and (suspected) exposure to covid-19: Secondary | ICD-10-CM | POA: Insufficient documentation

## 2020-07-30 DIAGNOSIS — J321 Chronic frontal sinusitis: Secondary | ICD-10-CM | POA: Diagnosis not present

## 2020-07-30 DIAGNOSIS — R059 Cough, unspecified: Secondary | ICD-10-CM | POA: Diagnosis present

## 2020-07-30 NOTE — ED Triage Notes (Signed)
Pt reports chills, coughing, mild shob and generalized body aches.

## 2020-07-31 ENCOUNTER — Encounter (HOSPITAL_COMMUNITY): Payer: Self-pay | Admitting: Student

## 2020-07-31 ENCOUNTER — Emergency Department (HOSPITAL_COMMUNITY): Payer: No Typology Code available for payment source

## 2020-07-31 ENCOUNTER — Emergency Department (HOSPITAL_COMMUNITY)
Admission: EM | Admit: 2020-07-31 | Discharge: 2020-07-31 | Disposition: A | Discharge status: Home / Self Care | Payer: No Typology Code available for payment source | Attending: Emergency Medicine | Admitting: Emergency Medicine

## 2020-07-31 DIAGNOSIS — J321 Chronic frontal sinusitis: Secondary | ICD-10-CM

## 2020-07-31 LAB — SARS CORONAVIRUS 2 (TAT 6-24 HRS): SARS Coronavirus 2: NEGATIVE

## 2020-07-31 MED ORDER — AMOXICILLIN-POT CLAVULANATE 875-125 MG PO TABS: 1.0000 | ORAL_TABLET | Freq: Two times a day (BID) | ORAL | 0 refills | Status: AC

## 2020-07-31 MED ORDER — ALBUTEROL SULFATE HFA 108 (90 BASE) MCG/ACT IN AERS
1.0000 | INHALATION_SPRAY | Freq: Once | RESPIRATORY_TRACT | Status: AC
  Administered 2020-07-31: 2 via RESPIRATORY_TRACT
  Filled 2020-07-31: qty 6.7

## 2020-07-31 MED ORDER — BENZONATATE 100 MG PO CAPS: 100.0000 mg | ORAL_CAPSULE | Freq: Three times a day (TID) | ORAL | 0 refills | Status: AC

## 2020-07-31 MED ORDER — DEXAMETHASONE SODIUM PHOSPHATE 10 MG/ML IJ SOLN
10.0000 mg | Freq: Once | INTRAMUSCULAR | Status: AC
  Administered 2020-07-31: 10 mg via INTRAMUSCULAR
  Filled 2020-07-31: qty 1

## 2020-07-31 NOTE — Discharge Instructions (Addendum)
You were seen in the emergency department today for cough and congestion.  Your chest x-ray was normal.  We have tested you for Covid, we will call you if these results are positive you may also view them on my chart once completed.  You were given a shot of steroids in the emergency department to help with your congestion and cough.  We are sending him on Augmentin to treat for a possible bacterial sinus infection, please take this twice per day for the next 7 days.  We have also provided an albuterol inhaler to take 1 to 2 puffs as needed for shortness of breath/wheezing as well as Tessalon to take every 8 hours as needed for coughing.  We have prescribed you new medication(s) today. Discuss the medications prescribed today with your pharmacist as they can have adverse effects and interactions with your other medicines including over the counter and prescribed medications. Seek medical evaluation if you start to experience new or abnormal symptoms after taking one of these medicines, seek care immediately if you start to experience difficulty breathing, feeling of your throat closing, facial swelling, or rash as these could be indications of a more serious allergic reaction  Please follow-up with your primary care provider within 1 week.  Return to the ER for any new or worsening symptoms including but not limited to worsening trouble breathing, passing out, fever, coughing up blood, chest pain, or any other concerns.

## 2020-07-31 NOTE — ED Provider Notes (Signed)
[facility] DEPT Provider Note   CSN: [id number] Arrival date & time: 07/30/20  2243     History Chief Complaint  Patient presents with  . Nasal Congestion  . Cough    Carol Logan is a 45 y.o. female with a hx of anemia who presents to the ED with complaints of congestion x 3 days and cough x 1 week. Patient states she has been sick for 3 weeks with nasal congestion, sinus pressure, throat irritation, fatigue, & body aches, about 1 week ago she developed a cough that is mostly dry, occasionally has post tussive emsis. Feels a bit short of breath intermittently and sometimes as if she is wheezing. Has had negative rapid covid 19 swab. Denies fever, chills, chest pain, abdominal pain,  leg pain/swelling, hemoptysis, recent surgery/trauma, recent long travel, hormone use, personal hx of cancer, or hx of DVT/PE.   HPI     Past Medical History:  Diagnosis Date  . Abnormal uterine bleeding   . Anemia   . Endometriosis   . Fibroid   . Fibroids   . Gallstones   . Headache    with periods  . No pertinent past medical history   . Ovarian cyst   . SVD (spontaneous vaginal delivery)    x 2    Patient Active Problem List   Diagnosis Date Noted  . Chronic calculus cholecystitis s/p lap cholecystectomy 08/17/2017 08/17/2017  . Status post laparoscopic hysterectomy 06/11/2017  . 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 Menorrhagia 09/11/2016  . Routine general medical examination at a health care facility 09/21/2015  . Hyperlipidemia 06/22/2014  . Obesity 06/17/2014  . Uterine leiomyoma 06/17/2014  . Allergic rhinitis 06/17/2014    Past Surgical History:  Procedure Laterality Date  . ABDOMINAL HYSTERECTOMY    . CERVICAL CERCLAGE N/A 04/01/2013   Procedure: CERCLAGE CERVICAL;  Surgeon: [name], MD;  Location: [facility];  Service: Gynecology;  Laterality: N/A;  . CESAREAN SECTION N/A  09/18/2013   Procedure: CESAREAN SECTION;  Surgeon: [name], MD;  Location: [facility];  Service: Obstetrics;  Laterality: N/A;  . CYSTOSCOPY N/A 06/11/2017   Procedure: CYSTOSCOPY;  Surgeon: Nunzio Cobbs, MD;  Location: [facility];  Service: Gynecology;  Laterality: N/A;  . FOOT SURGERY Bilateral    buinonectomy  . FRACTURE SURGERY Right 1992   knee surgery  . LAPAROSCOPIC CHOLECYSTECTOMY SINGLE SITE WITH INTRAOPERATIVE CHOLANGIOGRAM N/A 08/17/2017   Procedure: LAPAROSCOPIC CHOLECYSTECTOMY SINGLE SITE WITH INTRAOPERATIVE CHOLANGIOGRAM;  Surgeon: Michael Boston, MD;  Location: [facility];  Service: General;  Laterality: N/A;  . LYSIS OF ADHESION N/A 06/11/2017   Procedure: LYSIS OF OMENTAL ADHESIONS;  Surgeon: Nunzio Cobbs, MD;  Location: [facility];  Service: Gynecology;  Laterality: N/A;  . MYOMECTOMY  10/11/2011   Procedure: MYOMECTOMY;  Surgeon: [name], MD;  Location: [facility];  Service: Gynecology;  Laterality: N/A;  . TOTAL LAPAROSCOPIC HYSTERECTOMY WITH SALPINGECTOMY Bilateral 06/11/2017   Procedure: TOTAL LAPAROSCOPIC HYSTERECTOMY WITH SALPINGECTOMY;  Surgeon: Nunzio Cobbs, MD;  Location: [facility];  Service: Gynecology;  Laterality: Bilateral;  . TUBAL LIGATION    . WISDOM TOOTH EXTRACTION       OB History    Gravida  4   Para  2   Term  2   Preterm  0   AB  2   Living  2  SAB  0   IAB  2   Ectopic  0   Multiple  0   Live Births  2           Family History  Problem Relation Age of Onset  . High blood pressure Mother   . Hypertension Mother   . Glaucoma Paternal Uncle   . Alzheimer's disease Paternal Uncle   . Hypertension Sister   . Other Sister        GYN complications  . Other Brother        Homicide  . Hypertension Maternal Grandmother   . Heart disease Maternal Grandmother   . Hypertension Paternal Grandmother   . Stroke Paternal Grandmother   . Stroke Paternal Grandfather   . Ovarian cancer Other    . Depression Neg Hx   . Diabetes Neg Hx   . Drug abuse Neg Hx   . Early death Neg Hx   . Alcohol abuse Neg Hx   . Hyperlipidemia Neg Hx   . Kidney disease Neg Hx   . Colon cancer Neg Hx   . Stomach cancer Neg Hx     Social History   Tobacco Use  . Smoking status: Never Smoker  . Smokeless tobacco: Never Used  Vaping Use  . Vaping Use: Never used  Substance Use Topics  . Alcohol use: No    Comment: socially- 1 glass of wine monthly  . Drug use: No    Home Medications Prior to Admission medications   Medication Sig Start Date End Date Taking? Authorizing Provider  diclofenac sodium (VOLTAREN) 1 % GEL Apply 4 g topically 4 (four) times daily. 02/26/19   Joy, Shawn C, PA-C  ibuprofen (ADVIL,MOTRIN) 200 MG tablet Take 400 mg by mouth every 6 (six) hours as needed for moderate pain.    [provider]  lidocaine (LIDODERM) 5 % Place 1 patch onto the skin daily. Remove & Discard patch within 12 hours or as directed by MD 02/26/19   Joy, Shawn C, PA-C  MELATONIN PO Take 1 tablet by mouth at bedtime as needed (sleep).    [provider]  methocarbamol (ROBAXIN) 500 MG tablet Take 1 tablet (500 mg total) by mouth 2 (two) times daily. 02/26/19   Joy, Shawn C, PA-C  Multiple Vitamin (MULTIVITAMIN WITH MINERALS) TABS tablet Take 1 tablet daily by mouth.    [provider]  ondansetron (ZOFRAN) 4 MG tablet Take 1 tablet (4 mg total) by mouth every 8 (eight) hours as needed for nausea or vomiting. 08/05/17   Tamala Julian, [place], CNM  polyethylene glycol powder (GLYCOLAX/MIRALAX) powder Take 17 g by mouth daily as needed for moderate constipation. 08/05/17   Tamala Julian, [place], CNM  prochlorperazine (COMPAZINE) 5 MG tablet Take 1-2 tablets (5-10 mg total) by mouth every 6 (six) hours as needed for refractory nausea / vomiting. 08/17/17   Michael Boston, MD  traMADol (ULTRAM) 50 MG tablet Take 1-2 tablets (50-100 mg total) by mouth every 6 (six) hours as needed for moderate pain or  severe pain. 08/17/17   Michael Boston, MD    Allergies    Patient has no known allergies.  Review of Systems   Review of Systems  Constitutional: Positive for fatigue. Negative for chills and fever.  HENT: Positive for congestion, sinus pressure and sore throat.   Respiratory: Positive for cough, shortness of breath and wheezing.   Cardiovascular: Negative for chest pain.  Gastrointestinal: Positive for vomiting (post tussive). Negative for abdominal pain.  Genitourinary: Negative for dysuria.  Musculoskeletal: Positive for myalgias.  Neurological: Negative for syncope.  All other systems reviewed and are negative.   Physical Exam Updated Vital Signs BP (!) 144/103 (BP Location: Left Arm)   Pulse 81   Temp 98 F (36.7 C) (Oral)   Resp 16   Ht 5\' 5"  (1.651 m)   Wt 79.4 kg   LMP 05/30/2017   SpO2 98%   BMI 29.12 kg/m   Physical Exam Vitals and nursing note reviewed.  Constitutional:      General: She is not in acute distress.    Appearance: She is well-developed.  HENT:     Head: Normocephalic and atraumatic.     Right Ear: Ear canal normal. Tympanic membrane is not perforated, erythematous, retracted or bulging.     Left Ear: Ear canal normal. Tympanic membrane is not perforated, erythematous, retracted or bulging.     Ears:     Comments: No mastoid erythema/swelling/tenderness.     Nose: Congestion present.     Right Sinus: Frontal sinus tenderness present. No maxillary sinus tenderness.     Left Sinus: Frontal sinus tenderness present. No maxillary sinus tenderness.     Mouth/Throat:     Pharynx: Uvula midline. No oropharyngeal exudate or posterior oropharyngeal erythema.     Comments: Posterior oropharynx is symmetric appearing. Patient tolerating own secretions without difficulty. No trismus. No drooling. No hot potato voice. No swelling beneath the tongue, submandibular compartment is soft.  Eyes:     General:        Right eye: No discharge.        Left eye: No  discharge.     Conjunctiva/sclera: Conjunctivae normal.     Pupils: Pupils are equal, round, and reactive to light.  Cardiovascular:     Rate and Rhythm: Normal rate and regular rhythm.     Heart sounds: No murmur heard.   Pulmonary:     Effort: Pulmonary effort is normal. No respiratory distress.     Breath sounds: Normal breath sounds. No wheezing, rhonchi or rales.  Abdominal:     General: There is no distension.     Palpations: Abdomen is soft.     Tenderness: There is no abdominal tenderness.  Musculoskeletal:     Cervical back: Normal range of motion and neck supple. No edema or rigidity.     Right lower leg: No edema.     Left lower leg: No edema.  Lymphadenopathy:     Cervical: No cervical adenopathy.  Skin:    General: Skin is warm and dry.     Findings: No rash.  Neurological:     Mental Status: She is alert.  Psychiatric:        Behavior: Behavior normal.     ED Results / Procedures / Treatments   Labs (all labs ordered are listed, but only abnormal results are displayed) Labs Reviewed  SARS CORONAVIRUS 2 (TAT 6-24 HRS)    EKG None  Radiology DG Chest Portable 1 View  Result Date: 07/31/2020 CLINICAL DATA:  Cough and shortness of breath. EXAM: PORTABLE CHEST 1 VIEW COMPARISON:  None. FINDINGS: The cardiomediastinal contours are normal. The lungs are clear. Pulmonary vasculature is normal. No consolidation, pleural effusion, or pneumothorax. No acute osseous abnormalities are seen. IMPRESSION: Negative portable AP view of the chest. Electronically Signed   By: Keith Rake M.D.   On: 07/31/2020 03:35    Procedures Procedures (including critical care time)  Medications Ordered in ED Medications  dexamethasone (DECADRON) injection 10 mg (has no administration in time range)  albuterol (VENTOLIN HFA) 108 (90 Base) MCG/ACT inhaler 1-2 puff (has no administration in time range)    ED Course  I have reviewed the triage vital signs and the nursing  notes.  Pertinent labs & imaging results that were available during my care of the patient were reviewed by me and considered in my medical decision making (see chart for details).    MDM Rules/Calculators/A&P                         Patient presents to the ED with complaints of congestion x 3 weeks and cough x 1 week.  Nontoxic, vitals WNL with the exception of elevated BP- doubt HTN emergency.   Additional history obtained:  Additional history obtained from chart review/nurisng note review.   Imaging Studies ordered:  I ordered imaging studies which included CXR, I independently visualized and interpreted imaging which showed no acute process.   Exam is without signs of AOM, AOE, or mastoiditis. Oropharyngeal exam is benign, doubt strep, no signs of RPA/PTA. Marland Kitchen No meningeal signs. Lungs are CTA without focal adventitious sounds, no signs of increased work of breathing, CXR without infiltrate  doubt CAP. Abdomen nontender w/o peritoneal signs. PERC negative- doubt PE.   Patient without wheezing on exam, no signs of respiratory distress, given reported wheezing will give albuterol inhaler to use as needed. Concern for possible bacterial sinusitis given sxs x 3 weeks w/ sinus tenderness. Will give IM decadron to help with cough/sinuses and start augmentin. COVID testing obtained as well. I discussed results, treatment plan, need for follow-up, and return precautions with the patient. Provided opportunity for questions, patient confirmed understanding and is in agreement with plan.    Portions of this note were generated with [employer]. Dictation errors may occur despite best attempts at proofreading.  Portions of this note were generated with [employer]. Dictation errors may occur despite best attempts at proofreading.  Final Clinical Impression(s) / ED Diagnoses Final diagnoses:  Frontal sinusitis, unspecified chronicity    Rx / DC Orders ED Discharge  Orders         Ordered    amoxicillin-clavulanate (AUGMENTIN) 875-125 MG tablet  Every 12 hours        07/31/20 0355    benzonatate (TESSALON) 100 MG capsule  Every 8 hours        07/31/20 0355           Priyah Schmuck, Glynda Jaeger, PA-C 07/31/20 0423    Orpah Greek, MD 07/31/20 [phone]

## 2021-10-10 ENCOUNTER — Other Ambulatory Visit: Payer: Self-pay | Admitting: Endocrinology

## 2021-10-10 DIAGNOSIS — Z1231 Encounter for screening mammogram for malignant neoplasm of breast: Secondary | ICD-10-CM

## 2021-11-03 ENCOUNTER — Ambulatory Visit: Payer: No Typology Code available for payment source

## 2021-11-08 ENCOUNTER — Ambulatory Visit
Admission: RE | Admit: 2021-11-08 | Discharge: 2021-11-08 | Disposition: A | Discharge status: Home / Self Care | Payer: BC Managed Care – PPO | Source: Ambulatory Visit | Attending: Endocrinology | Admitting: Endocrinology

## 2021-11-08 DIAGNOSIS — Z1231 Encounter for screening mammogram for malignant neoplasm of breast: Secondary | ICD-10-CM

## 2021-12-27 ENCOUNTER — Encounter: Payer: Self-pay | Admitting: *Deleted

## 2022-08-23 IMAGING — MG MM DIGITAL SCREENING BILAT W/ TOMO AND CAD
8 series · 8 of 24 positions shown · non-contrast
Comparison: Previous exam(s).

CLINICAL DATA: Screening.

EXAM:
DIGITAL SCREENING BILATERAL MAMMOGRAM WITH TOMOSYNTHESIS AND CAD
TECHNIQUE: Bilateral screening digital craniocaudal and mediolateral oblique
mammograms were obtained. Bilateral screening digital breast
tomosynthesis was performed. The images were evaluated with
computer-aided detection.

[R MLO synth-2D]
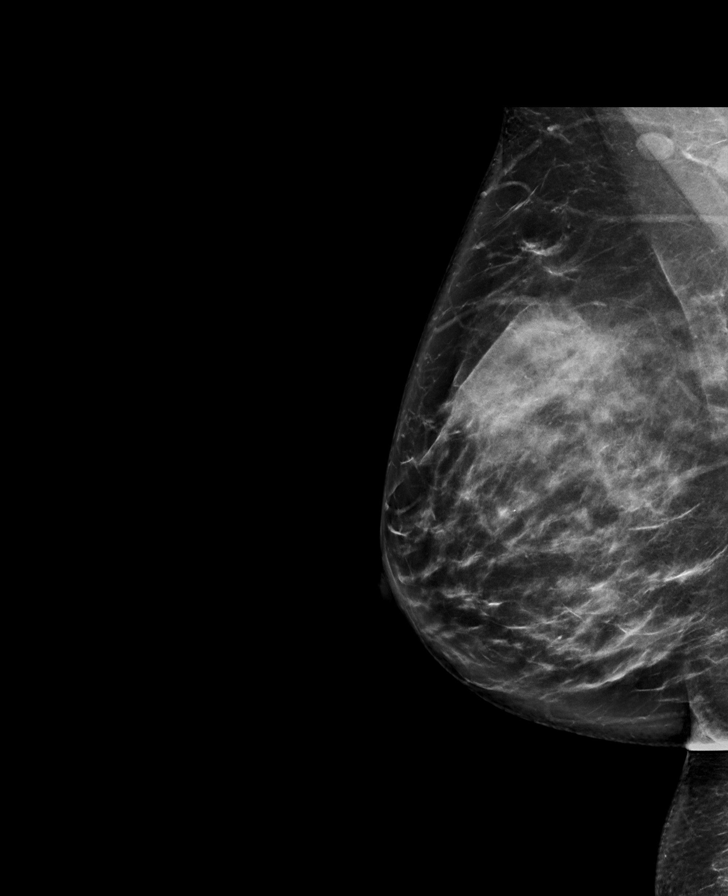

[L CC synth-2D]
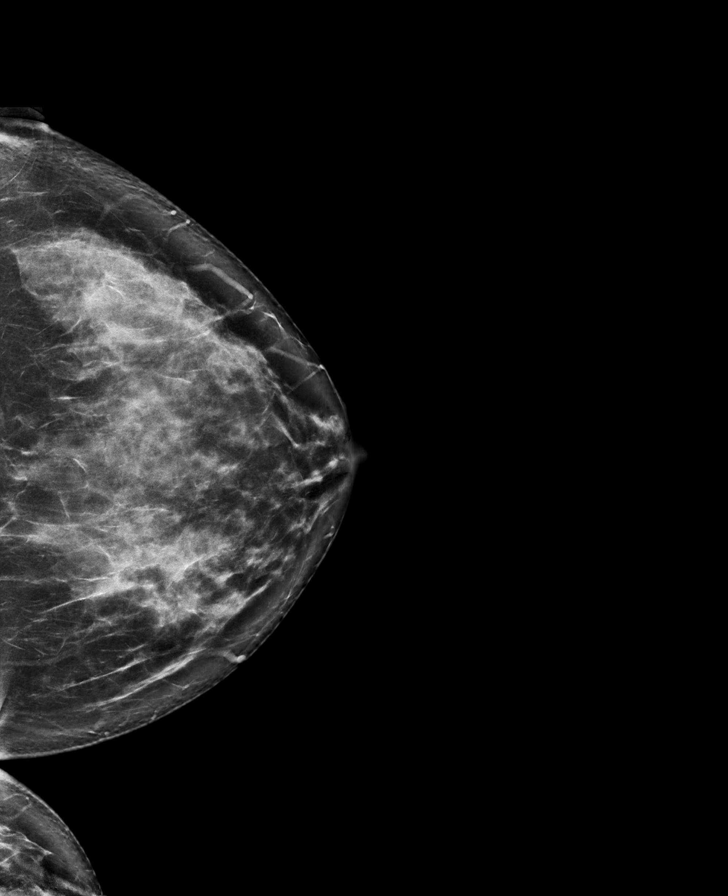

[R CC synth-2D]
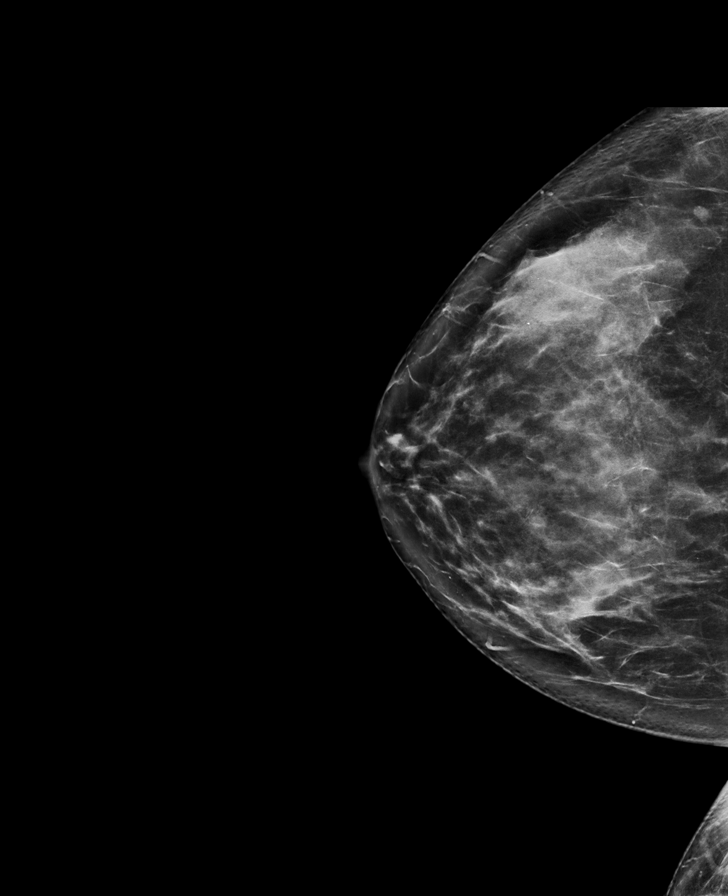

[L MLO synth-2D]
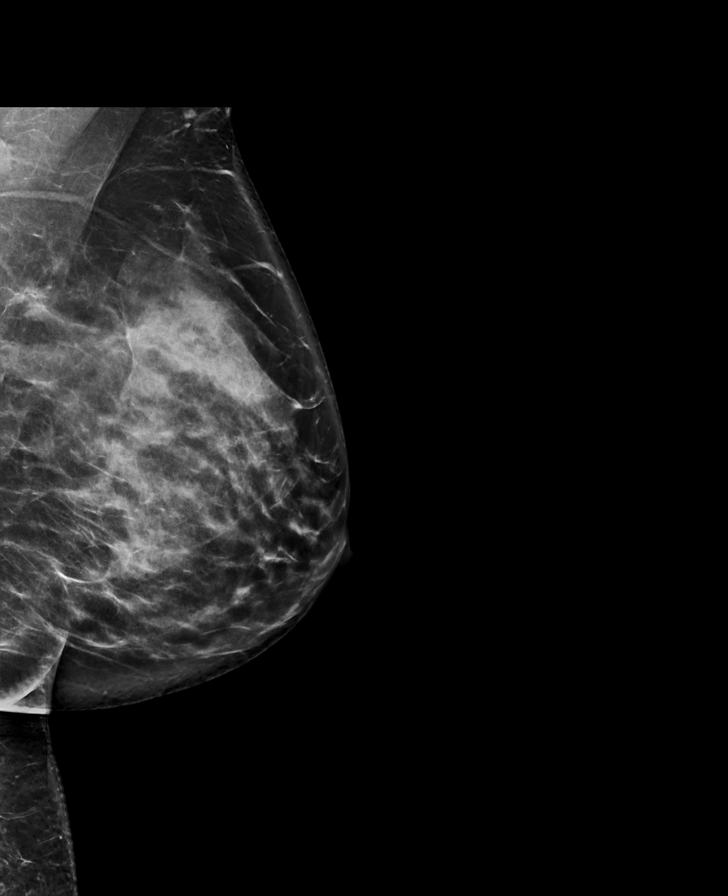

[L MLO tomo · tomo slice 47/94.0]
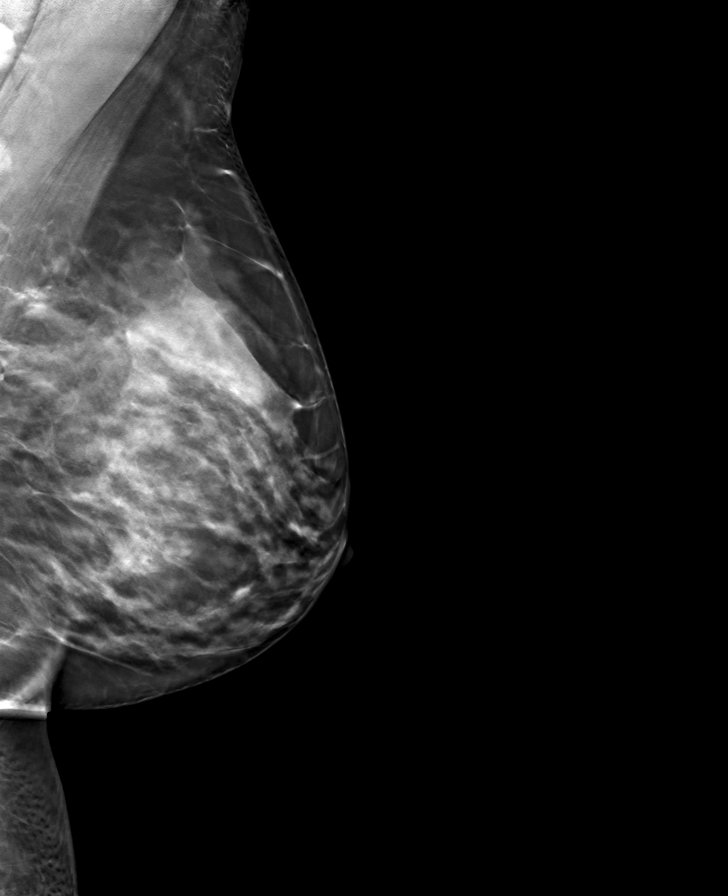

[R CC tomo · tomo slice 43/86.0]
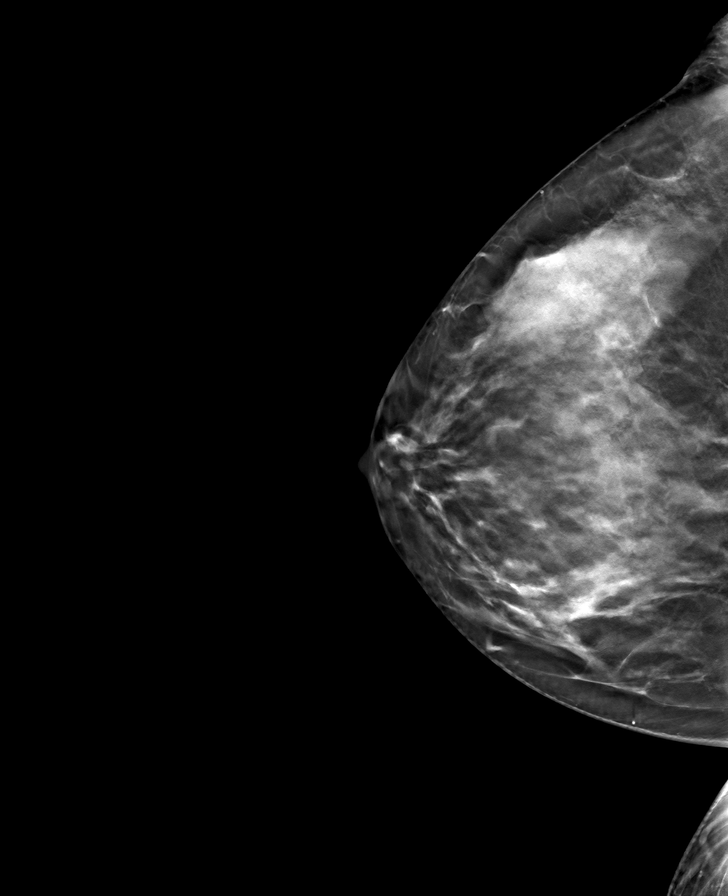

[R MLO tomo · tomo slice 48/95.0]
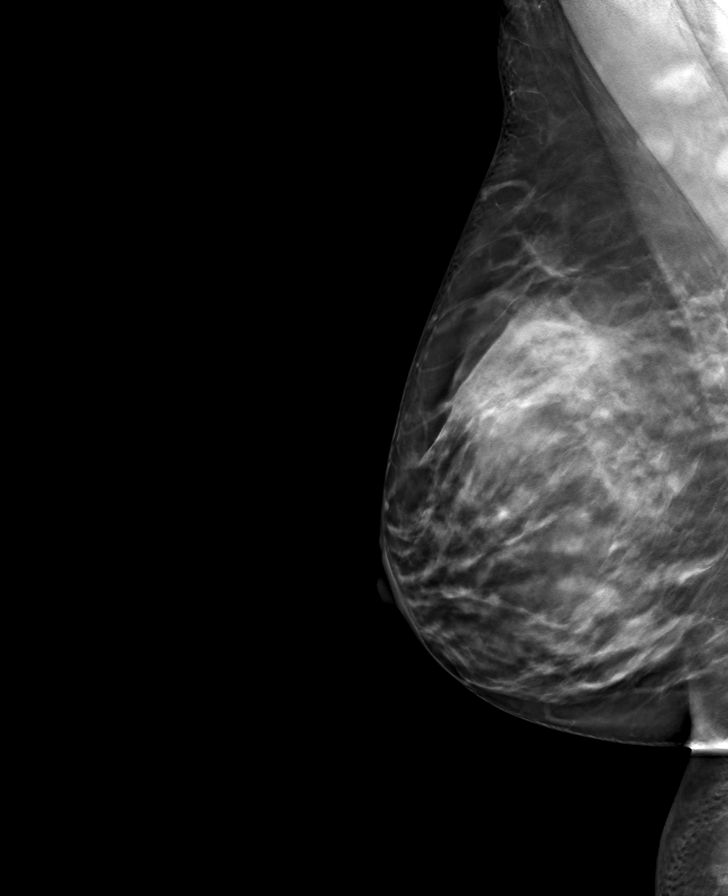

[L CC tomo · tomo slice 46/91.0]
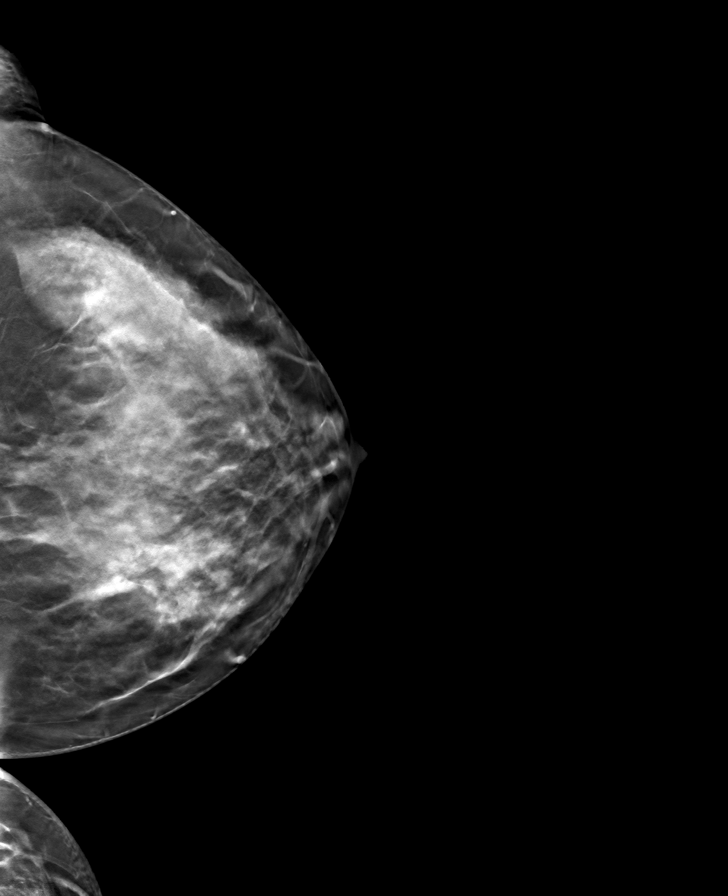

[8 of 24 positions shown; findings below may reference images not displayed]

ACR Breast Density Category d: The breast tissue is extremely dense,
which lowers the sensitivity of mammography
FINDINGS: There are no findings suspicious for malignancy.
IMPRESSION: No mammographic evidence of malignancy. A result letter of this
screening mammogram will be mailed directly to the patient.

RECOMMENDATION:
Screening mammogram in one year. (Code:TA-V-WV9)

BI-RADS CATEGORY  1: Negative.

## 2024-04-03 ENCOUNTER — Other Ambulatory Visit (HOSPITAL_COMMUNITY): Payer: Self-pay | Admitting: Nurse Practitioner

## 2024-04-03 DIAGNOSIS — Z1231 Encounter for screening mammogram for malignant neoplasm of breast: Secondary | ICD-10-CM

## 2024-04-07 ENCOUNTER — Encounter (HOSPITAL_COMMUNITY): Payer: Self-pay

## 2024-04-07 ENCOUNTER — Ambulatory Visit (HOSPITAL_COMMUNITY)
Admission: RE | Admit: 2024-04-07 | Discharge: 2024-04-07 | Disposition: A | Discharge status: Home / Self Care | Source: Ambulatory Visit | Attending: Nurse Practitioner | Admitting: Nurse Practitioner

## 2024-04-07 DIAGNOSIS — Z1231 Encounter for screening mammogram for malignant neoplasm of breast: Secondary | ICD-10-CM | POA: Diagnosis present

## 2024-04-10 ENCOUNTER — Encounter (HOSPITAL_COMMUNITY): Payer: Self-pay | Admitting: Nurse Practitioner

## 2024-04-10 DIAGNOSIS — R928 Other abnormal and inconclusive findings on diagnostic imaging of breast: Secondary | ICD-10-CM

## 2024-04-14 ENCOUNTER — Encounter (HOSPITAL_COMMUNITY): Payer: Self-pay | Admitting: Nurse Practitioner

## 2024-04-21 ENCOUNTER — Other Ambulatory Visit (HOSPITAL_COMMUNITY): Payer: Self-pay | Admitting: Nurse Practitioner

## 2024-04-21 DIAGNOSIS — R928 Other abnormal and inconclusive findings on diagnostic imaging of breast: Secondary | ICD-10-CM

## 2024-04-22 ENCOUNTER — Ambulatory Visit (HOSPITAL_COMMUNITY)
Admission: RE | Admit: 2024-04-22 | Discharge: 2024-04-22 | Disposition: A | Discharge status: Home / Self Care | Source: Ambulatory Visit | Attending: Nurse Practitioner | Admitting: Nurse Practitioner

## 2024-04-22 ENCOUNTER — Ambulatory Visit (HOSPITAL_COMMUNITY)
Admission: RE | Admit: 2024-04-22 | Discharge: 2024-04-22 | Attending: Nurse Practitioner | Admitting: Nurse Practitioner

## 2024-04-22 ENCOUNTER — Encounter (HOSPITAL_COMMUNITY): Payer: Self-pay

## 2024-04-22 DIAGNOSIS — R928 Other abnormal and inconclusive findings on diagnostic imaging of breast: Secondary | ICD-10-CM | POA: Insufficient documentation
